# Patient Record
Sex: Female | Born: 1960 | Race: Black or African American | Hispanic: No | Marital: Married | Smoking: Never smoker
Health system: Southern US, Community
[De-identification: ages and names within clinical notes are randomized; demographics above are authoritative.]

## PROBLEM LIST (undated history)

## (undated) DIAGNOSIS — F32A Depression, unspecified: Secondary | ICD-10-CM

## (undated) DIAGNOSIS — G500 Trigeminal neuralgia: Secondary | ICD-10-CM

## (undated) DIAGNOSIS — I10 Essential (primary) hypertension: Secondary | ICD-10-CM

## (undated) DIAGNOSIS — R519 Headache, unspecified: Secondary | ICD-10-CM

## (undated) DIAGNOSIS — D649 Anemia, unspecified: Secondary | ICD-10-CM

## (undated) DIAGNOSIS — F329 Major depressive disorder, single episode, unspecified: Secondary | ICD-10-CM

## (undated) DIAGNOSIS — K219 Gastro-esophageal reflux disease without esophagitis: Secondary | ICD-10-CM

## (undated) DIAGNOSIS — E079 Disorder of thyroid, unspecified: Secondary | ICD-10-CM

## (undated) HISTORY — PX: INDUCED ABORTION: SHX677

## (undated) HISTORY — DX: Major depressive disorder, single episode, unspecified: F32.9

## (undated) HISTORY — DX: Headache, unspecified: R51.9

## (undated) HISTORY — DX: Essential (primary) hypertension: I10

## (undated) HISTORY — PX: TONSILLECTOMY: SHX5217

## (undated) HISTORY — DX: Disorder of thyroid, unspecified: E07.9

## (undated) HISTORY — DX: Depression, unspecified: F32.A

## (undated) HISTORY — DX: Gastro-esophageal reflux disease without esophagitis: K21.9

## (undated) HISTORY — DX: Anemia, unspecified: D64.9

## (undated) HISTORY — DX: Trigeminal neuralgia: G50.0

---

## 1998-01-19 ENCOUNTER — Ambulatory Visit (HOSPITAL_COMMUNITY): Admission: RE | Admit: 1998-01-19 | Discharge: 1998-01-19 | Payer: Self-pay | Admitting: Family Medicine

## 1998-02-21 ENCOUNTER — Other Ambulatory Visit: Admission: RE | Admit: 1998-02-21 | Discharge: 1998-02-21 | Payer: Self-pay | Admitting: Family Medicine

## 1998-09-18 ENCOUNTER — Other Ambulatory Visit: Admission: RE | Admit: 1998-09-18 | Discharge: 1998-09-18 | Payer: Self-pay | Admitting: Family Medicine

## 2000-05-21 ENCOUNTER — Other Ambulatory Visit: Admission: RE | Admit: 2000-05-21 | Discharge: 2000-05-21 | Payer: Self-pay | Admitting: Family Medicine

## 2001-10-20 ENCOUNTER — Other Ambulatory Visit: Admission: RE | Admit: 2001-10-20 | Discharge: 2001-10-20 | Payer: Self-pay | Admitting: Obstetrics and Gynecology

## 2001-11-09 ENCOUNTER — Encounter: Admission: RE | Admit: 2001-11-09 | Discharge: 2001-12-16 | Payer: Self-pay | Admitting: Internal Medicine

## 2003-02-27 ENCOUNTER — Other Ambulatory Visit: Admission: RE | Admit: 2003-02-27 | Discharge: 2003-02-27 | Payer: Self-pay | Admitting: Family Medicine

## 2003-06-10 ENCOUNTER — Ambulatory Visit (HOSPITAL_COMMUNITY): Admission: RE | Admit: 2003-06-10 | Discharge: 2003-06-10 | Payer: Self-pay | Admitting: Emergency Medicine

## 2003-06-10 ENCOUNTER — Emergency Department (HOSPITAL_COMMUNITY): Admission: EM | Admit: 2003-06-10 | Discharge: 2003-06-10 | Payer: Self-pay | Admitting: Emergency Medicine

## 2003-10-02 ENCOUNTER — Emergency Department (HOSPITAL_COMMUNITY): Admission: EM | Admit: 2003-10-02 | Discharge: 2003-10-03 | Payer: Self-pay | Admitting: Emergency Medicine

## 2004-04-23 ENCOUNTER — Other Ambulatory Visit: Admission: RE | Admit: 2004-04-23 | Discharge: 2004-04-23 | Payer: Self-pay | Admitting: Family Medicine

## 2004-12-11 ENCOUNTER — Ambulatory Visit: Payer: Self-pay | Admitting: Family Medicine

## 2004-12-11 ENCOUNTER — Other Ambulatory Visit: Admission: RE | Admit: 2004-12-11 | Discharge: 2004-12-11 | Payer: Self-pay | Admitting: Family Medicine

## 2006-01-15 ENCOUNTER — Other Ambulatory Visit: Admission: RE | Admit: 2006-01-15 | Discharge: 2006-01-15 | Payer: Self-pay | Admitting: Family Medicine

## 2006-01-15 ENCOUNTER — Ambulatory Visit: Payer: Self-pay | Admitting: Family Medicine

## 2006-01-15 ENCOUNTER — Encounter: Payer: Self-pay | Admitting: Family Medicine

## 2006-01-27 ENCOUNTER — Ambulatory Visit: Payer: Self-pay | Admitting: Family Medicine

## 2006-02-05 ENCOUNTER — Ambulatory Visit: Payer: Self-pay | Admitting: Family Medicine

## 2006-08-05 ENCOUNTER — Ambulatory Visit: Payer: Self-pay | Admitting: Internal Medicine

## 2006-08-10 ENCOUNTER — Ambulatory Visit: Payer: Self-pay | Admitting: Internal Medicine

## 2007-04-14 ENCOUNTER — Emergency Department (HOSPITAL_COMMUNITY): Admission: EM | Admit: 2007-04-14 | Discharge: 2007-04-14 | Payer: Self-pay | Admitting: Emergency Medicine

## 2007-11-25 ENCOUNTER — Ambulatory Visit: Payer: Self-pay | Admitting: Family Medicine

## 2007-11-25 ENCOUNTER — Encounter: Payer: Self-pay | Admitting: Family Medicine

## 2007-11-25 ENCOUNTER — Other Ambulatory Visit: Admission: RE | Admit: 2007-11-25 | Discharge: 2007-11-25 | Payer: Self-pay | Admitting: Family Medicine

## 2007-11-25 DIAGNOSIS — G500 Trigeminal neuralgia: Secondary | ICD-10-CM | POA: Insufficient documentation

## 2007-11-25 DIAGNOSIS — R002 Palpitations: Secondary | ICD-10-CM | POA: Insufficient documentation

## 2007-11-26 ENCOUNTER — Encounter (INDEPENDENT_AMBULATORY_CARE_PROVIDER_SITE_OTHER): Payer: Self-pay | Admitting: *Deleted

## 2007-11-30 ENCOUNTER — Encounter (INDEPENDENT_AMBULATORY_CARE_PROVIDER_SITE_OTHER): Payer: Self-pay | Admitting: *Deleted

## 2007-12-01 ENCOUNTER — Ambulatory Visit: Payer: Self-pay

## 2007-12-09 ENCOUNTER — Encounter: Payer: Self-pay | Admitting: Family Medicine

## 2007-12-16 ENCOUNTER — Ambulatory Visit: Payer: Self-pay | Admitting: Family Medicine

## 2007-12-16 LAB — CONVERTED CEMR LAB
Basophils Relative: 0.5 % (ref 0.0–1.0)
Eosinophils Relative: 5.2 % — ABNORMAL HIGH (ref 0.0–5.0)
HCT: 32.9 % — ABNORMAL LOW (ref 36.0–46.0)
Hemoglobin: 11.2 g/dL — ABNORMAL LOW (ref 12.0–15.0)
Lymphocytes Relative: 31.1 % (ref 12.0–46.0)
MCHC: 34 g/dL (ref 30.0–36.0)
MCV: 87.5 fL (ref 78.0–100.0)
Monocytes Relative: 9.8 % (ref 3.0–12.0)
Neutrophils Relative %: 53.4 % (ref 43.0–77.0)
Platelets: 103 10*3/uL — ABNORMAL LOW (ref 150–400)
RBC: 3.76 M/uL — ABNORMAL LOW (ref 3.87–5.11)
RDW: 13.1 % (ref 11.5–14.6)
WBC: 3.3 10*3/uL — ABNORMAL LOW (ref 4.5–10.5)

## 2007-12-22 ENCOUNTER — Encounter: Payer: Self-pay | Admitting: Internal Medicine

## 2007-12-22 ENCOUNTER — Telehealth (INDEPENDENT_AMBULATORY_CARE_PROVIDER_SITE_OTHER): Payer: Self-pay | Admitting: *Deleted

## 2007-12-28 ENCOUNTER — Encounter (INDEPENDENT_AMBULATORY_CARE_PROVIDER_SITE_OTHER): Payer: Self-pay | Admitting: *Deleted

## 2007-12-30 ENCOUNTER — Telehealth (INDEPENDENT_AMBULATORY_CARE_PROVIDER_SITE_OTHER): Payer: Self-pay | Admitting: *Deleted

## 2008-01-04 ENCOUNTER — Ambulatory Visit: Payer: Self-pay | Admitting: Internal Medicine

## 2008-01-07 ENCOUNTER — Encounter: Payer: Self-pay | Admitting: Internal Medicine

## 2008-01-08 LAB — CONVERTED CEMR LAB
Basophils Relative: 1.4 % — ABNORMAL HIGH (ref 0.0–1.0)
Eosinophils Relative: 5.4 % — ABNORMAL HIGH (ref 0.0–5.0)
Folate: 12 ng/mL
HCT: 33.8 % — ABNORMAL LOW (ref 36.0–46.0)
Hemoglobin: 11 g/dL — ABNORMAL LOW (ref 12.0–15.0)
Iron: 105 ug/dL (ref 42–145)
Lymphocytes Relative: 30.9 % (ref 12.0–46.0)
MCHC: 32.5 g/dL (ref 30.0–36.0)
MCV: 88 fL (ref 78.0–100.0)
Monocytes Relative: 12.2 % — ABNORMAL HIGH (ref 3.0–12.0)
Neutrophils Relative %: 50.1 % (ref 43.0–77.0)
Platelets: 132 10*3/uL — ABNORMAL LOW (ref 150–400)
RBC: 3.84 M/uL — ABNORMAL LOW (ref 3.87–5.11)
RDW: 12.8 % (ref 11.5–14.6)
Saturation Ratios: 35.2 % (ref 20.0–50.0)
Transferrin: 213.3 mg/dL (ref >=212.0)
Vitamin B-12: 320 pg/mL (ref 211–911)
WBC: 3.3 10*3/uL — ABNORMAL LOW (ref 4.5–10.5)

## 2008-01-10 ENCOUNTER — Encounter (INDEPENDENT_AMBULATORY_CARE_PROVIDER_SITE_OTHER): Payer: Self-pay | Admitting: *Deleted

## 2008-01-11 ENCOUNTER — Encounter (INDEPENDENT_AMBULATORY_CARE_PROVIDER_SITE_OTHER): Payer: Self-pay | Admitting: *Deleted

## 2008-01-11 ENCOUNTER — Ambulatory Visit: Payer: Self-pay | Admitting: Internal Medicine

## 2008-01-11 LAB — CONVERTED CEMR LAB
OCCULT 1: NEGATIVE
OCCULT 2: NEGATIVE
OCCULT 3: NEGATIVE

## 2008-01-17 ENCOUNTER — Encounter (INDEPENDENT_AMBULATORY_CARE_PROVIDER_SITE_OTHER): Payer: Self-pay | Admitting: *Deleted

## 2008-01-21 ENCOUNTER — Ambulatory Visit: Payer: Self-pay | Admitting: Family Medicine

## 2008-01-21 ENCOUNTER — Telehealth (INDEPENDENT_AMBULATORY_CARE_PROVIDER_SITE_OTHER): Payer: Self-pay | Admitting: *Deleted

## 2008-01-21 DIAGNOSIS — D7289 Other specified disorders of white blood cells: Secondary | ICD-10-CM

## 2008-01-21 LAB — CONVERTED CEMR LAB
Basophils Absolute: 0 10*3/uL (ref 0.0–0.1)
Basophils Relative: 0.3 % (ref 0.0–1.0)
Eosinophils Absolute: 0.1 10*3/uL (ref 0.0–0.7)
Eosinophils Relative: 3.7 % (ref 0.0–5.0)
HCT: 34 % — ABNORMAL LOW (ref 36.0–46.0)
Hemoglobin: 11.2 g/dL — ABNORMAL LOW (ref 12.0–15.0)
Lymphocytes Relative: 31 % (ref 12.0–46.0)
MCHC: 33 g/dL (ref 30.0–36.0)
MCV: 87.4 fL (ref 78.0–100.0)
Monocytes Absolute: 0.3 10*3/uL (ref 0.1–1.0)
Monocytes Relative: 11.1 % (ref 3.0–12.0)
Neutro Abs: 1.5 10*3/uL (ref 1.4–7.7)
Neutrophils Relative %: 53.9 % (ref 43.0–77.0)
Platelets: 121 10*3/uL — ABNORMAL LOW (ref 150–400)
RBC: 3.89 M/uL (ref 3.87–5.11)
RDW: 12.4 % (ref 11.5–14.6)
WBC: 2.7 10*3/uL — ABNORMAL LOW (ref 4.5–10.5)

## 2008-01-27 ENCOUNTER — Telehealth (INDEPENDENT_AMBULATORY_CARE_PROVIDER_SITE_OTHER): Payer: Self-pay | Admitting: *Deleted

## 2008-02-21 ENCOUNTER — Ambulatory Visit: Payer: Self-pay | Admitting: Hematology

## 2008-02-21 ENCOUNTER — Telehealth (INDEPENDENT_AMBULATORY_CARE_PROVIDER_SITE_OTHER): Payer: Self-pay | Admitting: *Deleted

## 2008-03-15 ENCOUNTER — Encounter: Payer: Self-pay | Admitting: Family Medicine

## 2008-03-15 LAB — CBC & DIFF AND RETIC
Basophils Absolute: 0 10*3/uL (ref 0.0–0.1)
Eosinophils Absolute: 0.1 10*3/uL (ref 0.0–0.5)
HCT: 34.2 % — ABNORMAL LOW (ref 34.8–46.6)
HGB: 11.7 g/dL (ref 11.6–15.9)
LYMPH%: 26 % (ref 14.0–48.0)
MCV: 86 fL (ref 81.0–101.0)
MONO%: 8.9 % (ref 0.0–13.0)
NEUT#: 2 10*3/uL (ref 1.5–6.5)
NEUT%: 62.3 % (ref 39.6–76.8)
Platelets: 158 10*3/uL (ref 145–400)
RBC: 3.98 10*6/uL (ref 3.70–5.32)

## 2008-03-15 LAB — MORPHOLOGY: PLT EST: ADEQUATE

## 2008-03-17 LAB — IRON AND TIBC
%SAT: 22 % (ref 20–55)
TIBC: 290 ug/dL (ref 250–470)
UIBC: 227 ug/dL

## 2008-03-17 LAB — COMPREHENSIVE METABOLIC PANEL
Albumin: 4.3 g/dL (ref 3.5–5.2)
BUN: 14 mg/dL (ref 6–23)
CO2: 24 mEq/L (ref 19–32)
Calcium: 8.7 mg/dL (ref 8.4–10.5)
Chloride: 106 mEq/L (ref 96–112)
Glucose, Bld: 85 mg/dL (ref 70–99)
Potassium: 3.8 mEq/L (ref 3.5–5.3)
Total Protein: 7.3 g/dL (ref 6.0–8.3)

## 2008-03-17 LAB — APTT: aPTT: 32 seconds (ref 24–37)

## 2008-03-17 LAB — SPEP & IFE WITH QIG
Alpha-1-Globulin: 2.9 % (ref 2.9–4.9)
Alpha-2-Globulin: 8.4 % (ref 7.1–11.8)
Beta Globulin: 5.1 % (ref 4.7–7.2)
Gamma Globulin: 22.3 % — ABNORMAL HIGH (ref 11.1–18.8)
IgG (Immunoglobin G), Serum: 1610 mg/dL (ref 694–1618)

## 2008-03-17 LAB — ANA: Anti Nuclear Antibody(ANA): NEGATIVE

## 2008-03-17 LAB — VITAMIN B12: Vitamin B-12: 346 pg/mL (ref 211–911)

## 2008-03-17 LAB — PROTHROMBIN TIME
INR: 1.1 (ref 0.0–1.5)
Prothrombin Time: 14.5 seconds (ref 11.6–15.2)

## 2008-03-17 LAB — FOLATE: Folate: >20 ng/mL

## 2008-05-11 ENCOUNTER — Ambulatory Visit: Payer: Self-pay | Admitting: Hematology

## 2008-05-15 LAB — CBC WITH DIFFERENTIAL/PLATELET
Basophils Absolute: 0 10*3/uL (ref 0.0–0.1)
Eosinophils Absolute: 0.1 10*3/uL (ref 0.0–0.5)
HCT: 35.4 % (ref 34.8–46.6)
HGB: 12 g/dL (ref 11.6–15.9)
MCV: 86.5 fL (ref 81.0–101.0)
NEUT#: 1.5 10*3/uL (ref 1.5–6.5)
NEUT%: 51.8 % (ref 39.6–76.8)
RDW: 13.9 % (ref 11.3–14.5)
lymph#: 0.9 10*3/uL (ref 0.9–3.3)

## 2008-05-15 LAB — MORPHOLOGY: PLT EST: DECREASED

## 2008-07-13 ENCOUNTER — Ambulatory Visit: Payer: Self-pay | Admitting: Hematology

## 2008-07-20 ENCOUNTER — Encounter: Payer: Self-pay | Admitting: Family Medicine

## 2008-09-12 ENCOUNTER — Ambulatory Visit: Payer: Self-pay | Admitting: Hematology

## 2008-11-27 ENCOUNTER — Ambulatory Visit: Payer: Self-pay | Admitting: Family Medicine

## 2008-11-28 ENCOUNTER — Encounter: Payer: Self-pay | Admitting: Family Medicine

## 2008-12-05 ENCOUNTER — Encounter: Payer: Self-pay | Admitting: Family Medicine

## 2008-12-05 ENCOUNTER — Other Ambulatory Visit: Admission: RE | Admit: 2008-12-05 | Discharge: 2008-12-05 | Payer: Self-pay | Admitting: Family Medicine

## 2008-12-05 ENCOUNTER — Ambulatory Visit: Payer: Self-pay | Admitting: Family Medicine

## 2008-12-05 LAB — CONVERTED CEMR LAB
Bilirubin Urine: NEGATIVE
Blood in Urine, dipstick: NEGATIVE
Glucose, Urine, Semiquant: NEGATIVE
Ketones, urine, test strip: NEGATIVE
Nitrite: NEGATIVE
Protein, U semiquant: NEGATIVE
Specific Gravity, Urine: <1.005
Urobilinogen, UA: NEGATIVE
WBC Urine, dipstick: NEGATIVE
pH: 7.5

## 2008-12-06 ENCOUNTER — Telehealth (INDEPENDENT_AMBULATORY_CARE_PROVIDER_SITE_OTHER): Payer: Self-pay | Admitting: *Deleted

## 2008-12-06 ENCOUNTER — Encounter (INDEPENDENT_AMBULATORY_CARE_PROVIDER_SITE_OTHER): Payer: Self-pay | Admitting: *Deleted

## 2008-12-06 LAB — CONVERTED CEMR LAB
ALT: 20 units/L (ref 0–35)
AST: 19 units/L (ref 0–37)
Albumin: 3.9 g/dL (ref 3.5–5.2)
Alkaline Phosphatase: 31 units/L — ABNORMAL LOW (ref 39–117)
BUN: 11 mg/dL (ref 6–23)
Basophils Absolute: 0 10*3/uL (ref 0.0–0.1)
Basophils Relative: 0.1 % (ref 0.0–3.0)
Bilirubin, Direct: 0.1 mg/dL (ref 0.0–0.3)
CO2: 32 meq/L (ref 19–32)
Calcium: 8.8 mg/dL (ref 8.4–10.5)
Chloride: 105 meq/L (ref 96–112)
Cholesterol: 160 mg/dL (ref 0–200)
Creatinine, Ser: 0.9 mg/dL (ref 0.4–1.2)
Eosinophils Absolute: 0.2 10*3/uL (ref 0.0–0.7)
Eosinophils Relative: 6.1 % — ABNORMAL HIGH (ref 0.0–5.0)
GFR calc non Af Amer: 85.49 mL/min (ref >60)
Glucose, Bld: 77 mg/dL (ref 70–99)
HCT: 34.6 % — ABNORMAL LOW (ref 36.0–46.0)
HDL: 56.7 mg/dL (ref >39.00)
Hemoglobin: 11.9 g/dL — ABNORMAL LOW (ref 12.0–15.0)
LDL Cholesterol: 94 mg/dL (ref 0–99)
Lymphocytes Relative: 27.1 % (ref 12.0–46.0)
Lymphs Abs: 0.9 10*3/uL (ref 0.7–4.0)
MCHC: 34.4 g/dL (ref 30.0–36.0)
MCV: 86.5 fL (ref 78.0–100.0)
Monocytes Absolute: 0.3 10*3/uL (ref 0.1–1.0)
Monocytes Relative: 10.4 % (ref 3.0–12.0)
Neutro Abs: 1.8 10*3/uL (ref 1.4–7.7)
Neutrophils Relative %: 56.3 % (ref 43.0–77.0)
Platelets: 117 10*3/uL — ABNORMAL LOW (ref 150.0–400.0)
Potassium: 3.7 meq/L (ref 3.5–5.1)
RBC: 4 M/uL (ref 3.87–5.11)
RDW: 12.9 % (ref 11.5–14.6)
Sodium: 141 meq/L (ref 135–145)
TSH: 0.4 microintl units/mL (ref 0.35–5.50)
Total Bilirubin: 0.9 mg/dL (ref 0.3–1.2)
Total CHOL/HDL Ratio: 3
Total Protein: 7.3 g/dL (ref 6.0–8.3)
Triglycerides: 48 mg/dL (ref 0.0–149.0)
VLDL: 9.6 mg/dL (ref 0.0–40.0)
WBC: 3.2 10*3/uL — ABNORMAL LOW (ref 4.5–10.5)

## 2008-12-07 ENCOUNTER — Encounter (INDEPENDENT_AMBULATORY_CARE_PROVIDER_SITE_OTHER): Payer: Self-pay | Admitting: *Deleted

## 2008-12-12 ENCOUNTER — Encounter: Payer: Self-pay | Admitting: Family Medicine

## 2008-12-12 LAB — HM MAMMOGRAPHY: HM Mammogram: NORMAL

## 2009-06-19 ENCOUNTER — Telehealth: Payer: Self-pay | Admitting: Family Medicine

## 2009-06-20 ENCOUNTER — Ambulatory Visit: Payer: Self-pay | Admitting: Family Medicine

## 2009-06-20 ENCOUNTER — Telehealth: Payer: Self-pay | Admitting: Family Medicine

## 2009-06-20 ENCOUNTER — Telehealth (INDEPENDENT_AMBULATORY_CARE_PROVIDER_SITE_OTHER): Payer: Self-pay | Admitting: *Deleted

## 2009-08-11 HISTORY — PX: COLONOSCOPY: SHX174

## 2009-11-29 ENCOUNTER — Encounter (INDEPENDENT_AMBULATORY_CARE_PROVIDER_SITE_OTHER): Payer: Self-pay | Admitting: *Deleted

## 2009-11-29 ENCOUNTER — Other Ambulatory Visit: Admission: RE | Admit: 2009-11-29 | Discharge: 2009-11-29 | Payer: Self-pay | Admitting: Family Medicine

## 2009-11-29 ENCOUNTER — Ambulatory Visit: Payer: Self-pay | Admitting: Family Medicine

## 2009-11-29 DIAGNOSIS — K921 Melena: Secondary | ICD-10-CM | POA: Insufficient documentation

## 2009-12-04 ENCOUNTER — Ambulatory Visit: Payer: Self-pay | Admitting: Family Medicine

## 2009-12-05 ENCOUNTER — Telehealth (INDEPENDENT_AMBULATORY_CARE_PROVIDER_SITE_OTHER): Payer: Self-pay | Admitting: *Deleted

## 2009-12-05 ENCOUNTER — Encounter (INDEPENDENT_AMBULATORY_CARE_PROVIDER_SITE_OTHER): Payer: Self-pay | Admitting: *Deleted

## 2009-12-10 ENCOUNTER — Ambulatory Visit: Payer: Self-pay | Admitting: Gastroenterology

## 2009-12-17 ENCOUNTER — Telehealth (INDEPENDENT_AMBULATORY_CARE_PROVIDER_SITE_OTHER): Payer: Self-pay | Admitting: *Deleted

## 2009-12-24 ENCOUNTER — Ambulatory Visit: Payer: Self-pay | Admitting: Gastroenterology

## 2009-12-24 LAB — HM COLONOSCOPY

## 2010-01-02 ENCOUNTER — Encounter: Payer: Self-pay | Admitting: Family Medicine

## 2010-01-09 ENCOUNTER — Ambulatory Visit: Payer: Self-pay | Admitting: Family Medicine

## 2010-01-10 ENCOUNTER — Ambulatory Visit: Payer: Self-pay | Admitting: Hematology & Oncology

## 2010-01-10 LAB — CONVERTED CEMR LAB
ALT: 22 units/L (ref 0–35)
AST: 18 units/L (ref 0–37)
Albumin: 3.8 g/dL (ref 3.5–5.2)
Alkaline Phosphatase: 24 units/L — ABNORMAL LOW (ref 39–117)
BUN: 9 mg/dL (ref 6–23)
Basophils Absolute: 0 10*3/uL (ref 0.0–0.1)
Basophils Absolute: 0 10*3/uL (ref 0.0–0.1)
Basophils Relative: 0 % (ref 0–1)
Basophils Relative: 0.7 % (ref 0.0–3.0)
Bilirubin, Direct: 0 mg/dL (ref 0.0–0.3)
CO2: 31 meq/L (ref 19–32)
Calcium: 9.1 mg/dL (ref 8.4–10.5)
Chloride: 106 meq/L (ref 96–112)
Cholesterol: 144 mg/dL (ref 0–200)
Creatinine, Ser: 1 mg/dL (ref 0.4–1.2)
Eosinophils Absolute: 0.1 10*3/uL (ref 0.0–0.7)
Eosinophils Absolute: 0.1 10*3/uL (ref 0.0–0.7)
Eosinophils Relative: 3 % (ref 0–5)
Eosinophils Relative: 4.2 % (ref 0.0–5.0)
Ferritin: 22.3 ng/mL (ref 10.0–291.0)
GFR calc non Af Amer: 75.39 mL/min (ref >60)
Glucose, Bld: 86 mg/dL (ref 70–99)
HCT: 34 % — ABNORMAL LOW (ref 36.0–46.0)
HCT: 35.3 % — ABNORMAL LOW (ref 36.0–46.0)
HDL: 57.2 mg/dL (ref >39.00)
Hemoglobin: 11.1 g/dL — ABNORMAL LOW (ref 12.0–15.0)
Hemoglobin: 11.7 g/dL — ABNORMAL LOW (ref 12.0–15.0)
Iron: 80 ug/dL (ref 42–145)
LDL Cholesterol: 74 mg/dL (ref 0–99)
Lymphocytes Relative: 28 % (ref 12–46)
Lymphocytes Relative: 31.3 % (ref 12.0–46.0)
Lymphs Abs: 0.7 10*3/uL (ref 0.7–4.0)
Lymphs Abs: 0.7 10*3/uL (ref 0.7–4.0)
MCHC: 32.7 g/dL (ref 30.0–36.0)
MCHC: 33.1 g/dL (ref 30.0–36.0)
MCV: 85.1 fL (ref 78.0–100.0)
MCV: 87.4 fL (ref 78.0–100.0)
Monocytes Absolute: 0.2 10*3/uL (ref 0.1–1.0)
Monocytes Absolute: 0.3 10*3/uL (ref 0.1–1.0)
Monocytes Relative: 11 % (ref 3.0–12.0)
Monocytes Relative: 13 % — ABNORMAL HIGH (ref 3–12)
Neutro Abs: 1.1 10*3/uL — ABNORMAL LOW (ref 1.4–7.7)
Neutro Abs: 1.4 10*3/uL — ABNORMAL LOW (ref 1.7–7.7)
Neutrophils Relative %: 52.8 % (ref 43.0–77.0)
Neutrophils Relative %: 56 % (ref 43–77)
Platelets: 121 10*3/uL — ABNORMAL LOW (ref 150–400)
Platelets: 79 10*3/uL — ABNORMAL LOW (ref 150.0–400.0)
Potassium: 4.9 meq/L (ref 3.5–5.1)
RBC: 3.89 M/uL (ref 3.87–5.11)
RBC: 4.15 M/uL (ref 3.87–5.11)
RDW: 13.9 % (ref 11.5–14.6)
RDW: 13.9 % (ref 11.5–15.5)
Saturation Ratios: 26.9 % (ref 20.0–50.0)
Sodium: 141 meq/L (ref 135–145)
TSH: 0.43 microintl units/mL (ref 0.35–5.50)
Total Bilirubin: 0.6 mg/dL (ref 0.3–1.2)
Total CHOL/HDL Ratio: 3
Total Protein: 7.8 g/dL (ref 6.0–8.3)
Transferrin: 212.3 mg/dL (ref 212.0–360.0)
Triglycerides: 63 mg/dL (ref 0.0–149.0)
VLDL: 12.6 mg/dL (ref 0.0–40.0)
Vitamin B-12: 383 pg/mL (ref 211–911)
WBC: 2.1 10*3/uL — ABNORMAL LOW (ref 4.5–10.5)
WBC: 2.4 10*3/uL — ABNORMAL LOW (ref 4.0–10.5)

## 2010-01-16 ENCOUNTER — Encounter (INDEPENDENT_AMBULATORY_CARE_PROVIDER_SITE_OTHER): Payer: Self-pay | Admitting: *Deleted

## 2010-02-12 ENCOUNTER — Ambulatory Visit: Payer: Self-pay | Admitting: Family Medicine

## 2010-03-12 ENCOUNTER — Ambulatory Visit: Payer: Self-pay | Admitting: Hematology & Oncology

## 2010-03-14 ENCOUNTER — Encounter: Payer: Self-pay | Admitting: Family Medicine

## 2010-03-14 LAB — CBC WITH DIFFERENTIAL (CANCER CENTER ONLY)
HCT: 35.3 % (ref 34.8–46.6)
HGB: 11.6 g/dL (ref 11.6–15.9)
MCH: 28.1 pg (ref 26.0–34.0)
MCHC: 32.9 g/dL (ref 32.0–36.0)
MCV: 85 fL (ref 81–101)
Platelets: 128 10*3/uL — ABNORMAL LOW (ref 145–400)
RBC: 4.15 10*6/uL (ref 3.70–5.32)
RDW: 12.5 % (ref 10.5–14.6)
WBC: 2.2 10*3/uL — ABNORMAL LOW (ref 3.9–10.0)

## 2010-03-14 LAB — MANUAL DIFFERENTIAL (CHCC SATELLITE)
ALC: 0.6 10*3/uL (ref 0.6–2.2)
ANC (CHCC HP manual diff): 1.1 10*3/uL — ABNORMAL LOW (ref 1.5–6.7)
Eos: 2 % (ref 0–7)
LYMPH: 26 % (ref 14–48)
MONO: 20 % — ABNORMAL HIGH (ref 0–13)
PLT EST ~~LOC~~: DECREASED
SEG: 52 % (ref 40–75)

## 2010-03-14 LAB — CHCC SATELLITE - SMEAR

## 2010-03-15 LAB — ANTI-NUCLEAR AB-TITER (ANA TITER): ANA Titer 1: 1:40 {titer} — ABNORMAL HIGH

## 2010-03-15 LAB — RHEUMATOID FACTOR: Rheumatoid fact SerPl-aCnc: <20 IU/mL (ref 0–20)

## 2010-03-15 LAB — LACTATE DEHYDROGENASE: LDH: 127 U/L (ref 94–250)

## 2010-03-15 LAB — VITAMIN B12: Vitamin B-12: 745 pg/mL (ref 211–911)

## 2010-03-15 LAB — ANA: Anti Nuclear Antibody(ANA): POSITIVE — AB

## 2010-05-23 ENCOUNTER — Ambulatory Visit: Payer: Self-pay | Admitting: Hematology & Oncology

## 2010-05-24 ENCOUNTER — Encounter: Payer: Self-pay | Admitting: Family Medicine

## 2010-05-24 LAB — CHCC SATELLITE - SMEAR

## 2010-05-24 LAB — CBC WITH DIFFERENTIAL (CANCER CENTER ONLY)
BASO#: 0 10*3/uL (ref 0.0–0.2)
BASO%: 0.5 % (ref 0.0–2.0)
EOS%: 7.6 % — ABNORMAL HIGH (ref 0.0–7.0)
Eosinophils Absolute: 0.2 10*3/uL (ref 0.0–0.5)
HCT: 35.3 % (ref 34.8–46.6)
HGB: 11.8 g/dL (ref 11.6–15.9)
LYMPH#: 0.9 10*3/uL (ref 0.9–3.3)
LYMPH%: 36.5 % (ref 14.0–48.0)
MCH: 28.4 pg (ref 26.0–34.0)
MCHC: 33.2 g/dL (ref 32.0–36.0)
MCV: 85 fL (ref 81–101)
MONO#: 0.2 10*3/uL (ref 0.1–0.9)
MONO%: 8.2 % (ref 0.0–13.0)
NEUT#: 1.2 10*3/uL — ABNORMAL LOW (ref 1.5–6.5)
NEUT%: 47.2 % (ref 39.6–80.0)
Platelets: 126 10*3/uL — ABNORMAL LOW (ref 145–400)
RBC: 4.14 10*6/uL (ref 3.70–5.32)
RDW: 11.9 % (ref 10.5–14.6)
WBC: 2.4 10*3/uL — ABNORMAL LOW (ref 3.9–10.0)

## 2010-05-24 LAB — TECHNOLOGIST REVIEW CHCC SATELLITE

## 2010-05-31 ENCOUNTER — Ambulatory Visit: Payer: Self-pay | Admitting: Family Medicine

## 2010-08-21 ENCOUNTER — Ambulatory Visit: Payer: Self-pay | Admitting: Hematology & Oncology

## 2010-09-05 ENCOUNTER — Encounter: Payer: Self-pay | Admitting: Family Medicine

## 2010-09-05 LAB — CHCC SATELLITE - SMEAR

## 2010-09-05 LAB — CBC WITH DIFFERENTIAL (CANCER CENTER ONLY)
BASO#: 0 10*3/uL (ref 0.0–0.2)
BASO%: 0.3 % (ref 0.0–2.0)
EOS%: 2.7 % (ref 0.0–7.0)
Eosinophils Absolute: 0.1 10*3/uL (ref 0.0–0.5)
HCT: 35.2 % (ref 34.8–46.6)
HGB: 11.8 g/dL (ref 11.6–15.9)
LYMPH#: 0.9 10*3/uL (ref 0.9–3.3)
LYMPH%: 31.4 % (ref 14.0–48.0)
MCH: 28.6 pg (ref 26.0–34.0)
MCHC: 33.6 g/dL (ref 32.0–36.0)
MCV: 85 fL (ref 81–101)
MONO#: 0.2 10*3/uL (ref 0.1–0.9)
MONO%: 7 % (ref 0.0–13.0)
NEUT#: 1.6 10*3/uL (ref 1.5–6.5)
NEUT%: 58.6 % (ref 39.6–80.0)
Platelets: 157 10*3/uL (ref 145–400)
RBC: 4.13 10*6/uL (ref 3.70–5.32)
RDW: 12.2 % (ref 10.5–14.6)
WBC: 2.8 10*3/uL — ABNORMAL LOW (ref 3.9–10.0)

## 2010-09-05 LAB — TECHNOLOGIST REVIEW CHCC SATELLITE

## 2010-09-08 LAB — CONVERTED CEMR LAB
ALT: 17 units/L (ref 0–35)
AST: 18 units/L (ref 0–37)
Albumin: 3.9 g/dL (ref 3.5–5.2)
Alkaline Phosphatase: 24 units/L — ABNORMAL LOW (ref 39–117)
BUN: 12 mg/dL (ref 6–23)
Basophils Absolute: 0 10*3/uL (ref 0.0–0.1)
Basophils Relative: 0.1 % (ref 0.0–1.0)
Bilirubin, Direct: 0.1 mg/dL (ref 0.0–0.3)
CO2: 31 meq/L (ref 19–32)
Calcium: 9.1 mg/dL (ref 8.4–10.5)
Chloride: 106 meq/L (ref 96–112)
Cholesterol: 148 mg/dL (ref 0–200)
Creatinine, Ser: 0.9 mg/dL (ref 0.4–1.2)
Eosinophils Absolute: 0.1 10*3/uL (ref 0.0–0.7)
Eosinophils Relative: 4.4 % (ref 0.0–5.0)
GFR calc Af Amer: 86 mL/min
GFR calc non Af Amer: 71 mL/min
Glucose, Bld: 81 mg/dL (ref 70–99)
HCT: 37.5 % (ref 36.0–46.0)
HDL: 50.2 mg/dL (ref >39.0)
Hemoglobin: 12 g/dL (ref 12.0–15.0)
LDL Cholesterol: 86 mg/dL (ref 0–99)
Lymphocytes Relative: 29.4 % (ref 12.0–46.0)
MCHC: 32.8 g/dL (ref 30.0–36.0)
MCV: 90.8 fL (ref 78.0–100.0)
Monocytes Absolute: 0.3 10*3/uL (ref 0.1–1.0)
Monocytes Relative: 9.6 % (ref 3.0–12.0)
Neutro Abs: 1.6 10*3/uL (ref 1.4–7.7)
Neutrophils Relative %: 56.5 % (ref 43.0–77.0)
Pap Smear: NEGATIVE
Platelets: 126 10*3/uL — ABNORMAL LOW (ref 150–400)
Potassium: 3.5 meq/L (ref 3.5–5.1)
RBC: 4 M/uL (ref 3.87–5.11)
RDW: 13 % (ref 11.5–14.6)
Sodium: 139 meq/L (ref 135–145)
TSH: 0.6 microintl units/mL (ref 0.35–5.50)
Total Bilirubin: 0.7 mg/dL (ref 0.3–1.2)
Total CHOL/HDL Ratio: 2.9
Total Protein: 7.7 g/dL (ref 6.0–8.3)
Triglycerides: 59 mg/dL (ref 0–149)
VLDL: 12 mg/dL (ref 0–40)
WBC: 2.9 10*3/uL — ABNORMAL LOW (ref 4.5–10.5)

## 2010-09-12 NOTE — Letter (Signed)
Summary: [employer] Form/[employer]  [employer] Form/[employer]   Imported By: Lanelle Bal 02/25/2010 13:09:33  _____________________________________________________________________  External Attachment:    Type:   Image     Comment:   External Document

## 2010-09-12 NOTE — Letter (Signed)
Summary: Primary Care Consult Scheduled Letter  [facility] [place]  [address] [place], [place] [address]   Phone: [phone]  Fax: [phone]      11/29/2009 MRN: [id number]  [facility] [address] [place], [place]  [address]    Dear Ms. Bun,    We have scheduled an appointment for you.  At the recommendation of Dr. Loreen Freud, we have scheduled you for a Screening Colonoscopy with Dr. Jarold Motto of [facility].  Your initial Pre-Visit with a Nurse is on 12-10-2009 at 2:30pm.  Your Colonoscopy is on 12-24-2009 arrive by 1:00pm.  Their address is [address], [place] [address]. The office phone number is [phone].  If this appointment day and time is not convenient for you, please feel free to call the office of the doctor you are being referred to at the number listed above and reschedule the appointment.    It is important for you to keep your scheduled appointments. We are here to make sure you are given good patient care.   Thank you,    Renee, Patient Care Coordinator [facility]

## 2010-09-12 NOTE — Progress Notes (Signed)
Summary: Lab Results   Phone Note Outgoing Call   Call placed by: Army Fossa CMA,  December 05, 2009 12:01 PM Reason for Call: Discuss lab or test results Summary of Call: Regarding lab results, LMTCB:  CBC abnormal---  + anemia---take slow fe 1 daily recheck labs 1 month---cbcd, ibc, ferritin, b12  285.9  288.8 Signed by Loreen Freud DO on 12/04/2009 at 8:50 PM  Follow-up for Phone Call        Pt is aware, will schedule labs in 1 month. Army Fossa CMA  December 06, 2009 11:23 AM     New/Updated Medications: SLOW FE 160 (50 FE) MG CR-TABS (FERROUS SULFATE DRIED) take 1 by mouth daily.

## 2010-09-12 NOTE — Letter (Signed)
Summary: [facility]  [facility]   Imported By: Lanelle Bal 06/27/2010 12:37:17  _____________________________________________________________________  External Attachment:    Type:   Image     Comment:   External Document

## 2010-09-12 NOTE — Consult Note (Signed)
[facility]   Imported By: Lanelle Bal 03/29/2010 11:35:01  _____________________________________________________________________  External Attachment:    Type:   Image     Comment:   External Document

## 2010-09-12 NOTE — Letter (Signed)
Summary: Results Follow up Letter  [place] [facility]  [address] [place], [place] [address]   Phone: [phone]  Fax: [phone]    12/05/2009 MRN: [id number]  [facility] [address] [place], [place]  [address]  Dear [name],  The following are the results of your recent test(s):  Test         Result    Pap Smear:        Normal __X___  Not Normal _____ Comments: ______________________________________________________ Cholesterol: LDL(Bad cholesterol):         Your goal is less than:         HDL (Good cholesterol):       Your goal is more than: Comments:  ______________________________________________________ Mammogram:        Normal _____  Not Normal _____ Comments:  ___________________________________________________________________ Hemoccult:        Normal _____  Not normal _______ Comments:    _____________________________________________________________________ Other Tests:    We routinely do not discuss normal results over the telephone.  If you desire a copy of the results, or you have any questions about this information we can discuss them at your next office visit.   Sincerely,    Army Fossa CMA  December 05, 2009 7:53 AM

## 2010-09-12 NOTE — Letter (Signed)
Summary: [facility] Instructions  [facility]  [address] [place], [place] [address]   Phone: [phone]  Fax: [phone]       Jackie Casey    1961/03/03    MRN: [id number]        Procedure Day Dorna Bloom:  Duanne Limerick  12/24/09     Arrival Time:  1:00PM     Procedure Time:  2:00PM     Location of Procedure:                    _X _  [facility] (4th Floor)                        PREPARATION FOR COLONOSCOPY WITH MOVIPREP   Starting 5 days prior to your procedure 12/19/09 do not eat nuts, seeds, popcorn, corn, beans, peas,  salads, or any raw vegetables.  Do not take any fiber supplements (e.g. Metamucil, Citrucel, and Benefiber).  THE DAY BEFORE YOUR PROCEDURE         DATE: 12/23/09  DAY: SUNDAY  1.  Drink clear liquids the entire day-NO SOLID FOOD  2.  Do not drink anything colored red or purple.  Avoid juices with pulp.  No orange juice.  3.  Drink at least 64 oz. (8 glasses) of fluid/clear liquids during the day to prevent dehydration and help the prep work efficiently.  CLEAR LIQUIDS INCLUDE: Water Jello Ice Popsicles Tea (sugar ok, no milk/cream) Powdered fruit flavored drinks Coffee (sugar ok, no milk/cream) Gatorade Juice: apple, white grape, white cranberry  Lemonade Clear bullion, consomm, broth Carbonated beverages (any kind) Strained chicken noodle soup Hard Candy                             4.  In the morning, mix first dose of MoviPrep solution:    Empty 1 Pouch A and 1 Pouch B into the disposable container    Add lukewarm drinking water to the top line of the container. Mix to dissolve    Refrigerate (mixed solution should be used within 24 hrs)  5.  Begin drinking the prep at 5:00 p.m. The MoviPrep container is divided by 4 marks.   Every 15 minutes drink the solution down to the next mark (approximately 8 oz) until the full liter is complete.   6.  Follow completed prep with 16 oz of clear liquid of your choice (Nothing  red or purple).  Continue to drink clear liquids until bedtime.  7.  Before going to bed, mix second dose of MoviPrep solution:    Empty 1 Pouch A and 1 Pouch B into the disposable container    Add lukewarm drinking water to the top line of the container. Mix to dissolve    Refrigerate  THE DAY OF YOUR PROCEDURE      DATE: 12/24/09  DAY: MONDAY  Beginning at 9:00AM (5 hours before procedure):         1. Every 15 minutes, drink the solution down to the next mark (approx 8 oz) until the full liter is complete.  2. Follow completed prep with 16 oz. of clear liquid of your choice.    3. You may drink clear liquids until 12:00PM (2 HOURS BEFORE PROCEDURE).   MEDICATION INSTRUCTIONS  Unless otherwise instructed, you should take regular prescription medications with a small sip of water   as early as possible the morning  of your procedure.          OTHER INSTRUCTIONS  You will need a responsible adult at least 51 years of age to accompany you and drive you home.   This person must remain in the waiting room during your procedure.  Wear loose fitting clothing that is easily removed.  Leave jewelry and other valuables at home.  However, you may wish to bring a book to read or  an iPod/MP3 player to listen to music as you wait for your procedure to start.  Remove all body piercing jewelry and leave at home.  Total time from sign-in until discharge is approximately 2-3 hours.  You should go home directly after your procedure and rest.  You can resume normal activities the  day after your procedure.  The day of your procedure you should not:   Drive   Make legal decisions   Operate machinery   Drink alcohol   Return to work  You will receive specific instructions about eating, activities and medications before you leave.    The above instructions have been reviewed and explained to me by   Wyona Almas RN  Dec 10, 2009 3:02 PM     I fully understand and can  verbalize these instructions _____________________________ Date _________

## 2010-09-12 NOTE — Procedures (Signed)
Summary: Colonoscopy  Patient: Jackie Casey Note: All result statuses are Final unless otherwise noted.  Tests: (1) Colonoscopy (COL)   COL Colonoscopy           [facility]ONE      [facility]     [address]     [place], [place]  [address]           COLONOSCOPY PROCEDURE REPORT           PATIENT:  Jackie Casey, Jackie Casey  MR#:  [id number]     BIRTHDATE:  07-20-1961, 49 yrs. old  GENDER:  female     ENDOSCOPIST:  Vania Rea. Jarold Motto, MD, [facility]     REF. BY:  Loreen Freud, DO     PROCEDURE DATE:  12/24/2009     PROCEDURE:  Average-risk screening colonoscopy     G0121     ASA CLASS:  Class I     INDICATIONS:  FOBT positive stool     MEDICATIONS:   Fentanyl 75 mcg IV, Versed 7 mg IV           DESCRIPTION OF PROCEDURE:   After the risks benefits and     alternatives of the procedure were thoroughly explained, informed     consent was obtained.  Digital rectal exam was performed and     revealed no abnormalities.   The LB CF-[id number] endoscope     was introduced through the anus and advanced to the cecum, which     was identified by both the appendix and ileocecal valve, without     limitations.  The quality of the prep was good, using MiraLax.     The instrument was then slowly withdrawn as the colon was fully     examined.     <<PROCEDUREIMAGES>>           FINDINGS:  No polyps or cancers were seen.  This was otherwise a     normal examination of the colon.   Retroflexed views in the rectum     revealed no abnormalities.    The scope was then withdrawn from     the patient and the procedure completed.           COMPLICATIONS:  None     ENDOSCOPIC IMPRESSION:     1) No polyps or cancers     2) Otherwise normal examination     PROBABLE + STOOL FROM ASA/NSAID USE.     RECOMMENDATIONS:     1) Continue current colorectal screening recommendations for     "routine risk" patients with a repeat colonoscopy in 10 years.     REPEAT EXAM:  No            ______________________________     Vania Rea. Jarold Motto, MD, [facility]           CC:           n.     eSIGNED:   Vania Rea. Patterson at 12/24/2009 02:37 PM           Reika, [place]anan, [id number]  Note: An exclamation mark (!) indicates a result that was not dispersed into the flowsheet. Document Creation Date: 12/24/2009 2:37 PM _______________________________________________________________________  (1) Order result status: Final Collection or observation date-time: 12/24/2009 14:33 Requested date-time:  Receipt date-time:  Reported date-time:  Referring Physician:   Ordering Physician: Sheryn Bison [phone]) Specimen Source:  Source: Launa Grill Order Number: [id number] Lab site:   Appended Document: Colonoscopy    Clinical  Lists Changes  Observations: Added new observation of COLONNXTDUE: 12/2019 (12/24/2009 16:00)

## 2010-09-12 NOTE — Miscellaneous (Signed)
Summary: [facility] Previsit/prep  Clinical Lists Changes  Medications: Added new medication of MOVIPREP 100 GM  SOLR (PEG-KCL-NACL-NASULF-NA ASC-C) As per prep instructions. - Signed Rx of MOVIPREP 100 GM  SOLR (PEG-KCL-NACL-NASULF-NA ASC-C) As per prep instructions.;  #1 x 0;  Signed;  Entered by: Wyona Almas RN;  Authorized by: Mardella Layman MD [facility];  Method used: Electronically [facility]  [address] [id number] [address] [place], [place]  [address], Ph: [phone] or [phone], Fax: [phone] Observations: Added new observation of ALLERGY REV: Done (12/10/2009 14:36) Added new observation of NKA: T (12/10/2009 14:36)    Prescriptions: MOVIPREP 100 GM  SOLR (PEG-KCL-NACL-NASULF-NA ASC-C) As per prep instructions.  #1 x 0   Entered by:   Wyona Almas RN   Authorized by:   Mardella Layman MD [facility]   Signed by:   Wyona Almas RN on 12/10/2009   Method used:   [facility] to        [facility]  [address] [id number]       [address]       [place], [place]  [address]       Ph: [phone] or [phone]       Fax: [phone]   RxID:   [id number]

## 2010-09-12 NOTE — Progress Notes (Signed)
Summary: prep ?'s  Phone Note Call from Patient Call back at Work Phone [phone]   Caller: Patient Call For: Dr. Jarold Motto Reason for Call: Talk to Nurse Summary of Call: prep ?'s Initial call taken by: Vallarie Mare,  Dec 17, 2009 11:08 AM  Follow-up for Phone Call        pt's questions were answered Follow-up by: Ezra Sites RN,  Dec 17, 2009 11:48 AM

## 2010-09-12 NOTE — Assessment & Plan Note (Signed)
Summary: FLU SHOT///SPH  Nurse Visit   Allergies: No Known Drug Allergies  Orders Added: 1)  Admin 1st Vaccine [[address]] 2)  Flu Vaccine 59yrs + [[address]] Flu Vaccine Consent Questions     Do you have a history of severe allergic reactions to this vaccine? no    Any prior history of allergic reactions to egg and/or gelatin? no    Do you have a sensitivity to the preservative Thimersol? no    Do you have a past history of Guillan-Barre Syndrome? no    Do you currently have an acute febrile illness? no    Have you ever had a severe reaction to latex? no    Vaccine information given and explained to patient? yes    Are you currently pregnant? no    Lot Number:[id number]   Exp Date:02/08/2011   Site Given  Left Deltoid IM .lbflu1

## 2010-09-12 NOTE — Assessment & Plan Note (Signed)
Summary: CPX/KDC   Vital Signs:  Patient profile:   50 year old female Height:      65.25 inches Weight:      141 pounds BMI:     23.37 Pulse rate:   76 / minute Pulse rhythm:   regular BP sitting:   100 / 64  (left arm) Cuff size:   regular  Vitals Entered By: Army Fossa CMA (November 29, 2009 1:04 PM) CC: Pt here for CPX, and pap   History of Present Illness: Pt here for cpe and pap.  No complaints.    Preventive Screening-Counseling & Management  Alcohol-Tobacco     Alcohol drinks/day: <1     Alcohol type: very rare     Smoking Status: never  Caffeine-Diet-Exercise     Caffeine use/day: <1     Does Patient Exercise: yes     Type of exercise: push ups,  walking, weights     Times/week: 2  Hep-HIV-STD-Contraception     HIV Risk: no     Dental Visit-last 6 months yes     Dental Care Counseling: not indicated; dental care within six months     SBE monthly: yes     SBE Education/Counseling: not indicated; SBE done regularly  Safety-Violence-Falls     Seat Belt Use: 100      Drug Use:  never.    Current Medications (verified): 1)  Lyrica 100 Mg  Caps (Pregabalin) .Marland Kitchen.. 1 By Mouth Two Times A Day 2)  Multivitamin/iron   Tabs (Multiple Vitamins-Iron) .... Qd 3)  Cvs Ibuprofen 200 Mg  Tabs (Ibuprofen) .... As Needed 4)  Baby Aspirin 81 Mg Chew (Aspirin) .... One Daily.  Allergies (verified): No Known Drug Allergies  Past History:  Past Medical History: Last updated: 11/25/2007 Trigeminal Neuralgia  Past Surgical History: Last updated: 11/25/2007 Tonsillectomy  Family History: Last updated: 11/25/2007 Family History of Asthma Family History Hypertension Family History of Stroke F 1st degree relative <60  Social History: Last updated: 11/25/2007 Occupation: Engineering geologist for [employer] Divorced Never Smoked Alcohol use-yes Drug use-no Regular exercise-yes  Risk Factors: Alcohol Use: <1 (11/29/2009) Caffeine Use: <1 (11/29/2009) Exercise: yes  (11/29/2009)  Risk Factors: Smoking Status: never (11/29/2009)  Family History: Reviewed history from 11/25/2007 and no changes required. Family History of Asthma Family History Hypertension Family History of Stroke F 1st degree relative <60  Social History: Reviewed history from 11/25/2007 and no changes required. 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 Eyes:  optho last year. ENT:  Denies decreased hearing, difficulty swallowing, ear discharge, earache, hoarseness, nasal congestion, nosebleeds, postnasal drainage, ringing in ears, sinus pressure, and sore throat. CV:  Denies bluish discoloration of lips or nails, chest pain or discomfort, difficulty breathing at night, difficulty breathing while lying down, fainting, fatigue, leg cramps with exertion, lightheadness, near fainting, palpitations, shortness of breath with exertion, swelling of feet, swelling of hands, and weight gain. Resp:  Denies chest discomfort, chest pain with inspiration, cough, coughing up blood, excessive snoring, hypersomnolence, morning headaches, pleuritic, shortness of breath, sputum productive, and wheezing. GI:  Denies abdominal pain, bloody stools, change in bowel habits, constipation, dark tarry stools, diarrhea, excessive appetite, gas, hemorrhoids, indigestion, loss of appetite, nausea,  vomiting, vomiting blood, and yellowish skin color. GU:  Denies abnormal vaginal bleeding, decreased libido, discharge, dysuria, genital sores, hematuria, incontinence, nocturia, urinary frequency, and urinary hesitancy. MS:  Denies joint pain, joint redness, joint swelling, loss of strength, low back pain, mid back pain, muscle aches,  muscle , cramps, muscle weakness, stiffness, and thoracic pain. Derm:  Denies changes in color of skin, changes in nail beds, dryness, excessive perspiration, flushing, hair loss, insect bite(s), itching, lesion(s), poor wound healing, and rash. Neuro:  Denies brief paralysis, difficulty with concentration, disturbances in coordination, falling down, headaches, inability to speak, memory loss, numbness, poor balance, seizures, sensation of room spinning, tingling, tremors, visual disturbances, and weakness. Psych:  Denies alternate hallucination ( auditory/visual), anxiety, depression, easily angered, easily tearful, irritability, mental problems, panic attacks, sense of great danger, suicidal thoughts/plans, thoughts of violence, unusual visions or sounds, and thoughts /plans of harming others. Endo:  Denies cold intolerance, excessive hunger, excessive thirst, excessive urination, heat intolerance, polyuria, and weight change. Heme:  Denies abnormal bruising, bleeding, enlarge lymph nodes, fevers, pallor, and skin discoloration. Allergy:  Denies hives or rash, itching eyes, persistent infections, seasonal allergies, and sneezing.  Physical Exam  General:  Well-developed,well-nourished,in no acute distress; alert,appropriate and cooperative throughout examination Head:  Normocephalic and atraumatic without obvious abnormalities. No apparent alopecia or balding. Eyes:  pupils equal, pupils round, pupils reactive to light, and no injection.   Ears:  External ear exam shows no significant lesions or deformities.  Otoscopic examination reveals clear canals, tympanic membranes are intact bilaterally without bulging, retraction, inflammation or discharge. Hearing is grossly normal bilaterally. Nose:  External nasal examination shows no deformity or inflammation. Nasal mucosa are pink and moist without lesions or exudates. Mouth:  Oral mucosa and oropharynx without lesions or exudates.  Teeth in good  repair. Neck:  No deformities, masses, or tenderness noted. Chest Wall:  No deformities, masses, or tenderness noted. Breasts:  No mass, nodules, thickening, tenderness, bulging, retraction, inflamation, nipple discharge or skin changes noted.   Lungs:  Normal respiratory effort, chest expands symmetrically. Lungs are clear to auscultation, no crackles or wheezes. Heart:  normal rate and no murmur.   Abdomen:  Bowel sounds positive,abdomen soft and non-tender without masses, organomegaly or hernias noted. Rectal:  HEME + BROWN STOOL  no external abnormalities.   Genitalia:  Pelvic Exam:        External: normal female genitalia without lesions or masses        Vagina: normal without lesions or masses        Cervix: normal without lesions or masses        Adnexa: normal bimanual exam without masses or fullness        Uterus: normal by palpation        Pap smear: performed Msk:  normal ROM, no joint tenderness, no joint swelling, no joint warmth, no redness over joints, no joint deformities, no joint instability, and no crepitation.   Pulses:  R posterior tibial normal, R dorsalis pedis normal, R carotid normal, L posterior tibial normal, L dorsalis pedis normal, and L carotid normal.   Extremities:  No clubbing, cyanosis, edema, or deformity noted with normal full range of motion of all joints.   Neurologic:  No cranial nerve deficits noted. Station and gait are normal. Plantar reflexes are down-going bilaterally. DTRs are symmetrical throughout. Sensory, motor and coordinative functions appear intact. Skin:  Intact without suspicious lesions or rashes Cervical Nodes:  No lymphadenopathy noted Axillary Nodes:  No  palpable lymphadenopathy Psych:  Cognition and judgment appear intact. Alert and cooperative with normal attention span and concentration. No apparent delusions, illusions, hallucinations   Impression & Recommendations:  Problem # 1:  PREVENTIVE HEALTH CARE (ICD-V70.0) check  fasting labs check mammogram ghm utd   Orders: Gastroenterology Referral (GI) EKG w/ Interpretation ([address])  Problem # 2:  GUAIAC POSITIVE STOOL (ICD-578.1)  Orders: Gastroenterology Referral (GI) EKG w/ Interpretation ([address])  Complete Medication List: 1)  Lyrica 100 Mg Caps (Pregabalin) .Marland Kitchen.. 1 by mouth two times a day 2)  Multivitamin/iron Tabs (Multiple vitamins-iron) .... Qd 3)  Cvs Ibuprofen 200 Mg Tabs (Ibuprofen) .... As needed 4)  Baby Aspirin 81 Mg Chew (Aspirin) .... One daily.  Patient Instructions: 1)  V70.0  cbcd, bmp, hep,lipid, tsh , ua   EKG  Procedure date:  11/29/2009  Findings:      Sinus bradycardia with rate of:  56 bpm    Last Mammogram:  Normal Bilateral (09/02/2006 9:16:44 AM) Mammogram Result Date:  12/12/2008 Mammogram Result:  normal Mammogram Next Due:  1 yr

## 2010-09-12 NOTE — Letter (Signed)
Summary: Unable To Reach-Consult Scheduled  [facility] [place]  [address] [place], [place] [address]   Phone: [phone]  Fax: [phone]    01/16/2010 MRN: [id number]    Dear Jackie Casey,   We have been unable to reach you by phone.  Please contact our office with an updated phone number.     Thank you,  Army Fossa CMA  January 16, 2010 10:13 AM

## 2010-09-26 NOTE — Letter (Signed)
Summary: [facility]  [facility]   Imported By: Maryln Gottron 09/20/2010 09:18:30  _____________________________________________________________________  External Attachment:    Type:   Image     Comment:   External Document

## 2010-10-26 ENCOUNTER — Encounter: Payer: Self-pay | Admitting: Family Medicine

## 2010-12-02 ENCOUNTER — Ambulatory Visit (INDEPENDENT_AMBULATORY_CARE_PROVIDER_SITE_OTHER): Payer: 59 | Admitting: Family Medicine

## 2010-12-02 ENCOUNTER — Encounter: Payer: Self-pay | Admitting: Family Medicine

## 2010-12-02 ENCOUNTER — Other Ambulatory Visit (HOSPITAL_COMMUNITY)
Admission: RE | Admit: 2010-12-02 | Discharge: 2010-12-02 | Disposition: A | Discharge status: Home / Self Care | Payer: 59 | Source: Ambulatory Visit | Attending: Family Medicine | Admitting: Family Medicine

## 2010-12-02 VITALS — BP 124/72 | HR 56 | Ht 65.0 in | Wt 141.8 lb

## 2010-12-02 DIAGNOSIS — Z0000 Encounter for general adult medical examination without abnormal findings: Secondary | ICD-10-CM

## 2010-12-02 DIAGNOSIS — Z113 Encounter for screening for infections with a predominantly sexual mode of transmission: Secondary | ICD-10-CM | POA: Insufficient documentation

## 2010-12-02 DIAGNOSIS — Z01419 Encounter for gynecological examination (general) (routine) without abnormal findings: Secondary | ICD-10-CM | POA: Insufficient documentation

## 2010-12-02 MED ORDER — TETANUS-DIPHTH-ACELL PERTUSSIS 5-2.5-18.5 LF-MCG/0.5 IM SUSP
0.5000 mL | Freq: Once | INTRAMUSCULAR | Status: AC
  Administered 2010-12-02: 0.5 mL via INTRAMUSCULAR

## 2010-12-02 NOTE — Patient Instructions (Signed)
Anemia & Vitamin Deficiency Anemia is a level of hemoglobin or hematocrit which is lower than normal. Hemoglobin is the substance in red blood cells that carries oxygen to all parts of your body. Hematocrit is a reading which is the percentage of your blood which is made up of red blood cells. Anemia can be temporary or it can be a caused by long term disease or illness. People with mild anemia may not have symptoms or may have only mild symptoms. People with severe anemia can:  Feel tired.   Get short of breath with activity.   Have problems with daily activities.  Vitamin deficiencies are most common in older adults. Vitamin deficiencies are particularly common in those who have lost interest in eating and live on "tea and toast" or other restricted diets. One study showed that 13% of adults ages 66 to 100 have vitamin B12 deficiency. Another study found that 5% of healthy older adults have low folate. Vegetarians may also be prone to vitamin B12 deficiency due to the lack of meat in their diet.  Folate, vitamin B6, vitamin B12, and vitamin C are the vitamins needed for a body to produce healthy red blood cells. Not having enough of one or more of these vitamins may cause vitamin deficiency anemia.  CAUSES Low vitamin B6 is usually due to not eating enough foods that contain B6. Good sources of vitamin B6 are:  Meat.  Liver.   Cereal grains.   Bananas.  Nuts.   Certain medications can also cause vitamin B6 deficiency. Folate deficiency is often caused by an unbalanced diet that does not include enough:  Fresh fruits.   Green, leafy vegetables.  Other common causes of vitamin deficiency anemia are pregnancy, breastfeeding, alcohol abuse, and growth spurts.  Vitamin B12 deficiency develops when your body is not able to absorb this vitamin. This can be caused by medication, stomach or bowel surgery, and certain diseases. Sometimes vitamin B12 deficiency occurs in strict vegetarians and  people who do not eat much meat, milk, or eggs. In older people, the most common cause of vitamin B12 deficiency is not having enough gastric juice to get the B12 out of the food you eat. This is known as achlorhydria. Vitamin deficiency also develops when a person's diet does not include enough citrus fruits and other sources of essential vitamins.  SYMPTOMS Most people do not realize they are anemic until a blood test shows a low hemoglobin or hematocrit. Symptoms and signs usually develop when anemia is moderate to severe, and can include:  Fatigue.  Chest pain.   Trouble breathing.   Numbness or coldness in your hands and feet.   Weakness.  Dizziness.   A fast heartbeat.   Pale skin.  Irritability.   Headaches.   Long-lasting deficiency of vitamin B6, folate, or vitamin B12 can result in anemia. With folate and vitamin B12 deficiency, anemia often causes symptoms such as:  Fatigue.   Poor appetite.   Weight loss.   Diarrhea.  The earliest symptoms of vitamin B12 deficiency may be:  Weakness.   Poor coordination.   Numbness.   A "pins and needles" feeling in the arms and legs.   Mild irritability and forgetfulness are other early signs.  A severe untreated deficiency can result in serious damage to the nerves, spinal cord, and brain. Vitamin C helps your body absorb iron, and low levels can result in anemia.  DIAGNOSIS Your doctor will provide the treatment that is best for you  based on what is causing the anemia.  TREATMENT Several medications are approved to help correct anemia. In certain cases mild anemia due to vitamin deficiencies may be corrected with a change in diet. Folate deficiency in pregnant women can cause serious birth defects. Many processed foods are fortified with folic acid, the manufactured form of naturally occurring folate. Close communication with your doctor will ensure you will receive the best anemia treatment available.  Normal Lab Values:    Normal hemoglobin is greater than 12 g/dL for women, with a hematocrit which is greater than 36%. Normal hemoglobin is greater than 14 g/dL for men, with a normal hematocrit which is greater than 42%.  GLOSSARY (DEFINITION OF TERMS)  Hemoglobin: Substance carried by red blood cells; delivers oxygen throughout your body   Hematocrit: Percentage of red blood cells in a blood sample   Folate: Type of vitamin B.   Achlorhydria: Low level of gastric juices needed to extract vitamin B from food.  Document Released: 04/08/2004 Document Re-Released: 01/15/2010 [facility] Patient Information 2011 [place], [place].

## 2010-12-02 NOTE — Progress Notes (Signed)
Subjective:     Jackie Casey is a 50 y.o. female and is here for a comprehensive physical exam. The patient reports no problems.  History   Social History  . Marital Status: Single    Spouse Name: N/A    Number of Children: N/A  . Years of Education: N/A   Occupational History  . ADMINISTRATIVE Soil scientist   Social History Main Topics  . Smoking status: Never Smoker   . Smokeless tobacco: Never Used  . Alcohol Use: Yes  . Drug Use: No  . Sexually Active: Yes -- Female partner(s)    Birth Control/ Protection: None   Other Topics Concern  . Not on file   Social History Narrative  . No narrative on file   Health Maintenance  Topic Date Due  . Mammogram  08/24/2009  . Tetanus/tdap  09/08/2009  . Influenza Vaccine  05/12/2011  . Pap Smear  11/29/2012  . Colonoscopy  12/25/2019    The following portions of the patient's history were reviewed and updated as appropriate: allergies, current medications, past family history, past medical history, past social history, past surgical history and problem list.  Review of Systems  Review of Systems  Constitutional: Negative for activity change, appetite change and fatigue.  HENT: Negative for hearing loss, congestion, tinnitus and ear discharge.  dentist--within last 6 months Eyes: Negative for visual disturbance (see optho q1y -- vision corrected to 20/20 with glasses).  Respiratory: Negative for cough, chest tightness and shortness of breath.   Cardiovascular: Negative for chest pain, palpitations and leg swelling.  Gastrointestinal: Negative for abdominal pain, diarrhea, constipation and abdominal distention.  Genitourinary: Negative for urgency, frequency, decreased urine volume and difficulty urinating.  Musculoskeletal: Negative for back pain, arthralgias and gait problem.  Skin: Negative for color change, pallor and rash.  Neurological: Negative for dizziness, light-headedness, numbness and  headaches.  Hematological: Negative for adenopathy. Does not bruise/bleed easily.  Psychiatric/Behavioral: Negative for suicidal ideas, confusion, sleep disturbance, self-injury, dysphoric mood, decreased concentration and agitation.  Pt is able to read and write and can do all ADLs No risk for falling No abuse/ violence in home     Objective:    BP 124/72  Pulse 56  Ht 5\' 5"  (1.651 m)  Wt 141 lb 12.8 oz (64.32 kg)  BMI 23.60 kg/m2  LMP 11/20/2010 General appearance: alert, cooperative, appears stated age and no distress Head: Normocephalic, without obvious abnormality, atraumatic Eyes: conjunctivae/corneas clear. PERRL, EOM's intact. Fundi benign. Ears: normal TM's and external ear canals both ears Nose: Nares normal. Septum midline. Mucosa normal. No drainage or sinus tenderness. Throat: lips, mucosa, and tongue normal; teeth and gums normal Neck: no adenopathy, no carotid bruit, no JVD, supple, symmetrical, trachea midline and thyroid not enlarged, symmetric, no tenderness/mass/nodules Lungs: clear to auscultation bilaterally Breasts: normal appearance, no masses or tenderness Heart: regular rate and rhythm, S1, S2 normal, no murmur, click, rub or gallop Abdomen: soft, non-tender; bowel sounds normal; no masses,  no organomegaly Pelvic: cervix normal in appearance, external genitalia normal, no adnexal masses or tenderness, no cervical motion tenderness, rectovaginal septum normal, uterus normal size, shape, and consistency and vagina normal without discharge Rectal--heme neg brown stool Extremities: extremities normal, atraumatic, no cyanosis or edema Pulses: 2+ and symmetric Skin: Skin color, texture, turgor normal. No rashes or lesions Lymph nodes: Cervical, supraclavicular, and axillary nodes normal. Neurologic: Grossly normal    Assessment:    Healthy female exam.  Plan:    check fasting labs ghm utd  See After Visit Summary for Counseling Recommendations

## 2010-12-04 ENCOUNTER — Other Ambulatory Visit (INDEPENDENT_AMBULATORY_CARE_PROVIDER_SITE_OTHER): Payer: 59

## 2010-12-04 DIAGNOSIS — Z0000 Encounter for general adult medical examination without abnormal findings: Secondary | ICD-10-CM

## 2010-12-04 LAB — CBC WITH DIFFERENTIAL/PLATELET
Basophils Relative: 1.3 % (ref 0.0–3.0)
Eosinophils Absolute: 0.1 10*3/uL (ref 0.0–0.7)
Eosinophils Relative: 3 % (ref 0.0–5.0)
HCT: 34.4 % — ABNORMAL LOW (ref 36.0–46.0)
Lymphs Abs: 0.8 10*3/uL (ref 0.7–4.0)
MCHC: 33.8 g/dL (ref 30.0–36.0)
MCV: 87.9 fl (ref 78.0–100.0)
Monocytes Absolute: 0.3 10*3/uL (ref 0.1–1.0)
Neutrophils Relative %: 60.9 % (ref 43.0–77.0)
Platelets: 85 10*3/uL — ABNORMAL LOW (ref 150.0–400.0)
RBC: 3.91 Mil/uL (ref 3.87–5.11)

## 2010-12-04 LAB — POCT URINALYSIS DIPSTICK
Ketones, UA: NEGATIVE
Protein, UA: NEGATIVE
Spec Grav, UA: 1.01
Urobilinogen, UA: NEGATIVE
pH, UA: 7

## 2010-12-04 LAB — BASIC METABOLIC PANEL
BUN: 17 mg/dL (ref 6–23)
Calcium: 9 mg/dL (ref 8.4–10.5)
Creatinine, Ser: 0.9 mg/dL (ref 0.4–1.2)
GFR: 85.9 mL/min (ref >60.00)
Glucose, Bld: 71 mg/dL (ref 70–99)

## 2010-12-04 LAB — TSH: TSH: 0.63 u[IU]/mL (ref 0.35–5.50)

## 2010-12-04 LAB — LIPID PANEL
Cholesterol: 148 mg/dL (ref 0–200)
HDL: 57.7 mg/dL (ref >39.00)
VLDL: 9.6 mg/dL (ref 0.0–40.0)

## 2010-12-04 LAB — HEPATIC FUNCTION PANEL
Albumin: 3.7 g/dL (ref 3.5–5.2)
Total Protein: 6.8 g/dL (ref 6.0–8.3)

## 2010-12-06 ENCOUNTER — Telehealth: Payer: Self-pay

## 2010-12-06 NOTE — Telephone Encounter (Signed)
Left message to call back     KP 

## 2010-12-06 NOTE — Telephone Encounter (Signed)
Message copied by Almeta Monas on Fri Dec 06, 2010  2:06 PM ------      Message from: Loreen Freud      Created: Thu Dec 05, 2010  4:43 PM       Neg GC and chlamydia

## 2010-12-06 NOTE — Telephone Encounter (Signed)
Spoke with and made her aware of results     KP

## 2010-12-09 ENCOUNTER — Encounter: Payer: Self-pay | Admitting: *Deleted

## 2011-01-02 ENCOUNTER — Encounter: Payer: Self-pay | Admitting: Family Medicine

## 2011-01-02 ENCOUNTER — Ambulatory Visit (INDEPENDENT_AMBULATORY_CARE_PROVIDER_SITE_OTHER): Payer: 59 | Admitting: Family Medicine

## 2011-01-02 VITALS — BP 120/80 | Temp 99.1°F | Wt 141.0 lb

## 2011-01-02 DIAGNOSIS — L0291 Cutaneous abscess, unspecified: Secondary | ICD-10-CM

## 2011-01-02 DIAGNOSIS — L0390 Cellulitis, unspecified: Secondary | ICD-10-CM

## 2011-01-02 MED ORDER — CEPHALEXIN 500 MG PO CAPS: 500.0000 mg | ORAL_CAPSULE | Freq: Four times a day (QID) | ORAL | Status: AC

## 2011-01-02 MED ORDER — HYDROCODONE-ACETAMINOPHEN 7.5-750 MG PO TABS: 1.0000 | ORAL_TABLET | Freq: Four times a day (QID) | ORAL | Status: DC | PRN

## 2011-01-02 NOTE — Patient Instructions (Signed)
Cellulitis Cellulitis is an infection of the skin and the tissue beneath it. The area is typically red and tender. It is caused by germs (bacteria) (usually staph or strep) that enter the body through cuts or sores. Cellulitis most commonly occurs in the arms or lower legs.  HOME CARE INSTRUCTIONS  If you are given a prescription for medications which kill germs (antibiotics), take as directed until finished.   If the infection is on the arm or leg, keep the limb elevated as able.   Use a warm cloth several times per day to relieve pain and encourage healing.   See your caregiver for recheck of the infected site in 1 week or sooner if problems arise.   Only take over-the-counter or prescription medicines for pain, discomfort, or fever as directed by your caregiver.  SEEK MEDICAL CARE IF:  An oral temperature above 100.4 develops, not controlled by medication.   The area of redness (inflammation) is spreading, there are red streaks coming from the infected site, or if a part of the infection begins to turn dark in color.   The joint or bone underneath the infected skin becomes painful after the skin has healed.   The infection returns in the same or another area after it seems to have gone away.   A boil or bump swells up. This may be an abscess.   New, unexplained problems such as pain or fever develop.  SEEK IMMEDIATE MEDICAL CARE IF:  You or your child feels drowsy or lethargic.   There is vomiting, diarrhea, or lasting discomfort or feeling ill (malaise) with muscle aches and pains.  MAKE SURE YOU:   Understand these instructions.   Will watch your condition.   Will get help right away if you are not doing well or get worse.  Document Released: 05/07/2005 Document Re-Released: 05/25/2009 [facility] Patient Information 2011 [place], [place].

## 2011-01-02 NOTE — Progress Notes (Signed)
  Subjective:     Jackie Casey is a 50 y.o. female who presents for evaluation of a rash involving the thigh. Rash started several days ago. Lesions are red, and blistering in texture. Rash has changed over time. Rash is painful and is pruritic. Associated symptoms: none. Patient denies: abdominal pain, arthralgia, decrease in appetite and fever. Patient has not had contacts with similar rash. Patient has not had new exposures (soaps, lotions, laundry detergents, foods, medications, plants, insects or animals).  The following portions of the patient's history were reviewed and updated as appropriate: allergies, current medications, past family history, past medical history, past social history, past surgical history and problem list.  Review of Systems Pertinent items are noted in HPI.    Objective:    BP 120/80  Temp(Src) 99.1 F (37.3 C) (Oral)  Wt 141 lb (63.957 kg) General:  alert, cooperative, appears stated age and no distress  Skin:  erythema noted on R thigh--medial and vesicles noted on in center of errythema     Assessment:    cellulitis and ? insect bite    Plan:    Medications: antibiotics: keflex. Follow up in 1 week. --- or sooner prn--if errythema spreads go to ER--pt understands

## 2011-01-09 ENCOUNTER — Other Ambulatory Visit: Payer: Self-pay | Admitting: Hematology & Oncology

## 2011-01-09 ENCOUNTER — Encounter: Payer: 59 | Admitting: Hematology & Oncology

## 2011-01-09 DIAGNOSIS — D72819 Decreased white blood cell count, unspecified: Secondary | ICD-10-CM

## 2011-01-09 DIAGNOSIS — D696 Thrombocytopenia, unspecified: Secondary | ICD-10-CM

## 2011-01-09 LAB — CHCC SATELLITE - SMEAR

## 2011-01-09 LAB — CBC WITH DIFFERENTIAL (CANCER CENTER ONLY)
BASO%: 0.6 % (ref 0.0–2.0)
EOS%: 5 % (ref 0.0–7.0)
LYMPH#: 0.7 10*3/uL — ABNORMAL LOW (ref 0.9–3.3)
LYMPH%: 21.4 % (ref 14.0–48.0)
MCV: 84 fL (ref 81–101)
MONO#: 0.4 10*3/uL (ref 0.1–0.9)
Platelets: 151 10*3/uL (ref 145–400)
RDW: 12.6 % (ref 11.1–15.7)
WBC: 3.2 10*3/uL — ABNORMAL LOW (ref 3.9–10.0)

## 2011-01-09 LAB — TECHNOLOGIST REVIEW CHCC SATELLITE

## 2011-01-13 ENCOUNTER — Ambulatory Visit: Payer: 59 | Admitting: Family Medicine

## 2011-01-15 ENCOUNTER — Encounter: Payer: Self-pay | Admitting: Family Medicine

## 2011-01-24 ENCOUNTER — Encounter: Payer: Self-pay | Admitting: Family Medicine

## 2011-04-11 ENCOUNTER — Encounter: Payer: Self-pay | Admitting: Family

## 2011-04-11 ENCOUNTER — Ambulatory Visit (INDEPENDENT_AMBULATORY_CARE_PROVIDER_SITE_OTHER): Payer: 59 | Admitting: Family

## 2011-04-11 DIAGNOSIS — M25569 Pain in unspecified knee: Secondary | ICD-10-CM

## 2011-04-11 DIAGNOSIS — M25561 Pain in right knee: Secondary | ICD-10-CM

## 2011-04-11 MED ORDER — MELOXICAM 7.5 MG PO TABS: 7.5000 mg | ORAL_TABLET | Freq: Every day | ORAL | Status: DC

## 2011-04-11 NOTE — Patient Instructions (Signed)
Apply ice twice daily to your knee for 15 minutes.  Call if your pain/swelling worsens, or if it is not improved in 2 weeks.

## 2011-04-11 NOTE — Assessment & Plan Note (Signed)
Will plan to treat conservatively with mobic and ice.   If no improvement in 2-3 weeks, will plan referral to Dr. Pearletha [facility] sports medicine.

## 2011-04-11 NOTE — Progress Notes (Signed)
  Subjective:    Patient ID: Jackie Casey, female    DOB: 1961/01/21, 50 y.o.   MRN: [id number]  HPI  Jackie Casey is a 50 yr old female who presents with complaint of R knee pain.  Was kneeing 2 days ago and then upon standing she heard a pop.  Originally did not hurt, but notes that if she sits for a long time the knee becomes stiff. Reports that she applied ice yesterday which helped her symptoms.      Review of Systems See HPI  Past Medical History  Diagnosis Date  . Trigeminal neuralgia     History   Social History  . Marital Status: Single    Spouse Name: N/A    Number of Children: N/A  . Years of Education: N/A   Occupational History  . ADMINISTRATIVE Soil scientist   Social History Main Topics  . Smoking status: Never Smoker   . Smokeless tobacco: Never Used  . Alcohol Use: Yes  . Drug Use: No  . Sexually Active: Yes -- Female partner(s)    Birth Control/ Protection: None   Other Topics Concern  . Not on file   Social History Narrative  . No narrative on file    Past Surgical History  Procedure Date  . Tonsillectomy     Family History  Problem Relation Age of Onset  . Stroke Mother     stroke  . Asthma Mother   . Hypertension Mother     No Known Allergies  Current Outpatient Prescriptions on File Prior to Visit  Medication Sig Dispense Refill  . aspirin 81 MG EC tablet Take 81 mg by mouth. Two times weekly      . Calcium Carbonate-Vitamin D (CALCIUM + D PO) Take 1 tablet by mouth daily.        . fish oil-omega-3 fatty acids 1000 MG capsule Take 1 g by mouth daily.        . Multiple Vitamin (MULTIVITAMIN) tablet Take 1 tablet by mouth daily.        . pregabalin (LYRICA) 100 MG capsule Take 100 mg by mouth daily.         BP 100/70  Pulse 60  Temp(Src) 97.8 F (36.6 C) (Oral)  Resp 16  Ht 5\' 5"  (1.651 m)  Wt 145 lb (65.772 kg)  BMI 24.13 kg/m2       Objective:   Physical Exam  Constitutional: She appears  well-developed and well-nourished.  Cardiovascular: Normal rate and regular rhythm.   No murmur heard. Pulmonary/Chest: Effort normal and breath sounds normal. No respiratory distress. She has no wheezes. She has no rales.  Musculoskeletal:       Mild swelling of the right knee with tenderness to palpation medially. Full ROM, mild stiffness is noted, neg drawer test.   Psychiatric: She has a normal mood and affect. Her speech is normal and behavior is normal.          Assessment & Plan:

## 2011-05-08 ENCOUNTER — Ambulatory Visit (INDEPENDENT_AMBULATORY_CARE_PROVIDER_SITE_OTHER): Payer: 59 | Admitting: Family Medicine

## 2011-05-08 ENCOUNTER — Encounter: Payer: Self-pay | Admitting: Family Medicine

## 2011-05-08 VITALS — BP 124/70 | HR 54 | Temp 98.2°F | Wt 146.2 lb

## 2011-05-08 DIAGNOSIS — M25569 Pain in unspecified knee: Secondary | ICD-10-CM

## 2011-05-08 NOTE — Patient Instructions (Signed)
Knee Pain, General The knee is the complex joint between your thigh and your lower leg. It is made up of bones, tendons, ligaments, and cartilage. The bones that make up the knee are:  The femur in the thigh.   The tibia and fibula in the lower leg.   The patella or kneecap riding in the groove on the lower femur.  CAUSES Knee pain is a common complaint with many causes.  A few of these causes are:  Injury, such as:   A ruptured ligament or tendon injury.   Torn cartilage.    Medical conditions, such as:   Gout   Arthritis   Infections   Overuse, over training or overdoing a physical activity.  Knee pain can be minor or severe. Knee pain can accompany debilitating injury. Minor knee problems often respond well to self-care measures or get well on their own. More serious injuries may need medical intervention or even surgery. Symptoms The knee is complex. Symptoms of knee problems can vary widely. Some of the problems are:  Pain with movement and weight bearing.  Swelling and tenderness.   Buckling of the knee.   Inability to straighten or extend your knee.   Your knee locks and you cannot straighten it.  Warmth and redness with pain and fever.   Deformity or dislocation of the kneecap.   DIAGNOSIS Determining what is wrong may be very straight forward such as when there is an injury. It can also be challenging because of the complexity of the knee. Tests to make a diagnosis may include:  Your caregiver taking a history and doing a physical exam.   Routine X-rays can be used to rule out other problems. X-rays will not reveal a cartilage tear. Some injuries of the knee can be diagnosed by:   Arthroscopy a surgical technique by which a small video camera is inserted through tiny incisions on the sides of the knee. This procedure is used to examine and repair internal knee joint problems. Tiny instruments can be used during arthroscopy to repair the torn knee cartilage  (meniscus).   Arthrography is a radiology technique. A contrast liquid is directly injected into the knee joint. Internal structures of the knee joint then become visible on X-ray film.   An MRI scan is a non x-ray radiology procedure in which magnetic fields and a computer produce two- or three-dimensional images of the inside of the knee. Cartilage tears are often visible using an MRI scanner. MRI scans have largely replaced arthrography in diagnosing cartilage tears of the knee.   Blood work.   Examination of the fluid that helps to lubricate the knee joint (synovial fluid). This is done by taking a sample out using a needle and a syringe.  treatment The treatment of knee problems depends on the cause. Some of these treatments are:  Depending on the injury, proper casting, splinting, surgery or physical therapy care will be needed.   Give yourself adequate recovery time. Do not overuse your joints. If you begin to get sore during workout routines, back off. Slow down or do fewer repetitions.   For repetitive activities such as cycling or running, maintain your strength and nutrition.   Alternate muscle groups. For example if you are a weight lifter, work the upper body on one day and the lower body the next.   Either tight or weak muscles do not give the proper support for your knee. Tight or weak muscles do not absorb the stress placed   on the knee joint. Keep the muscles surrounding the knee strong.   Take care of mechanical problems.   If you have flat feet, orthotics or special shoes may help. See your caregiver if you need help.   Arch supports, sometimes with wedges on the inner or outer aspect of the heel, can help. These can shift pressure away from the side of the knee most bothered by osteoarthritis.   A brace called an "unloader" brace also may be used to help ease the pressure on the most arthritic side of the knee.   If your caregiver has prescribed crutches, braces,  wraps or ice, use as directed. The acronym for this is PRICE. This means protection, rest, ice, compression and elevation.   Nonsteroidal anti-inflammatory drugs (NSAID's), can help relieve pain. But if taken immediately after an injury, they may actually increase swelling. Take NSAID's with food in your stomach. Stop them if you develop stomach problems. Do not take these if you have a history of ulcers, stomach pain or bleeding from the bowel. Do not take without your caregiver's approval if you have problems with fluid retention, heart failure, or kidney problems.   For ongoing knee problems, physical therapy may be helpful.   Glucosamine and chondroitin are over-the-counter dietary supplements. Both may help relieve the pain of osteoarthritis in the knee. These medicines are different from the usual anti-inflammatory drugs. Glucosamine may decrease the rate of cartilage destruction.   Injections of a corticosteroid drug into your knee joint may help reduce the symptoms of an arthritis flare-up. They may provide pain relief that lasts a few months. You may have to wait a few months between injections. The injections do have a small increased risk of infection, water retention and elevated blood sugar levels.   Hyaluronic acid injected into damaged joints may ease pain and provide lubrication. These injections may work by reducing inflammation. A series of shots may give relief for as long as 6 months.   Topical painkillers. Applying certain ointments to your skin may help relieve the pain and stiffness of osteoarthritis. Ask your pharmacist for suggestions. Many over the-counter products are approved for temporary relief of arthritis pain.   In some countries, doctors often prescribe topical NSAID's for relief of chronic conditions such as arthritis and tendinitis. A review of treatment with NSAID creams found that they worked as well as oral medications but without the serious side effects.    Prevention  Maintain a healthy weight. Extra pounds put more strain on your joints.   Get strong, stay limber. Weak muscles are a common cause of knee injuries. Stretching is important. Include flexibility exercises in your workouts.   Be smart about exercise. If you have osteoarthritis, chronic knee pain or recurring injuries, you may need to change the way you exercise. This does not mean you have to stop being active. If your knees ache after jogging or playing basketball, consider switching to swimming, water aerobics or other low-impact activities, at least for a few days a week. Sometimes limiting high-impact activities will provide relief.   Make sure your shoes fit well. Choose footwear that is right for your sport.   Protect your knees. Use the proper gear for knee-sensitive activities. Use kneepads when playing volleyball or laying carpet. Buckle your seat belt every time you drive. Most shattered kneecaps occur in car accidents.   Rest when you are tired.  SEEK MEDICAL CARE IF: You have knee pain that is continual and does not seem   to be getting better.  Seek IMMEDIATE MEDICAL CARE IF:  Your knee joint feels hot to the touch and you have a high fever. MAKE SURE YOU:   Understand these instructions.   Will watch your condition.   Will get help right away if you are not doing well or get worse.  Document Released: 05/25/2007 Document Re-Released: 10/22/2009 [employer] Patient Information 2011 [employer]. 

## 2011-05-08 NOTE — Progress Notes (Signed)
  Subjective:    Jackie Casey is a 50 y.o. female who presents with a knee injury involving the right knee. Onset was gradual, starting about 4 weeks ago. Inciting event: twisting injury while working on bed. Current symptoms include: popping sensation. Pain is aggravated by any weight bearing. Patient has had no prior knee problems. Evaluation to date: none. Treatment to date: avoidance of offending activity, ice and prescription NSAIDS which are not very effective.  Pain has improved slightly but not completely.  See last visit  The following portions of the patient's history were reviewed and updated as appropriate: allergies, current medications, past medical history, past social history, past surgical history and problem list.   Review of Systems Pertinent items are noted in HPI.   Objective:    BP 124/70  Pulse 54  Temp(Src) 98.2 F (36.8 C) (Oral)  Wt 146 lb 3.2 oz (66.316 kg)  SpO2 99% Right knee: normal and no effusion, full active range of motion, no joint line tenderness, ligamentous structures intact.--- pain only with weight bearing  Left knee:  normal and no effusion, full active range of motion, no joint line tenderness, ligamentous structures intact.   X-ray right knee: not indicated    Assessment:    Right knee injury    Plan:    Natural history and expected course discussed. Questions answered. Transport planner distributed. Rest, ice, compression, and elevation (RICE) therapy. NSAIDs per medication orders. pt wants to try knee sleeve first and then go to sport med if no better

## 2011-05-23 LAB — BASIC METABOLIC PANEL
BUN: 8
Calcium: 8.9
GFR calc non Af Amer: >60
Glucose, Bld: 100 — ABNORMAL HIGH
Sodium: 136

## 2011-05-23 LAB — DIFFERENTIAL
Basophils Absolute: 0
Eosinophils Absolute: 0.2
Lymphocytes Relative: 30
Monocytes Absolute: 0.2
Neutro Abs: 1.5 — ABNORMAL LOW

## 2011-05-23 LAB — POCT CARDIAC MARKERS: Operator id: 4533

## 2011-05-23 LAB — CBC
Hemoglobin: 11.2 — ABNORMAL LOW
MCHC: 33.6
RDW: 13.6

## 2011-07-22 ENCOUNTER — Ambulatory Visit (INDEPENDENT_AMBULATORY_CARE_PROVIDER_SITE_OTHER): Payer: 59 | Admitting: Sports Medicine

## 2011-07-22 VITALS — BP 120/60 | Ht 64.0 in | Wt 142.0 lb

## 2011-07-22 DIAGNOSIS — M25561 Pain in right knee: Secondary | ICD-10-CM

## 2011-07-22 DIAGNOSIS — M25569 Pain in unspecified knee: Secondary | ICD-10-CM

## 2011-07-22 NOTE — Patient Instructions (Signed)
You have had a small split in the cartilage  We need to use compression sleeve when standing or walking a lot  Do not do jump rope or bend knee too much  Do straight leg raises and a few limited bent leg raises  Try to ride stationary bike 3 x per week  Let's recheck in 4 weeks

## 2011-07-22 NOTE — Progress Notes (Signed)
  Subjective:    Patient ID: Jackie Casey, female    DOB: Jun 25, 1961, 50 y.o.   MRN: [id number]  HPI  About 2 mos ago rolled over in bed and twisted RT knee She felt a pop Pain along outside and along medial joint Saw PCP and given some meds but not much better Went back 2 wks after first visit and and tried a knee sleeve but nothing has helped much  No giving out Now pain is along medical joint line only Hurts after sitting Hurts after jump rope and certain exercise   Review of Systems     Objective:   Physical Exam NAD  Negative patellar apprehension Sit home is + McMurray's causes mild med pain no click Ligaments stable Lat jt line is OK Lags full extension by 3 deg and has pain at 140 deg of flex  MSK [place] No effusion PT normal QT normal Lat meniscus is norm Medial meniscus shows some calcification but no clear tear No cysts         Assessment & Plan:

## 2011-07-22 NOTE — Assessment & Plan Note (Signed)
Compression sleeve  Exercises as described  Avoid too much knee bend  Suspect this is mensical contusion but not tear  Reck in 4 weeks

## 2011-08-20 ENCOUNTER — Ambulatory Visit: Payer: 59 | Admitting: Sports Medicine

## 2011-09-22 ENCOUNTER — Ambulatory Visit: Payer: 59 | Admitting: Sports Medicine

## 2011-09-24 ENCOUNTER — Ambulatory Visit (INDEPENDENT_AMBULATORY_CARE_PROVIDER_SITE_OTHER): Payer: 59 | Admitting: Sports Medicine

## 2011-09-24 VITALS — BP 131/75 | Ht 65.0 in | Wt 140.0 lb

## 2011-09-24 DIAGNOSIS — M25569 Pain in unspecified knee: Secondary | ICD-10-CM

## 2011-09-24 DIAGNOSIS — M25561 Pain in right knee: Secondary | ICD-10-CM

## 2011-09-24 MED ORDER — MELOXICAM 7.5 MG PO TABS: 7.5000 mg | ORAL_TABLET | Freq: Two times a day (BID) | ORAL | Status: DC

## 2011-09-24 NOTE — Assessment & Plan Note (Addendum)
Discussed with patient importance of wearing knee sleeve consistently. Especially with exercise. Knee strengthening exercise discussed with patient as well as with [place] since they attend the same gym and are colleagues. Will also start on NSAIDs to help with pain. Broached issue with patient that this may likely be a long-term issue to deal with in the future. Patient expressed understanding.  todays exam is more consistent with PFS sxs and signs of meniscus injury are not noted today  Reck 6 weeks

## 2011-09-24 NOTE — Progress Notes (Signed)
  Subjective:    Patient ID: Jackie Casey, female    DOB: Mar 13, 1961, 51 y.o.   MRN: [id number]  HPI Patient is here to followup on right knee pain. Patient was seen 07/21/12 and was noted to have some mild right medial meniscal calcifications at last clinical visit. Patient was placed in a knee sleeve as well as some general knee strengthening exercises. Initial onset of injury was approximately in September or October of last year.  Today, patient states that knee pain has improved since last clinical visit visit. However, patient is upset because pain isn't completely gone away. Per patient she has been intermittently wearing her knee sleeve. Patient has not been using any medication for pain. No significant effusions for patient.  She has not done the rehab exercises we advised Working more on hamstrings Has done some limited recumbent bike   Review of Systems See HPI, otherwise ROS negative.     Objective:   Physical Exam Gen: in , NAD Knee: Normal to inspection with no erythema or effusion or obvious bony abnormalities. Palpation normal with no warmth or joint line tenderness or patellar tenderness or condyle tenderness. ROM normal in flexion and extension and lower leg rotation. Noted mild medial and infrapatellar pain with full knee extension. Ligaments with solid consistent endpoints including ACL, PCL, LCL, MCL. Negative Mcmurray's and provocative meniscal tests. Non painful patellar compression. Patellar and quadriceps tendons unremarkable. Hamstring and quadriceps strength is normal.   Assessment & Plan:

## 2011-09-24 NOTE — Patient Instructions (Signed)
Be sure to wear your knee sleeve on a more regular basis.  Wear knee sleeve with exercising.  Keep doing the recumbent bicycle Continue to do knee extensions.  Take the mobic as needed for knee pain.

## 2011-10-20 ENCOUNTER — Ambulatory Visit (INDEPENDENT_AMBULATORY_CARE_PROVIDER_SITE_OTHER): Payer: 59 | Admitting: Family Medicine

## 2011-10-20 ENCOUNTER — Encounter: Payer: 59 | Admitting: Family Medicine

## 2011-10-20 ENCOUNTER — Other Ambulatory Visit (HOSPITAL_COMMUNITY)
Admission: RE | Admit: 2011-10-20 | Discharge: 2011-10-20 | Disposition: A | Discharge status: Home / Self Care | Payer: 59 | Source: Ambulatory Visit | Attending: Family Medicine | Admitting: Family Medicine

## 2011-10-20 ENCOUNTER — Encounter: Payer: Self-pay | Admitting: Family Medicine

## 2011-10-20 VITALS — BP 110/70 | HR 70 | Temp 98.3°F | Ht 65.5 in | Wt 144.2 lb

## 2011-10-20 DIAGNOSIS — Z0000 Encounter for general adult medical examination without abnormal findings: Secondary | ICD-10-CM

## 2011-10-20 DIAGNOSIS — Z124 Encounter for screening for malignant neoplasm of cervix: Secondary | ICD-10-CM

## 2011-10-20 DIAGNOSIS — Z01419 Encounter for gynecological examination (general) (routine) without abnormal findings: Secondary | ICD-10-CM | POA: Insufficient documentation

## 2011-10-20 LAB — HEPATIC FUNCTION PANEL
Albumin: 4.1 g/dL (ref 3.5–5.2)
Total Protein: 7.5 g/dL (ref 6.0–8.3)

## 2011-10-20 LAB — CBC WITH DIFFERENTIAL/PLATELET
Basophils Absolute: 0 10*3/uL (ref 0.0–0.1)
Basophils Relative: 0.8 % (ref 0.0–3.0)
Eosinophils Absolute: 0 10*3/uL (ref 0.0–0.7)
HCT: 34.5 % — ABNORMAL LOW (ref 36.0–46.0)
Hemoglobin: 11.3 g/dL — ABNORMAL LOW (ref 12.0–15.0)
Lymphs Abs: 0.5 10*3/uL — ABNORMAL LOW (ref 0.7–4.0)
MCHC: 32.7 g/dL (ref 30.0–36.0)
MCV: 87.9 fl (ref 78.0–100.0)
Monocytes Absolute: 0.2 10*3/uL (ref 0.1–1.0)
Neutro Abs: 1.1 10*3/uL — ABNORMAL LOW (ref 1.4–7.7)
RDW: 13.4 % (ref 11.5–14.6)

## 2011-10-20 LAB — BASIC METABOLIC PANEL
CO2: 28 mEq/L (ref 19–32)
Calcium: 8.9 mg/dL (ref 8.4–10.5)
Chloride: 104 mEq/L (ref 96–112)
Glucose, Bld: 78 mg/dL (ref 70–99)
Potassium: 3.2 mEq/L — ABNORMAL LOW (ref 3.5–5.1)
Sodium: 137 mEq/L (ref 135–145)

## 2011-10-20 LAB — LIPID PANEL
Cholesterol: 160 mg/dL (ref 0–200)
HDL: 61.5 mg/dL (ref >39.00)
VLDL: 9.8 mg/dL (ref 0.0–40.0)

## 2011-10-20 LAB — POCT URINALYSIS DIPSTICK
Bilirubin, UA: NEGATIVE
Blood, UA: NEGATIVE
Ketones, UA: NEGATIVE
Leukocytes, UA: NEGATIVE
Protein, UA: NEGATIVE
Spec Grav, UA: 1.02
pH, UA: 6.5

## 2011-10-20 LAB — TSH: TSH: 0.37 u[IU]/mL (ref 0.35–5.50)

## 2011-10-20 NOTE — Progress Notes (Signed)
Subjective:     Jackie Casey is a 51 y.o. female and is here for a comprehensive physical exam. The patient reports no problems.  History   Social History  . Marital Status: Single    Spouse Name: N/A    Number of Children: N/A  . Years of Education: N/A   Occupational History  . ADMINISTRATIVE Soil scientist   Social History Main Topics  . Smoking status: Never Smoker   . Smokeless tobacco: Never Used  . Alcohol Use: 0.0 oz/week     rare--  1-2 x a month  . Drug Use: No  . Sexually Active: Yes -- Female partner(s)    Birth Control/ Protection: None   Other Topics Concern  . Not on file   Social History Narrative  . No narrative on file   Health Maintenance  Topic Date Due  . Influenza Vaccine  05/21/2012  . Mammogram  01/13/2013  . Pap Smear  12/01/2013  . Colonoscopy  12/25/2019  . Tetanus/tdap  12/01/2020    The following portions of the patient's history were reviewed and updated as appropriate: allergies, current medications, past family history, past medical history, past social history, past surgical history and problem list.  Review of Systems Review of Systems  Constitutional: Negative for activity change, appetite change and fatigue.  HENT: Negative for hearing loss, congestion, tinnitus and ear discharge.  dentist q39m Eyes: Negative for visual disturbance (see optho q1y -- vision corrected to 20/20 with glasses).  Respiratory: Negative for cough, chest tightness and shortness of breath.   Cardiovascular: Negative for chest pain, palpitations and leg swelling.  Gastrointestinal: Negative for abdominal pain, diarrhea, constipation and abdominal distention.  Genitourinary: Negative for urgency, frequency, decreased urine volume and difficulty urinating.  Musculoskeletal: Negative for back pain, arthralgias and gait problem.  Skin: Negative for color change, pallor and rash.  Neurological: Negative for dizziness, light-headedness,  numbness and headaches.  Hematological: Negative for adenopathy. Does not bruise/bleed easily.  Psychiatric/Behavioral: Negative for suicidal ideas, confusion, sleep disturbance, self-injury, dysphoric mood, decreased concentration and agitation.       Objective:    BP 110/70  Pulse 70  Temp(Src) 98.3 F (36.8 C) (Oral)  Ht 5' 5.5" (1.664 m)  Wt 144 lb 3.2 oz (65.409 kg)  BMI 23.63 kg/m2  SpO2 99%  LMP 10/15/2011 General appearance: alert, cooperative, appears stated age and no distress Head: Normocephalic, without obvious abnormality, atraumatic Eyes: conjunctivae/corneas clear. PERRL, EOM's intact. Fundi benign. Ears: normal TM's and external ear canals both ears Nose: Nares normal. Septum midline. Mucosa normal. No drainage or sinus tenderness. Throat: lips, mucosa, and tongue normal; teeth and gums normal Neck: no adenopathy, no carotid bruit, no JVD, supple, symmetrical, trachea midline and thyroid not enlarged, symmetric, no tenderness/mass/nodules Back: symmetric, no curvature. ROM normal. No CVA tenderness. Lungs: clear to auscultation bilaterally Breasts: normal appearance, no masses or tenderness Heart: regular rate and rhythm, S1, S2 normal, no murmur, click, rub or gallop Abdomen: soft, non-tender; bowel sounds normal; no masses,  no organomegaly Pelvic: cervix normal in appearance, external genitalia normal, no adnexal masses or tenderness, no cervical motion tenderness, rectovaginal septum normal, uterus normal size, shape, and consistency and vagina normal without discharge Extremities: extremities normal, atraumatic, no cyanosis or edema Pulses: 2+ and symmetric Skin: Skin color, texture, turgor normal. No rashes or lesions Lymph nodes: Cervical, supraclavicular, and axillary nodes normal. Neurologic: Alert and oriented X 3, normal strength and tone. Normal symmetric reflexes. Normal  coordination and gait psych--- no depression,anxiety    Assessment:     Healthy female exam.    Plan:    ghm utd Check fasting labs See After Visit Summary for Counseling Recommendations

## 2011-10-20 NOTE — Patient Instructions (Signed)
Preventive Care for Adults, Female A healthy lifestyle and preventive care can promote health and wellness. Preventive health guidelines for women include the following key practices.  A routine yearly physical is a good way to check with your caregiver about your health and preventive screening. It is a chance to share any concerns and updates on your health, and to receive a thorough exam.   Visit your dentist for a routine exam and preventive care every 6 months. Brush your teeth twice a day and floss once a day. Good oral hygiene prevents tooth decay and gum disease.   The frequency of eye exams is based on your age, health, family medical history, use of contact lenses, and other factors. Follow your caregiver's recommendations for frequency of eye exams.   Eat a healthy diet. Foods like vegetables, fruits, whole grains, low-fat dairy products, and lean protein foods contain the nutrients you need without too many calories. Decrease your intake of foods high in solid fats, added sugars, and salt. Eat the right amount of calories for you.Get information about a proper diet from your caregiver, if necessary.   Regular physical exercise is one of the most important things you can do for your health. Most adults should get at least 150 minutes of moderate-intensity exercise (any activity that increases your heart rate and causes you to sweat) each week. In addition, most adults need muscle-strengthening exercises on 2 or more days a week.   Maintain a healthy weight. The body mass index (BMI) is a screening tool to identify possible weight problems. It provides an estimate of body fat based on height and weight. Your caregiver can help determine your BMI, and can help you achieve or maintain a healthy weight.For adults 20 years and older:   A BMI below 18.5 is considered underweight.   A BMI of 18.5 to 24.9 is normal.   A BMI of 25 to 29.9 is considered overweight.   A BMI of 30 and above is  considered obese.   Maintain normal blood lipids and cholesterol levels by exercising and minimizing your intake of saturated fat. Eat a balanced diet with plenty of fruit and vegetables. Blood tests for lipids and cholesterol should begin at age 20 and be repeated every 5 years. If your lipid or cholesterol levels are high, you are over 50, or you are at high risk for heart disease, you may need your cholesterol levels checked more frequently.Ongoing high lipid and cholesterol levels should be treated with medicines if diet and exercise are not effective.   If you smoke, find out from your caregiver how to quit. If you do not use tobacco, do not start.   If you are pregnant, do not drink alcohol. If you are breastfeeding, be very cautious about drinking alcohol. If you are not pregnant and choose to drink alcohol, do not exceed 1 drink per day. One drink is considered to be 12 ounces (355 mL) of beer, 5 ounces (148 mL) of wine, or 1.5 ounces (44 mL) of liquor.   Avoid use of street drugs. Do not share needles with anyone. Ask for help if you need support or instructions about stopping the use of drugs.   High blood pressure causes heart disease and increases the risk of stroke. Your blood pressure should be checked at least every 1 to 2 years. Ongoing high blood pressure should be treated with medicines if weight loss and exercise are not effective.   If you are 55 to 52   years old, ask your caregiver if you should take aspirin to prevent strokes.   Diabetes screening involves taking a blood sample to check your fasting blood sugar level. This should be done once every 3 years, after age 45, if you are within normal weight and without risk factors for diabetes. Testing should be considered at a younger age or be carried out more frequently if you are overweight and have at least 1 risk factor for diabetes.   Breast cancer screening is essential preventive care for women. You should practice "breast  self-awareness." This means understanding the normal appearance and feel of your breasts and may include breast self-examination. Any changes detected, no matter how small, should be reported to a caregiver. Women in their 20s and 30s should have a clinical breast exam (CBE) by a caregiver as part of a regular health exam every 1 to 3 years. After age 40, women should have a CBE every year. Starting at age 40, women should consider having a mammography (breast X-ray test) every year. Women who have a family history of breast cancer should talk to their caregiver about genetic screening. Women at a high risk of breast cancer should talk to their caregivers about having magnetic resonance imaging (MRI) and a mammography every year.   The Pap test is a screening test for cervical cancer. A Pap test can show cell changes on the cervix that might become cervical cancer if left untreated. A Pap test is a procedure in which cells are obtained and examined from the lower end of the uterus (cervix).   Women should have a Pap test starting at age 21.   Between ages 21 and 29, Pap tests should be repeated every 2 years.   Beginning at age 30, you should have a Pap test every 3 years as long as the past 3 Pap tests have been normal.   Some women have medical problems that increase the chance of getting cervical cancer. Talk to your caregiver about these problems. It is especially important to talk to your caregiver if a new problem develops soon after your last Pap test. In these cases, your caregiver may recommend more frequent screening and Pap tests.   The above recommendations are the same for women who have or have not gotten the vaccine for human papillomavirus (HPV).   If you had a hysterectomy for a problem that was not cancer or a condition that could lead to cancer, then you no longer need Pap tests. Even if you no longer need a Pap test, a regular exam is a good idea to make sure no other problems are  starting.   If you are between ages 65 and 70, and you have had normal Pap tests going back 10 years, you no longer need Pap tests. Even if you no longer need a Pap test, a regular exam is a good idea to make sure no other problems are starting.   If you have had past treatment for cervical cancer or a condition that could lead to cancer, you need Pap tests and screening for cancer for at least 20 years after your treatment.   If Pap tests have been discontinued, risk factors (such as a new sexual partner) need to be reassessed to determine if screening should be resumed.   The HPV test is an additional test that may be used for cervical cancer screening. The HPV test looks for the virus that can cause the cell changes on the cervix.   The cells collected during the Pap test can be tested for HPV. The HPV test could be used to screen women aged 30 years and older, and should be used in women of any age who have unclear Pap test results. After the age of 30, women should have HPV testing at the same frequency as a Pap test.   Colorectal cancer can be detected and often prevented. Most routine colorectal cancer screening begins at the age of 50 and continues through age 75. However, your caregiver may recommend screening at an earlier age if you have risk factors for colon cancer. On a yearly basis, your caregiver may provide home test kits to check for hidden blood in the stool. Use of a small camera at the end of a tube, to directly examine the colon (sigmoidoscopy or colonoscopy), can detect the earliest forms of colorectal cancer. Talk to your caregiver about this at age 50, when routine screening begins. Direct examination of the colon should be repeated every 5 to 10 years through age 75, unless early forms of pre-cancerous polyps or small growths are found.   Hepatitis C blood testing is recommended for all people born from 1945 through 1965 and any individual with known risks for hepatitis C.    Practice safe sex. Use condoms and avoid high-risk sexual practices to reduce the spread of sexually transmitted infections (STIs). STIs include gonorrhea, chlamydia, syphilis, trichomonas, herpes, HPV, and human immunodeficiency virus (HIV). Herpes, HIV, and HPV are viral illnesses that have no cure. They can result in disability, cancer, and death. Sexually active women aged 25 and younger should be checked for chlamydia. Older women with new or multiple partners should also be tested for chlamydia. Testing for other STIs is recommended if you are sexually active and at increased risk.   Osteoporosis is a disease in which the bones lose minerals and strength with aging. This can result in serious bone fractures. The risk of osteoporosis can be identified using a bone density scan. Women ages 65 and over and women at risk for fractures or osteoporosis should discuss screening with their caregivers. Ask your caregiver whether you should take a calcium supplement or vitamin D to reduce the rate of osteoporosis.   Menopause can be associated with physical symptoms and risks. Hormone replacement therapy is available to decrease symptoms and risks. You should talk to your caregiver about whether hormone replacement therapy is right for you.   Use sunscreen with sun protection factor (SPF) of 30 or more. Apply sunscreen liberally and repeatedly throughout the day. You should seek shade when your shadow is shorter than you. Protect yourself by wearing long sleeves, pants, a wide-brimmed hat, and sunglasses year round, whenever you are outdoors.   Once a month, do a whole body skin exam, using a mirror to look at the skin on your back. Notify your caregiver of new moles, moles that have irregular borders, moles that are larger than a pencil eraser, or moles that have changed in shape or color.   Stay current with required immunizations.   Influenza. You need a dose every fall (or winter). The composition of  the flu vaccine changes each year, so being vaccinated once is not enough.   Pneumococcal polysaccharide. You need 1 to 2 doses if you smoke cigarettes or if you have certain chronic medical conditions. You need 1 dose at age 65 (or older) if you have never been vaccinated.   Tetanus, diphtheria, pertussis (Tdap, Td). Get 1 dose of   Tdap vaccine if you are younger than age 65, are over 65 and have contact with an infant, are a healthcare worker, are pregnant, or simply want to be protected from whooping cough. After that, you need a Td booster dose every 10 years. Consult your caregiver if you have not had at least 3 tetanus and diphtheria-containing shots sometime in your life or have a deep or dirty wound.   HPV. You need this vaccine if you are a woman age 26 or younger. The vaccine is given in 3 doses over 6 months.   Measles, mumps, rubella (MMR). You need at least 1 dose of MMR if you were born in 1957 or later. You may also need a second dose.   Meningococcal. If you are age 19 to 21 and a first-year college student living in a residence hall, or have one of several medical conditions, you need to get vaccinated against meningococcal disease. You may also need additional booster doses.   Zoster (shingles). If you are age 60 or older, you should get this vaccine.   Varicella (chickenpox). If you have never had chickenpox or you were vaccinated but received only 1 dose, talk to your caregiver to find out if you need this vaccine.   Hepatitis A. You need this vaccine if you have a specific risk factor for hepatitis A virus infection or you simply wish to be protected from this disease. The vaccine is usually given as 2 doses, 6 to 18 months apart.   Hepatitis B. You need this vaccine if you have a specific risk factor for hepatitis B virus infection or you simply wish to be protected from this disease. The vaccine is given in 3 doses, usually over 6 months.  Preventive Services /  Frequency Ages 19 to 39  Blood pressure check.** / Every 1 to 2 years.   Lipid and cholesterol check.** / Every 5 years beginning at age 20.   Clinical breast exam.** / Every 3 years for women in their 20s and 30s.   Pap test.** / Every 2 years from ages 21 through 29. Every 3 years starting at age 30 through age 65 or 70 with a history of 3 consecutive normal Pap tests.   HPV screening.** / Every 3 years from ages 30 through ages 65 to 70 with a history of 3 consecutive normal Pap tests.   Hepatitis C blood test.** / For any individual with known risks for hepatitis C.   Skin self-exam. / Monthly.   Influenza immunization.** / Every year.   Pneumococcal polysaccharide immunization.** / 1 to 2 doses if you smoke cigarettes or if you have certain chronic medical conditions.   Tetanus, diphtheria, pertussis (Tdap, Td) immunization. / A one-time dose of Tdap vaccine. After that, you need a Td booster dose every 10 years.   HPV immunization. / 3 doses over 6 months, if you are 26 and younger.   Measles, mumps, rubella (MMR) immunization. / You need at least 1 dose of MMR if you were born in 1957 or later. You may also need a second dose.   Meningococcal immunization. / 1 dose if you are age 19 to 21 and a first-year college student living in a residence hall, or have one of several medical conditions, you need to get vaccinated against meningococcal disease. You may also need additional booster doses.   Varicella immunization.** / Consult your caregiver.   Hepatitis A immunization.** / Consult your caregiver. 2 doses, 6 to 18 months   apart.   Hepatitis B immunization.** / Consult your caregiver. 3 doses usually over 6 months.  Ages 40 to 64  Blood pressure check.** / Every 1 to 2 years.   Lipid and cholesterol check.** / Every 5 years beginning at age 20.   Clinical breast exam.** / Every year after age 40.   Mammogram.** / Every year beginning at age 40 and continuing for as  long as you are in good health. Consult with your caregiver.   Pap test.** / Every 3 years starting at age 30 through age 65 or 70 with a history of 3 consecutive normal Pap tests.   HPV screening.** / Every 3 years from ages 30 through ages 65 to 70 with a history of 3 consecutive normal Pap tests.   Fecal occult blood test (FOBT) of stool. / Every year beginning at age 50 and continuing until age 75. You may not need to do this test if you get a colonoscopy every 10 years.   Flexible sigmoidoscopy or colonoscopy.** / Every 5 years for a flexible sigmoidoscopy or every 10 years for a colonoscopy beginning at age 50 and continuing until age 75.   Hepatitis C blood test.** / For all people born from 1945 through 1965 and any individual with known risks for hepatitis C.   Skin self-exam. / Monthly.   Influenza immunization.** / Every year.   Pneumococcal polysaccharide immunization.** / 1 to 2 doses if you smoke cigarettes or if you have certain chronic medical conditions.   Tetanus, diphtheria, pertussis (Tdap, Td) immunization.** / A one-time dose of Tdap vaccine. After that, you need a Td booster dose every 10 years.   Measles, mumps, rubella (MMR) immunization. / You need at least 1 dose of MMR if you were born in 1957 or later. You may also need a second dose.   Varicella immunization.** / Consult your caregiver.   Meningococcal immunization.** / Consult your caregiver.   Hepatitis A immunization.** / Consult your caregiver. 2 doses, 6 to 18 months apart.   Hepatitis B immunization.** / Consult your caregiver. 3 doses, usually over 6 months.  Ages 65 and over  Blood pressure check.** / Every 1 to 2 years.   Lipid and cholesterol check.** / Every 5 years beginning at age 20.   Clinical breast exam.** / Every year after age 40.   Mammogram.** / Every year beginning at age 40 and continuing for as long as you are in good health. Consult with your caregiver.   Pap test.** /  Every 3 years starting at age 30 through age 65 or 70 with a 3 consecutive normal Pap tests. Testing can be stopped between 65 and 70 with 3 consecutive normal Pap tests and no abnormal Pap or HPV tests in the past 10 years.   HPV screening.** / Every 3 years from ages 30 through ages 65 or 70 with a history of 3 consecutive normal Pap tests. Testing can be stopped between 65 and 70 with 3 consecutive normal Pap tests and no abnormal Pap or HPV tests in the past 10 years.   Fecal occult blood test (FOBT) of stool. / Every year beginning at age 50 and continuing until age 75. You may not need to do this test if you get a colonoscopy every 10 years.   Flexible sigmoidoscopy or colonoscopy.** / Every 5 years for a flexible sigmoidoscopy or every 10 years for a colonoscopy beginning at age 50 and continuing until age 75.   Hepatitis   C blood test.** / For all people born from 1945 through 1965 and any individual with known risks for hepatitis C.   Osteoporosis screening.** / A one-time screening for women ages 65 and over and women at risk for fractures or osteoporosis.   Skin self-exam. / Monthly.   Influenza immunization.** / Every year.   Pneumococcal polysaccharide immunization.** / 1 dose at age 65 (or older) if you have never been vaccinated.   Tetanus, diphtheria, pertussis (Tdap, Td) immunization. / A one-time dose of Tdap vaccine if you are over 65 and have contact with an infant, are a healthcare worker, or simply want to be protected from whooping cough. After that, you need a Td booster dose every 10 years.   Varicella immunization.** / Consult your caregiver.   Meningococcal immunization.** / Consult your caregiver.   Hepatitis A immunization.** / Consult your caregiver. 2 doses, 6 to 18 months apart.   Hepatitis B immunization.** / Check with your caregiver. 3 doses, usually over 6 months.  ** Family history and personal history of risk and conditions may change your caregiver's  recommendations. Document Released: 09/23/2001 Document Revised: 07/17/2011 Document Reviewed: 12/23/2010 [employer] Patient Information 2012 [employer]. 

## 2012-02-05 ENCOUNTER — Encounter: Payer: Self-pay | Admitting: Family Medicine

## 2012-09-10 ENCOUNTER — Telehealth: Payer: Self-pay

## 2012-09-10 ENCOUNTER — Ambulatory Visit (INDEPENDENT_AMBULATORY_CARE_PROVIDER_SITE_OTHER): Payer: 59 | Admitting: Family Medicine

## 2012-09-10 ENCOUNTER — Encounter: Payer: Self-pay | Admitting: Family Medicine

## 2012-09-10 VITALS — BP 112/60 | HR 57 | Temp 98.2°F | Ht 65.0 in | Wt 142.0 lb

## 2012-09-10 DIAGNOSIS — R143 Flatulence: Secondary | ICD-10-CM

## 2012-09-10 DIAGNOSIS — N926 Irregular menstruation, unspecified: Secondary | ICD-10-CM

## 2012-09-10 DIAGNOSIS — R140 Abdominal distension (gaseous): Secondary | ICD-10-CM | POA: Insufficient documentation

## 2012-09-10 DIAGNOSIS — Z0000 Encounter for general adult medical examination without abnormal findings: Secondary | ICD-10-CM

## 2012-09-10 DIAGNOSIS — D72819 Decreased white blood cell count, unspecified: Secondary | ICD-10-CM

## 2012-09-10 LAB — HEPATIC FUNCTION PANEL
ALT: 17 U/L (ref 0–35)
Albumin: 3.9 g/dL (ref 3.5–5.2)
Total Protein: 7.3 g/dL (ref 6.0–8.3)

## 2012-09-10 LAB — CBC WITH DIFFERENTIAL/PLATELET
Basophils Relative: 1.3 % (ref 0.0–3.0)
Eosinophils Absolute: 0 10*3/uL (ref 0.0–0.7)
Eosinophils Relative: 3 % (ref 0.0–5.0)
HCT: 33.3 % — ABNORMAL LOW (ref 36.0–46.0)
Lymphs Abs: 0.5 10*3/uL — ABNORMAL LOW (ref 0.7–4.0)
MCHC: 32.9 g/dL (ref 30.0–36.0)
MCV: 85.3 fl (ref 78.0–100.0)
Monocytes Absolute: 0.2 10*3/uL (ref 0.1–1.0)
Neutro Abs: 0.8 10*3/uL — ABNORMAL LOW (ref 1.4–7.7)
Neutrophils Relative %: 51.6 % (ref 43.0–77.0)
RBC: 3.9 Mil/uL (ref 3.87–5.11)
WBC: 1.5 10*3/uL — CL (ref 4.5–10.5)

## 2012-09-10 LAB — POCT URINALYSIS DIPSTICK
Bilirubin, UA: NEGATIVE
Glucose, UA: NEGATIVE
Ketones, UA: NEGATIVE
Nitrite, UA: NEGATIVE
Spec Grav, UA: 1.015
pH, UA: 6.5

## 2012-09-10 LAB — LIPID PANEL
Cholesterol: 136 mg/dL (ref 0–200)
HDL: 54.1 mg/dL (ref >39.00)
Total CHOL/HDL Ratio: 3
Triglycerides: 50 mg/dL (ref 0.0–149.0)

## 2012-09-10 LAB — BASIC METABOLIC PANEL
CO2: 29 mEq/L (ref 19–32)
Chloride: 104 mEq/L (ref 96–112)
Creatinine, Ser: 0.9 mg/dL (ref 0.4–1.2)
Potassium: 3.4 mEq/L — ABNORMAL LOW (ref 3.5–5.1)

## 2012-09-10 NOTE — Patient Instructions (Addendum)
Preventive Care for Adults, Female A healthy lifestyle and preventive care can promote health and wellness. Preventive health guidelines for women include the following key practices.  A routine yearly physical is a good way to check with your caregiver about your health and preventive screening. It is a chance to share any concerns and updates on your health, and to receive a thorough exam.  Visit your dentist for a routine exam and preventive care every 6 months. Brush your teeth twice a day and floss once a day. Good oral hygiene prevents tooth decay and gum disease.  The frequency of eye exams is based on your age, health, family medical history, use of contact lenses, and other factors. Follow your caregiver's recommendations for frequency of eye exams.  Eat a healthy diet. Foods like vegetables, fruits, whole grains, low-fat dairy products, and lean protein foods contain the nutrients you need without too many calories. Decrease your intake of foods high in solid fats, added sugars, and salt. Eat the right amount of calories for you.Get information about a proper diet from your caregiver, if necessary.  Regular physical exercise is one of the most important things you can do for your health. Most adults should get at least 150 minutes of moderate-intensity exercise (any activity that increases your heart rate and causes you to sweat) each week. In addition, most adults need muscle-strengthening exercises on 2 or more days a week.  Maintain a healthy weight. The body mass index (BMI) is a screening tool to identify possible weight problems. It provides an estimate of body fat based on height and weight. Your caregiver can help determine your BMI, and can help you achieve or maintain a healthy weight.For adults 20 years and older:  A BMI below 18.5 is considered underweight.  A BMI of 18.5 to 24.9 is normal.  A BMI of 25 to 29.9 is considered overweight.  A BMI of 30 and above is  considered obese.  Maintain normal blood lipids and cholesterol levels by exercising and minimizing your intake of saturated fat. Eat a balanced diet with plenty of fruit and vegetables. Blood tests for lipids and cholesterol should begin at age 20 and be repeated every 5 years. If your lipid or cholesterol levels are high, you are over 50, or you are at high risk for heart disease, you may need your cholesterol levels checked more frequently.Ongoing high lipid and cholesterol levels should be treated with medicines if diet and exercise are not effective.  If you smoke, find out from your caregiver how to quit. If you do not use tobacco, do not start.  If you are pregnant, do not drink alcohol. If you are breastfeeding, be very cautious about drinking alcohol. If you are not pregnant and choose to drink alcohol, do not exceed 1 drink per day. One drink is considered to be 12 ounces (355 mL) of beer, 5 ounces (148 mL) of wine, or 1.5 ounces (44 mL) of liquor.  Avoid use of street drugs. Do not share needles with anyone. Ask for help if you need support or instructions about stopping the use of drugs.  High blood pressure causes heart disease and increases the risk of stroke. Your blood pressure should be checked at least every 1 to 2 years. Ongoing high blood pressure should be treated with medicines if weight loss and exercise are not effective.  If you are 55 to 53 years old, ask your caregiver if you should take aspirin to prevent strokes.  Diabetes   screening involves taking a blood sample to check your fasting blood sugar level. This should be done once every 3 years, after age 45, if you are within normal weight and without risk factors for diabetes. Testing should be considered at a younger age or be carried out more frequently if you are overweight and have at least 1 risk factor for diabetes.  Breast cancer screening is essential preventive care for women. You should practice "breast  self-awareness." This means understanding the normal appearance and feel of your breasts and may include breast self-examination. Any changes detected, no matter how small, should be reported to a caregiver. Women in their 20s and 30s should have a clinical breast exam (CBE) by a caregiver as part of a regular health exam every 1 to 3 years. After age 40, women should have a CBE every year. Starting at age 40, women should consider having a mammography (breast X-ray test) every year. Women who have a family history of breast cancer should talk to their caregiver about genetic screening. Women at a high risk of breast cancer should talk to their caregivers about having magnetic resonance imaging (MRI) and a mammography every year.  The Pap test is a screening test for cervical cancer. A Pap test can show cell changes on the cervix that might become cervical cancer if left untreated. A Pap test is a procedure in which cells are obtained and examined from the lower end of the uterus (cervix).  Women should have a Pap test starting at age 21.  Between ages 21 and 29, Pap tests should be repeated every 2 years.  Beginning at age 30, you should have a Pap test every 3 years as long as the past 3 Pap tests have been normal.  Some women have medical problems that increase the chance of getting cervical cancer. Talk to your caregiver about these problems. It is especially important to talk to your caregiver if a new problem develops soon after your last Pap test. In these cases, your caregiver may recommend more frequent screening and Pap tests.  The above recommendations are the same for women who have or have not gotten the vaccine for human papillomavirus (HPV).  If you had a hysterectomy for a problem that was not cancer or a condition that could lead to cancer, then you no longer need Pap tests. Even if you no longer need a Pap test, a regular exam is a good idea to make sure no other problems are  starting.  If you are between ages 65 and 70, and you have had normal Pap tests going back 10 years, you no longer need Pap tests. Even if you no longer need a Pap test, a regular exam is a good idea to make sure no other problems are starting.  If you have had past treatment for cervical cancer or a condition that could lead to cancer, you need Pap tests and screening for cancer for at least 20 years after your treatment.  If Pap tests have been discontinued, risk factors (such as a new sexual partner) need to be reassessed to determine if screening should be resumed.  The HPV test is an additional test that may be used for cervical cancer screening. The HPV test looks for the virus that can cause the cell changes on the cervix. The cells collected during the Pap test can be tested for HPV. The HPV test could be used to screen women aged 30 years and older, and should   be used in women of any age who have unclear Pap test results. After the age of 30, women should have HPV testing at the same frequency as a Pap test.  Colorectal cancer can be detected and often prevented. Most routine colorectal cancer screening begins at the age of 50 and continues through age 75. However, your caregiver may recommend screening at an earlier age if you have risk factors for colon cancer. On a yearly basis, your caregiver may provide home test kits to check for hidden blood in the stool. Use of a small camera at the end of a tube, to directly examine the colon (sigmoidoscopy or colonoscopy), can detect the earliest forms of colorectal cancer. Talk to your caregiver about this at age 50, when routine screening begins. Direct examination of the colon should be repeated every 5 to 10 years through age 75, unless early forms of pre-cancerous polyps or small growths are found.  Hepatitis C blood testing is recommended for all people born from 1945 through 1965 and any individual with known risks for hepatitis C.  Practice  safe sex. Use condoms and avoid high-risk sexual practices to reduce the spread of sexually transmitted infections (STIs). STIs include gonorrhea, chlamydia, syphilis, trichomonas, herpes, HPV, and human immunodeficiency virus (HIV). Herpes, HIV, and HPV are viral illnesses that have no cure. They can result in disability, cancer, and death. Sexually active women aged 25 and younger should be checked for chlamydia. Older women with new or multiple partners should also be tested for chlamydia. Testing for other STIs is recommended if you are sexually active and at increased risk.  Osteoporosis is a disease in which the bones lose minerals and strength with aging. This can result in serious bone fractures. The risk of osteoporosis can be identified using a bone density scan. Women ages 65 and over and women at risk for fractures or osteoporosis should discuss screening with their caregivers. Ask your caregiver whether you should take a calcium supplement or vitamin D to reduce the rate of osteoporosis.  Menopause can be associated with physical symptoms and risks. Hormone replacement therapy is available to decrease symptoms and risks. You should talk to your caregiver about whether hormone replacement therapy is right for you.  Use sunscreen with sun protection factor (SPF) of 30 or more. Apply sunscreen liberally and repeatedly throughout the day. You should seek shade when your shadow is shorter than you. Protect yourself by wearing long sleeves, pants, a wide-brimmed hat, and sunglasses year round, whenever you are outdoors.  Once a month, do a whole body skin exam, using a mirror to look at the skin on your back. Notify your caregiver of new moles, moles that have irregular borders, moles that are larger than a pencil eraser, or moles that have changed in shape or color.  Stay current with required immunizations.  Influenza. You need a dose every fall (or winter). The composition of the flu vaccine  changes each year, so being vaccinated once is not enough.  Pneumococcal polysaccharide. You need 1 to 2 doses if you smoke cigarettes or if you have certain chronic medical conditions. You need 1 dose at age 65 (or older) if you have never been vaccinated.  Tetanus, diphtheria, pertussis (Tdap, Td). Get 1 dose of Tdap vaccine if you are younger than age 65, are over 65 and have contact with an infant, are a healthcare worker, are pregnant, or simply want to be protected from whooping cough. After that, you need a Td   booster dose every 10 years. Consult your caregiver if you have not had at least 3 tetanus and diphtheria-containing shots sometime in your life or have a deep or dirty wound.  HPV. You need this vaccine if you are a woman age 26 or younger. The vaccine is given in 3 doses over 6 months.  Measles, mumps, rubella (MMR). You need at least 1 dose of MMR if you were born in 1957 or later. You may also need a second dose.  Meningococcal. If you are age 19 to 21 and a first-year college student living in a residence hall, or have one of several medical conditions, you need to get vaccinated against meningococcal disease. You may also need additional booster doses.  Zoster (shingles). If you are age 60 or older, you should get this vaccine.  Varicella (chickenpox). If you have never had chickenpox or you were vaccinated but received only 1 dose, talk to your caregiver to find out if you need this vaccine.  Hepatitis A. You need this vaccine if you have a specific risk factor for hepatitis A virus infection or you simply wish to be protected from this disease. The vaccine is usually given as 2 doses, 6 to 18 months apart.  Hepatitis B. You need this vaccine if you have a specific risk factor for hepatitis B virus infection or you simply wish to be protected from this disease. The vaccine is given in 3 doses, usually over 6 months. Preventive Services / Frequency Ages 19 to 39  Blood  pressure check.** / Every 1 to 2 years.  Lipid and cholesterol check.** / Every 5 years beginning at age 20.  Clinical breast exam.** / Every 3 years for women in their 20s and 30s.  Pap test.** / Every 2 years from ages 21 through 29. Every 3 years starting at age 30 through age 65 or 70 with a history of 3 consecutive normal Pap tests.  HPV screening.** / Every 3 years from ages 30 through ages 65 to 70 with a history of 3 consecutive normal Pap tests.  Hepatitis C blood test.** / For any individual with known risks for hepatitis C.  Skin self-exam. / Monthly.  Influenza immunization.** / Every year.  Pneumococcal polysaccharide immunization.** / 1 to 2 doses if you smoke cigarettes or if you have certain chronic medical conditions.  Tetanus, diphtheria, pertussis (Tdap, Td) immunization. / A one-time dose of Tdap vaccine. After that, you need a Td booster dose every 10 years.  HPV immunization. / 3 doses over 6 months, if you are 26 and younger.  Measles, mumps, rubella (MMR) immunization. / You need at least 1 dose of MMR if you were born in 1957 or later. You may also need a second dose.  Meningococcal immunization. / 1 dose if you are age 19 to 21 and a first-year college student living in a residence hall, or have one of several medical conditions, you need to get vaccinated against meningococcal disease. You may also need additional booster doses.  Varicella immunization.** / Consult your caregiver.  Hepatitis A immunization.** / Consult your caregiver. 2 doses, 6 to 18 months apart.  Hepatitis B immunization.** / Consult your caregiver. 3 doses usually over 6 months. Ages 40 to 64  Blood pressure check.** / Every 1 to 2 years.  Lipid and cholesterol check.** / Every 5 years beginning at age 20.  Clinical breast exam.** / Every year after age 40.  Mammogram.** / Every year beginning at age 40   and continuing for as long as you are in good health. Consult with your  caregiver.  Pap test.** / Every 3 years starting at age 30 through age 65 or 70 with a history of 3 consecutive normal Pap tests.  HPV screening.** / Every 3 years from ages 30 through ages 65 to 70 with a history of 3 consecutive normal Pap tests.  Fecal occult blood test (FOBT) of stool. / Every year beginning at age 50 and continuing until age 75. You may not need to do this test if you get a colonoscopy every 10 years.  Flexible sigmoidoscopy or colonoscopy.** / Every 5 years for a flexible sigmoidoscopy or every 10 years for a colonoscopy beginning at age 50 and continuing until age 75.  Hepatitis C blood test.** / For all people born from 1945 through 1965 and any individual with known risks for hepatitis C.  Skin self-exam. / Monthly.  Influenza immunization.** / Every year.  Pneumococcal polysaccharide immunization.** / 1 to 2 doses if you smoke cigarettes or if you have certain chronic medical conditions.  Tetanus, diphtheria, pertussis (Tdap, Td) immunization.** / A one-time dose of Tdap vaccine. After that, you need a Td booster dose every 10 years.  Measles, mumps, rubella (MMR) immunization. / You need at least 1 dose of MMR if you were born in 1957 or later. You may also need a second dose.  Varicella immunization.** / Consult your caregiver.  Meningococcal immunization.** / Consult your caregiver.  Hepatitis A immunization.** / Consult your caregiver. 2 doses, 6 to 18 months apart.  Hepatitis B immunization.** / Consult your caregiver. 3 doses, usually over 6 months. Ages 65 and over  Blood pressure check.** / Every 1 to 2 years.  Lipid and cholesterol check.** / Every 5 years beginning at age 20.  Clinical breast exam.** / Every year after age 40.  Mammogram.** / Every year beginning at age 40 and continuing for as long as you are in good health. Consult with your caregiver.  Pap test.** / Every 3 years starting at age 30 through age 65 or 70 with a 3  consecutive normal Pap tests. Testing can be stopped between 65 and 70 with 3 consecutive normal Pap tests and no abnormal Pap or HPV tests in the past 10 years.  HPV screening.** / Every 3 years from ages 30 through ages 65 or 70 with a history of 3 consecutive normal Pap tests. Testing can be stopped between 65 and 70 with 3 consecutive normal Pap tests and no abnormal Pap or HPV tests in the past 10 years.  Fecal occult blood test (FOBT) of stool. / Every year beginning at age 50 and continuing until age 75. You may not need to do this test if you get a colonoscopy every 10 years.  Flexible sigmoidoscopy or colonoscopy.** / Every 5 years for a flexible sigmoidoscopy or every 10 years for a colonoscopy beginning at age 50 and continuing until age 75.  Hepatitis C blood test.** / For all people born from 1945 through 1965 and any individual with known risks for hepatitis C.  Osteoporosis screening.** / A one-time screening for women ages 65 and over and women at risk for fractures or osteoporosis.  Skin self-exam. / Monthly.  Influenza immunization.** / Every year.  Pneumococcal polysaccharide immunization.** / 1 dose at age 65 (or older) if you have never been vaccinated.  Tetanus, diphtheria, pertussis (Tdap, Td) immunization. / A one-time dose of Tdap vaccine if you are over   65 and have contact with an infant, are a healthcare worker, or simply want to be protected from whooping cough. After that, you need a Td booster dose every 10 years.  Varicella immunization.** / Consult your caregiver.  Meningococcal immunization.** / Consult your caregiver.  Hepatitis A immunization.** / Consult your caregiver. 2 doses, 6 to 18 months apart.  Hepatitis B immunization.** / Check with your caregiver. 3 doses, usually over 6 months. ** Family history and personal history of risk and conditions may change your caregiver's recommendations. Document Released: 09/23/2001 Document Revised: 10/20/2011  Document Reviewed: 12/23/2010 [employer] Patient Information 2013 [employer].  

## 2012-09-10 NOTE — Telephone Encounter (Signed)
Call from the lab advising the patient WBC was 1.5 and platelet count was 102. Please advise     KP

## 2012-09-10 NOTE — Assessment & Plan Note (Signed)
Check [place] abd / pelvis

## 2012-09-10 NOTE — Telephone Encounter (Signed)
Results are not available at this time for review this was called in as a critical result.      KP

## 2012-09-10 NOTE — Telephone Encounter (Signed)
Pt needs f/u with hematology Also please forward to them.  If she has not seen them since 2012---- she may need new referral

## 2012-09-10 NOTE — Progress Notes (Signed)
Subjective:     Jackie Casey is a 52 y.o. female and is here for a comprehensive physical exam. The patient reports problems - irregular periods and bloating.  History   Social History  . Marital Status: Single    Spouse Name: N/A    Number of Children: N/A  . Years of Education: N/A   Occupational History  . ADMINISTRATIVE Soil scientist   Social History Main Topics  . Smoking status: Never Smoker   . Smokeless tobacco: Never Used  . Alcohol Use: 0.0 oz/week     Comment: rare--  1-2 x a month  . Drug Use: No  . Sexually Active: Yes -- Female partner(s)    Birth Control/ Protection: None   Other Topics Concern  . Not on file   Social History Narrative   Exercise-- crunches, jumping jacks etc   Health Maintenance  Topic Date Due  . Influenza Vaccine  04/11/2013  . Mammogram  01/27/2014  . Pap Smear  10/20/2014  . Colonoscopy  12/25/2019  . Tetanus/tdap  12/01/2020    The following portions of the patient's history were reviewed and updated as appropriate:  She  has a past medical history of Trigeminal neuralgia. She  does not have any pertinent problems on file. She  has past surgical history that includes Tonsillectomy. Her family history includes Asthma in her mother; Hypertension in her mother; and Stroke in her mother. She  reports that she has never smoked. She has never used smokeless tobacco. She reports that she drinks alcohol. She reports that she does not use illicit drugs. She has a current medication list which includes the following prescription(s): aspirin, fish oil-omega-3 fatty acids, multivitamin, and pregabalin. Current Outpatient Prescriptions on File Prior to Visit  Medication Sig Dispense Refill  . aspirin 81 MG EC tablet Take 81 mg by mouth. Two times weekly      . fish oil-omega-3 fatty acids 1000 MG capsule Take 1 g by mouth daily.        . Multiple Vitamin (MULTIVITAMIN) tablet Take 1 tablet by mouth daily.        .  pregabalin (LYRICA) 100 MG capsule Take 100 mg by mouth daily.        She  has no known allergies..  Review of Systems Review of Systems  Constitutional: Negative for activity change, appetite change and fatigue.  HENT: Negative for hearing loss, congestion, tinnitus and ear discharge.  dentist q65m Eyes: Negative for visual disturbance (see optho q1y -- vision corrected to 20/20 with glasses).  Respiratory: Negative for cough, chest tightness and shortness of breath.   Cardiovascular: Negative for chest pain, palpitations and leg swelling.  Gastrointestinal: Negative for abdominal pain, diarrhea, constipation and abdominal distention.  Genitourinary: Negative for urgency, frequency, decreased urine volume and difficulty urinating.  Musculoskeletal: Negative for back pain, arthralgias and gait problem.  Skin: Negative for color change, pallor and rash.  Neurological: Negative for dizziness, light-headedness, numbness and headaches.  Hematological: Negative for adenopathy. Does not bruise/bleed easily.  Psychiatric/Behavioral: Negative for suicidal ideas, confusion, sleep disturbance, self-injury, dysphoric mood, decreased concentration and agitation.       Objective:    BP 112/60  Pulse 57  Temp 98.2 F (36.8 C) (Oral)  Ht 5\' 5"  (1.651 m)  Wt 142 lb (64.411 kg)  BMI 23.63 kg/m2  SpO2 98%  LMP 09/07/2012 General appearance: alert, cooperative, appears stated age and no distress Head: Normocephalic, without obvious abnormality, atraumatic Eyes:  conjunctivae/corneas clear. PERRL, EOM's intact. Fundi benign. Ears: normal TM's and external ear canals both ears Nose: Nares normal. Septum midline. Mucosa normal. No drainage or sinus tenderness. Throat: lips, mucosa, and tongue normal; teeth and gums normal Neck: no adenopathy, no carotid bruit, no JVD, supple, symmetrical, trachea midline and thyroid not enlarged, symmetric, no tenderness/mass/nodules Back: symmetric, no curvature.  ROM normal. No CVA tenderness. Lungs: clear to auscultation bilaterally Breasts: normal appearance, no masses or tenderness Heart: regular rate and rhythm, S1, S2 normal, no murmur, click, rub or gallop Abdomen: soft, non-tender; bowel sounds normal; no masses,  no organomegaly Pelvic: deferred---on menses Extremities: extremities normal, atraumatic, no cyanosis or edema Pulses: 2+ and symmetric Skin: Skin color, texture, turgor normal. No rashes or lesions Lymph nodes: Cervical, supraclavicular, and axillary nodes normal. Neurologic: Alert and oriented X 3, normal strength and tone. Normal symmetric reflexes. Normal coordination and gait psych-- no depression, no anxiety    Assessment:    Healthy female exam.      Plan:    rto for pap Check labs ghm utd See After Visit Summary for Counseling Recommendations

## 2012-09-13 ENCOUNTER — Other Ambulatory Visit: Payer: 59

## 2012-09-13 ENCOUNTER — Ambulatory Visit (INDEPENDENT_AMBULATORY_CARE_PROVIDER_SITE_OTHER): Payer: 59 | Admitting: Family Medicine

## 2012-09-13 ENCOUNTER — Encounter: Payer: Self-pay | Admitting: Family Medicine

## 2012-09-13 ENCOUNTER — Other Ambulatory Visit (HOSPITAL_COMMUNITY)
Admission: RE | Admit: 2012-09-13 | Discharge: 2012-09-13 | Disposition: A | Discharge status: Home / Self Care | Payer: 59 | Source: Ambulatory Visit | Attending: Family Medicine | Admitting: Family Medicine

## 2012-09-13 VITALS — BP 110/70 | HR 78 | Temp 98.3°F | Wt 142.0 lb

## 2012-09-13 DIAGNOSIS — Z01419 Encounter for gynecological examination (general) (routine) without abnormal findings: Secondary | ICD-10-CM | POA: Insufficient documentation

## 2012-09-13 DIAGNOSIS — Z124 Encounter for screening for malignant neoplasm of cervix: Secondary | ICD-10-CM

## 2012-09-13 NOTE — Patient Instructions (Signed)

## 2012-09-13 NOTE — Progress Notes (Signed)
  Subjective:     Jackie Casey is a 52 y.o. woman who comes in today for a  pap smear only. Her most recent annual exam was on  09/10/2012 . Her most recent Pap smear was on 10/20/2011 and showed no abnormalities. Previous abnormal Pap smears: no. Contraception: none  The following portions of the patient's history were reviewed and updated as appropriate: allergies, current medications, past family history, past medical history, past social history, past surgical history and problem list.  Review of Systems Pertinent items are noted in HPI.   Objective:    BP 110/70  Pulse 78  Temp 98.3 F (36.8 C) (Oral)  Wt 142 lb (64.411 kg)  SpO2 97%  LMP 09/07/2012 Pelvic Exam: cervix normal in appearance, external genitalia normal, no adnexal masses or tenderness, no bladder tenderness, no cervical motion tenderness, rectovaginal septum normal, uterus normal size, shape, and consistency and vagina normal without discharge. Pap smear obtained.  Breast exam--- no nipple d/c , no axillary nodes , no masses palpated Assessment:    Screening pap smear.   Plan:    Follow up in 1 year, or as indicated by Pap results.

## 2012-09-16 ENCOUNTER — Ambulatory Visit
Admission: RE | Admit: 2012-09-16 | Discharge: 2012-09-16 | Disposition: A | Discharge status: Home / Self Care | Payer: 59 | Source: Ambulatory Visit | Attending: Family Medicine | Admitting: Family Medicine

## 2012-09-16 ENCOUNTER — Telehealth: Payer: Self-pay | Admitting: Hematology & Oncology

## 2012-09-16 DIAGNOSIS — N926 Irregular menstruation, unspecified: Secondary | ICD-10-CM

## 2012-09-16 DIAGNOSIS — R140 Abdominal distension (gaseous): Secondary | ICD-10-CM

## 2012-09-16 NOTE — Telephone Encounter (Signed)
Pt aware of 3-28 appointment 

## 2012-09-20 ENCOUNTER — Telehealth: Payer: Self-pay

## 2012-09-20 DIAGNOSIS — D219 Benign neoplasm of connective and other soft tissue, unspecified: Secondary | ICD-10-CM

## 2012-09-20 NOTE — Telephone Encounter (Addendum)
Message copied by Arnette Norris on Mon Sep 20, 2012 11:57 AM ------      Message from: Lelon Perla      Created: Thu Sep 16, 2012 11:52 AM       + fibroids-- refer to gyn      Discussed with patient and she voiced understanding. Ref put in.      KP

## 2012-10-08 ENCOUNTER — Other Ambulatory Visit (HOSPITAL_BASED_OUTPATIENT_CLINIC_OR_DEPARTMENT_OTHER): Payer: 59 | Admitting: Lab

## 2012-10-08 ENCOUNTER — Ambulatory Visit (HOSPITAL_BASED_OUTPATIENT_CLINIC_OR_DEPARTMENT_OTHER): Payer: 59 | Admitting: Hematology & Oncology

## 2012-10-08 VITALS — BP 174/86 | HR 97 | Temp 97.6°F | Ht 65.0 in | Wt 144.0 lb

## 2012-10-08 LAB — CBC WITH DIFFERENTIAL (CANCER CENTER ONLY)
BASO#: 0 10*3/uL (ref 0.0–0.2)
Eosinophils Absolute: 0 10*3/uL (ref 0.0–0.5)
HGB: 11.3 g/dL — ABNORMAL LOW (ref 11.6–15.9)
LYMPH%: 29.8 % (ref 14.0–48.0)
MCH: 28.5 pg (ref 26.0–34.0)
MCV: 86 fL (ref 81–101)
MONO#: 0.3 10*3/uL (ref 0.1–0.9)
Platelets: 102 10*3/uL — ABNORMAL LOW (ref 145–400)
RBC: 3.97 10*6/uL (ref 3.70–5.32)
WBC: 2.2 10*3/uL — ABNORMAL LOW (ref 3.9–10.0)

## 2012-10-08 LAB — CHCC SATELLITE - SMEAR

## 2012-10-08 NOTE — Progress Notes (Signed)
This office note has been dictated.

## 2012-10-08 NOTE — Addendum Note (Signed)
Addended by: Josph Macho on: 10/08/2012 06:56 PM   Modules accepted: Orders

## 2012-10-09 NOTE — Progress Notes (Signed)
CC:   Jackie Perla, DO  DIAGNOSES: 1. Chronic leukopenia and thrombocytopenia. 2. ANA positive.  CURRENT THERAPY:  Observation.  INTERIM HISTORY:  Jackie Casey comes in for followup.  It has been 2 years since we last saw her.  I initially saw her back in, I think, 2011.  At that point in time, we did some lab work on her.  Her CBC at that time showed a white cell count 2.2, hemoglobin 11.6 and platelet count was 128.  She was evaluated for ANA and other collagen vascular diseases.  Her ANA was mildly positive.  She had a negative rheumatoid factor.  She is not carrying any obvious risk factors for hepatitis or HIV.  She apparently had lab work done recently.  This showed that her white cell count was, I think, 1.5.  She has had no problem with infections.  There has been no bleeding or bruising.  There has been no cough or shortness of breath.  She is still working without any difficulties.  She has had no foreign travel.  PHYSICAL EXAMINATION:  General:  This is a well-developed, well- nourished African American female in no obvious distress.  Vital signs: Show temperature of 97.6, pulse 97, respiratory rate 20, blood pressure 174/86.  Weight is 144.  Head and neck:  Shows a normocephalic, atraumatic skull.  There are no ocular or oral lesions.  There are no palpable cervical or supraclavicular lymph nodes.  Lungs:  Clear bilaterally.  Cardiac:  Regular rate and rhythm with a normal S1, S2. There are no murmurs, rubs or bruits.  Abdomen:  Soft with good bowel sounds.  There is no fluid wave.  There is no palpable abdominal mass. There is no palpable hepatosplenomegaly.  Extremities:  Show no clubbing, cyanosis or edema.  Neurological:  Shows no focal neurological deficits.  Skin:  No rashes, ecchymoses or petechiae.  LABORATORY STUDIES:  White cell count is 2.3, hemoglobin 11.3, hematocrit 34.1, platelet count 102.  MCV is 86.  Peripheral smear shows a normochromic,  normocytic population of red blood cells.  There are no nucleated red blood cells.  I see no new rouleaux formation.  There are no target cells.  There are no schistocytes.  White cells are somewhat decreased in number.  She has no hypersegmented polys.  There are no atypical lymphocytes.  She has no blasts.  Platelets are mildly decreased in number.  She has numerous large platelets.  IMPRESSION:  Jackie Casey is a very nice 52 year old African American female with leukopenia and thrombocytopenia.  This definitely is a chronic issue.  She says that she had a bone marrow test a long time ago.  This was before I think she got in the [place] area.  Her blood smear certainly is reassuring to me.  I want to get her back in 2 months.  I do not see that she has been checked for hepatitis or HIV.  I probably do need to make sure that these are checked.  If all of our studies are normal, and she has no improvement in her blood counts, then we may end up having to do a bone marrow test on her. The patient does have a mildly positive ANA.  I suppose that she may have an autoimmune issue that could be causing the leukopenia and thrombocytopenia.  I think that the main point is that she is asymptomatic.  I could not find anything on her physical exam, nor was there anything on  her blood smear.  We will go ahead and plan to get her back in 2 more months.  At that point in time, we will then make a determination as to whether or not she needs a bone marrow test.    ______________________________ Jackie Casey, M.D. PRE/MEDQ  D:  10/08/2012  T:  10/09/2012  Job:  [id number]

## 2012-10-28 ENCOUNTER — Encounter: Payer: Self-pay | Admitting: Internal Medicine

## 2012-10-28 ENCOUNTER — Ambulatory Visit (INDEPENDENT_AMBULATORY_CARE_PROVIDER_SITE_OTHER): Payer: 59 | Admitting: Internal Medicine

## 2012-10-28 VITALS — BP 140/82 | HR 79 | Temp 99.2°F | Wt 142.0 lb

## 2012-10-28 DIAGNOSIS — J209 Acute bronchitis, unspecified: Secondary | ICD-10-CM

## 2012-10-28 DIAGNOSIS — J029 Acute pharyngitis, unspecified: Secondary | ICD-10-CM

## 2012-10-28 LAB — POCT RAPID STREP A (OFFICE): Rapid Strep A Screen: NEGATIVE

## 2012-10-28 MED ORDER — FLUTICASONE PROPIONATE 50 MCG/ACT NA SUSP: 1.0000 | Freq: Two times a day (BID) | NASAL | Status: DC | PRN

## 2012-10-28 MED ORDER — AZITHROMYCIN 250 MG PO TABS: ORAL_TABLET | ORAL | Status: DC

## 2012-10-28 NOTE — Patient Instructions (Addendum)

## 2012-10-28 NOTE — Progress Notes (Signed)
  Subjective:    Patient ID: Jackie Casey, female    DOB: 1961/01/25, 52 y.o.   MRN: [id number]  HPI The respiratory tract symptoms began  10/26/12 with sore throat.   Significant active associated symptoms include head congestion, watery eyes, sneezing, myalgias,  chest congestion, productive cough with yellow sputum.  No sick family, friends, exposures reported.  No allergens or environmental factors as triggers .       Flu shot not current  Treatment with NSAIDS, Airborne  was partially effective. The patient has never smoked   There is no history of asthma,  seasonal or perennial allergies.       Review of Systems Symptoms not present include frontal headache, facial pain, nasal purulence,dental pain,  earache, &  otic discharge. Fever, and sweats were not present . Itchy/ watery eyes &  sneezing were not noted.       Objective:   Physical Exam General appearance: fatigued; no acute distress or increased work of breathing is present.   Eyes: No conjunctival inflammation or lid edema is present.  Ears:  External ear exam shows no significant lesions or deformities.  Otoscopic examination reveals clear canals, tympanic membranes are intact bilaterally without bulging, retraction, inflammation or discharge. Nose:  External nasal examination shows no deformity or inflammation. Nasal mucosa are boggy & moist without lesions or exudates. No septal dislocation or deviation.No obstruction to airflow in right nare and moderate obstruction in left nare Oral exam: Dental hygiene is good; lips and gums are healthy appearing.There is no oropharyngeal erythema or exudate noted.  Neck:  No deformities, masses, or tenderness noted.   Supple with full range of motion without pain.  Heart:  Normal rate and regular rhythm. S1 and S2 normal without gallop, click, rub or murmur.  Lungs:Chest clear to auscultation; no wheezes, rhonchi,rales ,or rubs present.No increased work of breathing.    Extremities:  No cyanosis, edema, or clubbing  noted  No  lymphadenopathy about the head, neck, or axilla noted.  Skin: Warm & dry         Assessment & Plan:  #1 acute bronchitis w/o bronchospasm #2 extrinsic type nasal changes Plan: See orders and recommendations

## 2012-12-03 ENCOUNTER — Other Ambulatory Visit (HOSPITAL_BASED_OUTPATIENT_CLINIC_OR_DEPARTMENT_OTHER): Payer: 59 | Admitting: Lab

## 2012-12-03 ENCOUNTER — Ambulatory Visit (HOSPITAL_BASED_OUTPATIENT_CLINIC_OR_DEPARTMENT_OTHER): Payer: 59 | Admitting: Hematology & Oncology

## 2012-12-03 VITALS — BP 124/64 | HR 61 | Temp 98.4°F | Resp 16 | Ht 65.0 in | Wt 143.0 lb

## 2012-12-03 DIAGNOSIS — D7289 Other specified disorders of white blood cells: Secondary | ICD-10-CM

## 2012-12-03 DIAGNOSIS — D696 Thrombocytopenia, unspecified: Secondary | ICD-10-CM

## 2012-12-03 DIAGNOSIS — D72819 Decreased white blood cell count, unspecified: Secondary | ICD-10-CM

## 2012-12-03 LAB — CBC WITH DIFFERENTIAL (CANCER CENTER ONLY)
BASO#: 0 10*3/uL (ref 0.0–0.2)
Eosinophils Absolute: 0 10*3/uL (ref 0.0–0.5)
HCT: 34.1 % — ABNORMAL LOW (ref 34.8–46.6)
HGB: 11.2 g/dL — ABNORMAL LOW (ref 11.6–15.9)
LYMPH#: 0.7 10*3/uL — ABNORMAL LOW (ref 0.9–3.3)
MCH: 28.6 pg (ref 26.0–34.0)
NEUT#: 0.9 10*3/uL — ABNORMAL LOW (ref 1.5–6.5)
NEUT%: 46.8 % (ref 39.6–80.0)
RBC: 3.92 10*6/uL (ref 3.70–5.32)

## 2012-12-03 NOTE — Progress Notes (Signed)
This office note has been dictated.

## 2012-12-04 NOTE — Progress Notes (Signed)
CC:   Lelon Perla, DO  DIAGNOSES: 1. Leukopenia and thrombocytopenia. 2. Positive antinuclear antibodies.  CURRENT THERAPY:  Observation.  INTERIM HISTORY:  Mr. Jackie Casey comes in for followup.  She is doing okay. She is a little bit tired.  She has go to go back to work after she sees [place].  She says she is having the start of her monthly cycle.  She says that when this happens, her blood counts typically go down and she feels somewhat poorly.  We have checked her before.  We did check ANA on her.  This was mildly positive when we did this about 3 years ago.  She has not had any problems with bleeding or bruising.  There have been no infections.  She got through the wintertime without any issues.  She has had no rashes.  There has been no abdominal pain.  There has been no change in bowel or bladder habits.  PHYSICAL EXAMINATION:  General:  This is a well-developed, well- nourished African American female in no obvious distress.  Vital signs: Temperature 98.4, pulse 61, respiratory rate 16, blood pressure 124/64. Weight is 143.  Head and neck:  Normocephalic, atraumatic skull.  There are no ocular or oral lesions.  There are no palpable cervical or supraclavicular lymph nodes.  Lungs:  Clear bilaterally.  Cardiac: Regular rate and rhythm with a normal S1 and S2.  There are no murmurs, rubs or bruits.  Abdomen:  Soft with good bowel sounds.  There is no palpable abdominal mass.  There is no fluid wave.  There is no palpable hepatosplenomegaly.  Back:  No tenderness over the spine, ribs, or hips. Extremities:  Show no clubbing, cyanosis, or edema.  Neurological: Shows no focal neurological deficit.  LABORATORY STUDIES:  White cell count is 1.9, hemoglobin 11.2, hematocrit 34.1, platelet count 118.  White cell differential shows 47 segs, 38 lymphs, 13 monos, 2 eosinophils.  Peripheral smear shows a normochromic, normocytic population of red blood cells.  There are no nucleated  red blood cells.  There are no teardrop cells.  I see no rouleaux formation.  She has no target cells or inclusion bodies.  White cells appear moderately decreased in number. She has good maturation of her white blood cells.  I do not see any hypersegmented polys.  There are no atypical lymphocytes.  I see no immature myeloid cells.  There are no blasts.  Platelets are mildly decreased in number.  She has a few large platelets.  IMPRESSION:  Ms. Vanevery is a very nice 52 year old African American female with leukopenia and thrombocytopenia.  Thrombocytopenia is mild. It is slightly improved from when we saw her 2 months ago.  The white cell count is trending downward more.  She has a normal differential.  I do not see anything on her blood smear that looked unusual.  She says that when she has her monthly cycle, that her blood counts do tend to drop.  I am sending off a hepatitis panel, HIV, and another antinuclear antibody test.  I just want to see what these look like now.  I still do not want to get too aggressive here.  She is asymptomatic.  I want to get her back in a month or so.  I want to make sure that when we see her back, she is not going to have her monthly cycle.  If her platelets and white cell count have not improved, then I think we are going to have  to consider her for a bone marrow test.  Ms. Asencio certainly understands this.    ______________________________ Josph Macho, M.D. PRE/MEDQ  D:  12/03/2012  T:  12/04/2012  Job:  [id number]

## 2012-12-06 LAB — HIV ANTIBODY (ROUTINE TESTING W REFLEX): HIV: NONREACTIVE

## 2012-12-06 LAB — HEPATITIS PANEL, ACUTE
HCV Ab: NEGATIVE
Hep A IgM: NEGATIVE
Hep B C IgM: NEGATIVE
Hepatitis B Surface Ag: NEGATIVE

## 2013-01-19 ENCOUNTER — Other Ambulatory Visit: Payer: 59 | Admitting: Lab

## 2013-01-19 ENCOUNTER — Ambulatory Visit: Payer: 59 | Admitting: Hematology & Oncology

## 2013-01-19 ENCOUNTER — Ambulatory Visit: Payer: 59 | Admitting: Medical

## 2013-01-19 ENCOUNTER — Telehealth: Payer: Self-pay | Admitting: Hematology & Oncology

## 2013-01-19 NOTE — Telephone Encounter (Signed)
PT CALLED CX 6-11 SAID WOULD CALL BACK TO RESCHEDULE.

## 2013-02-16 ENCOUNTER — Telehealth: Payer: Self-pay | Admitting: *Deleted

## 2013-02-16 NOTE — Telephone Encounter (Signed)
Patient has not been seen since in May 2012.  She will need to schedule an appt.  As well, she has a CB.  Her PAP forms are pending.  They can take up to 6 weeks to process through the manufacturer. Lyrica is a narcotic, and at times takes longer to process than non narcotic medications.  I called the patient.  Explained she needs to schedule appt, and forms are pending.  She says she was told she never has to be seen again.  Advised her we do need her to come in for an OV at least once every 12 months.  She verbalized understanding.  Sent message to accounting dept to contact patient regarding balance owed.  Once this is taken care of, schedulers may make appt.

## 2013-02-18 ENCOUNTER — Ambulatory Visit (INDEPENDENT_AMBULATORY_CARE_PROVIDER_SITE_OTHER): Payer: 59 | Admitting: Family Medicine

## 2013-02-18 ENCOUNTER — Encounter: Payer: Self-pay | Admitting: Family Medicine

## 2013-02-18 VITALS — BP 118/74 | HR 59 | Temp 98.5°F | Wt 144.2 lb

## 2013-02-18 DIAGNOSIS — D259 Leiomyoma of uterus, unspecified: Secondary | ICD-10-CM

## 2013-02-18 DIAGNOSIS — G500 Trigeminal neuralgia: Secondary | ICD-10-CM

## 2013-02-18 DIAGNOSIS — D219 Benign neoplasm of connective and other soft tissue, unspecified: Secondary | ICD-10-CM

## 2013-02-18 NOTE — Progress Notes (Signed)
  Subjective:    Patient ID: Jackie Casey, female    DOB: February 17, 1961, 52 y.o.   MRN: [id number]  HPI Pt here to get lyrica refilled for her trigeminal neuralgia.  No new complaints.   Review of Systems As above    Objective:   Physical Exam BP 118/74  Pulse 59  Temp(Src) 98.5 F (36.9 C) (Oral)  Wt 144 lb 3.2 oz (65.409 kg)  BMI 24 kg/m2  SpO2 99% General appearance: alert, cooperative, appears stated age and no distress        Assessment & Plan:

## 2013-02-18 NOTE — Assessment & Plan Note (Signed)
Refill lyrica

## 2013-02-18 NOTE — Patient Instructions (Signed)
Trigeminal Neuralgia Trigeminal neuralgia is a nerve disorder that causes sudden attacks of severe facial pain. It is caused by damage to the trigeminal nerve, a major nerve in the face. It is more common in women and in the elderly, although it can also happen in younger patients. Attacks last from a few seconds to several minutes and can occur from a couple of times per year to several times per day. Trigeminal neuralgia can be a very distressing and disabling condition. Surgery may be needed in very severe cases if medical treatment does not give relief. HOME CARE INSTRUCTIONS   If your caregiver prescribed medication to help prevent attacks, take as directed.  To help prevent attacks:  Chew on the unaffected side of the mouth.  Avoid touching your face.  Avoid blasts of hot or cold air.  Men may wish to grow a beard to avoid having to shave. SEEK IMMEDIATE MEDICAL CARE IF:  Pain is unbearable and your medicine does not help.  You develop new, unexplained symptoms (problems).  You have problems that may be related to a medication you are taking. Document Released: 07/25/2000 Document Revised: 10/20/2011 Document Reviewed: 05/25/2009 Exit[employer] Patient Information 2014 [employer].  

## 2013-03-10 ENCOUNTER — Telehealth: Payer: Self-pay | Admitting: Family Medicine

## 2013-03-10 NOTE — Telephone Encounter (Signed)
Vicky from [employer] called stating they received the financial assistance paperwork, but did not receive the rx with it. She is requesting that we fax rx for Lyrica with original signature to [phone].

## 2013-03-14 MED ORDER — PREGABALIN 100 MG PO CAPS: 100.0000 mg | ORAL_CAPSULE | Freq: Two times a day (BID) | ORAL | Status: DC

## 2013-03-14 NOTE — Telephone Encounter (Signed)
Spoke with [employer] and I will need the original providers sig. I will faxed on Thursday. Rx printed     KP

## 2013-03-17 ENCOUNTER — Telehealth: Payer: Self-pay | Admitting: Family Medicine

## 2013-03-17 NOTE — Telephone Encounter (Signed)
Please disregard below. Just pulled up phone note from 03/10/13. Patient informed that this as already been addressed.

## 2013-03-17 NOTE — Telephone Encounter (Addendum)
Patient states that she received a letter from [employer] that says that her original Lyrica Rx needs to be sent with her application for them. SHe says that all documents must be re-submitted with it in order for them to process her paperwork.

## 2013-03-17 NOTE — Telephone Encounter (Signed)
Noted  KP 

## 2013-03-30 ENCOUNTER — Encounter: Payer: Self-pay | Admitting: Family Medicine

## 2013-03-31 ENCOUNTER — Telehealth: Payer: Self-pay | Admitting: Family Medicine

## 2013-03-31 MED ORDER — PREGABALIN 100 MG PO CAPS: 100.0000 mg | ORAL_CAPSULE | Freq: Two times a day (BID) | ORAL | Status: DC

## 2013-03-31 NOTE — Telephone Encounter (Signed)
Patient states [employer] still has not received original rx for Lyrica with her patient assistance paperwork. Fax # [phone]

## 2013-03-31 NOTE — Telephone Encounter (Signed)
This is the third time that I am sending this information to [employer].     Faxed to the number below.

## 2013-06-08 ENCOUNTER — Encounter: Payer: Self-pay | Admitting: Family Medicine

## 2013-06-16 ENCOUNTER — Other Ambulatory Visit: Payer: Self-pay

## 2013-09-23 ENCOUNTER — Telehealth: Payer: Self-pay

## 2013-09-23 NOTE — Telephone Encounter (Signed)
Medication and allergies:  Reviewed and updated  90 day supply/mail order: n/a [facility] [address]    Immunizations due:  UTD   A/P: No changes to personal, family history or past surgical hx PAP- 09/13/12- normal [facility]CS- 12/24/09- negative [facility]G- 02/01/13- negative- [facility] Flu- 07/2013 Tdap-12/02/10   To Discuss with Provider: Not at this time.

## 2013-09-26 ENCOUNTER — Other Ambulatory Visit (HOSPITAL_COMMUNITY)
Admission: RE | Admit: 2013-09-26 | Discharge: 2013-09-26 | Disposition: A | Discharge status: Home / Self Care | Payer: 59 | Source: Ambulatory Visit | Attending: Family Medicine | Admitting: Family Medicine

## 2013-09-26 ENCOUNTER — Ambulatory Visit (INDEPENDENT_AMBULATORY_CARE_PROVIDER_SITE_OTHER): Payer: 59 | Admitting: Family Medicine

## 2013-09-26 ENCOUNTER — Encounter: Payer: Self-pay | Admitting: Family Medicine

## 2013-09-26 VITALS — BP 118/76 | Temp 98.7°F | Resp 16 | Ht 65.0 in | Wt 142.4 lb

## 2013-09-26 DIAGNOSIS — G500 Trigeminal neuralgia: Secondary | ICD-10-CM

## 2013-09-26 DIAGNOSIS — N390 Urinary tract infection, site not specified: Secondary | ICD-10-CM

## 2013-09-26 DIAGNOSIS — Z1151 Encounter for screening for human papillomavirus (HPV): Secondary | ICD-10-CM | POA: Insufficient documentation

## 2013-09-26 DIAGNOSIS — Z01419 Encounter for gynecological examination (general) (routine) without abnormal findings: Secondary | ICD-10-CM | POA: Insufficient documentation

## 2013-09-26 DIAGNOSIS — Z124 Encounter for screening for malignant neoplasm of cervix: Secondary | ICD-10-CM

## 2013-09-26 DIAGNOSIS — Z0000 Encounter for general adult medical examination without abnormal findings: Secondary | ICD-10-CM

## 2013-09-26 LAB — CBC WITH DIFFERENTIAL/PLATELET
BASOS PCT: 0.4 % (ref 0.0–3.0)
Basophils Absolute: 0 10*3/uL (ref 0.0–0.1)
EOS PCT: 1.9 % (ref 0.0–5.0)
Eosinophils Absolute: 0 10*3/uL (ref 0.0–0.7)
HEMATOCRIT: 36.5 % (ref 36.0–46.0)
Hemoglobin: 11.5 g/dL — ABNORMAL LOW (ref 12.0–15.0)
Lymphocytes Relative: 27 % (ref 12.0–46.0)
Lymphs Abs: 0.5 10*3/uL — ABNORMAL LOW (ref 0.7–4.0)
MCHC: 31.6 g/dL (ref 30.0–36.0)
MCV: 88.9 fl (ref 78.0–100.0)
MONO ABS: 0.3 10*3/uL (ref 0.1–1.0)
Monocytes Relative: 13.9 % — ABNORMAL HIGH (ref 3.0–12.0)
NEUTROS PCT: 56.8 % (ref 43.0–77.0)
Neutro Abs: 1.1 10*3/uL — ABNORMAL LOW (ref 1.4–7.7)
Platelets: 72 10*3/uL — ABNORMAL LOW (ref 150.0–400.0)
RBC: 4.1 Mil/uL (ref 3.87–5.11)
RDW: 13 % (ref 11.5–14.6)

## 2013-09-26 LAB — HEPATIC FUNCTION PANEL
ALT: 15 U/L (ref 0–35)
AST: 16 U/L (ref 0–37)
Albumin: 4 g/dL (ref 3.5–5.2)
Alkaline Phosphatase: 26 U/L — ABNORMAL LOW (ref 39–117)
BILIRUBIN TOTAL: 0.7 mg/dL (ref 0.3–1.2)
Bilirubin, Direct: 0 mg/dL (ref 0.0–0.3)
Total Protein: 7.7 g/dL (ref 6.0–8.3)

## 2013-09-26 LAB — BASIC METABOLIC PANEL
BUN: 15 mg/dL (ref 6–23)
CHLORIDE: 104 meq/L (ref 96–112)
CO2: 26 mEq/L (ref 19–32)
CREATININE: 0.8 mg/dL (ref 0.4–1.2)
Calcium: 8.7 mg/dL (ref 8.4–10.5)
GFR: 96.1 mL/min (ref >60.00)
Glucose, Bld: 76 mg/dL (ref 70–99)
POTASSIUM: 3.3 meq/L — AB (ref 3.5–5.1)
Sodium: 136 mEq/L (ref 135–145)

## 2013-09-26 LAB — TSH: TSH: 0.38 u[IU]/mL (ref 0.35–5.50)

## 2013-09-26 LAB — LIPID PANEL
CHOL/HDL RATIO: 3
CHOLESTEROL: 149 mg/dL (ref 0–200)
HDL: 56.4 mg/dL (ref >39.00)
LDL CALC: 83 mg/dL (ref 0–99)
TRIGLYCERIDES: 50 mg/dL (ref 0.0–149.0)
VLDL: 10 mg/dL (ref 0.0–40.0)

## 2013-09-26 NOTE — Patient Instructions (Signed)

## 2013-09-26 NOTE — Progress Notes (Signed)
Pre visit review using our clinic review tool, if applicable. No additional management support is needed unless otherwise documented below in the visit note. 

## 2013-09-26 NOTE — Progress Notes (Signed)
Subjective:     Jackie Casey is a 53 y.o. female and is here for a comprehensive physical exam. The patient reports no problems.  History   Social History  . Marital Status: Single    Spouse Name: N/A    Number of Children: N/A  . Years of Education: N/A   Occupational History  . ADMINISTRATIVE Paramedic   Social History Main Topics  . Smoking status: Never Smoker   . Smokeless tobacco: Never Used  . Alcohol Use: 0.0 oz/week     Comment: rare--  1-2 x a month  . Drug Use: No  . Sexual Activity: Yes    Partners: Male    Birth Control/ Protection: None   Other Topics Concern  . Not on file   Social History Narrative   Exercise-- crunches, jumping jacks etc   Health Maintenance  Topic Date Due  . Influenza Vaccine  03/11/2014  . Mammogram  02/02/2015  . Pap Smear  09/14/2015  . Colonoscopy  12/25/2019  . Tetanus/tdap  12/01/2020    The following portions of the patient's history were reviewed and updated as appropriate:  She  has a past medical history of Trigeminal neuralgia. She  does not have any pertinent problems on file. She  has past surgical history that includes Tonsillectomy. Her family history includes Asthma in her mother; Hypertension in her mother; Stroke in her mother. She  reports that she has never smoked. She has never used smokeless tobacco. She reports that she drinks alcohol. She reports that she does not use illicit drugs. She has a current medication list which includes the following prescription(s): aspirin, multivitamin, and pregabalin. Current Outpatient Prescriptions on File Prior to Visit  Medication Sig Dispense Refill  . aspirin 81 MG EC tablet Take 81 mg by mouth. Two times weekly      . Multiple Vitamin (MULTIVITAMIN) tablet Take 1 tablet by mouth daily.        . pregabalin (LYRICA) 100 MG capsule Take 1 capsule (100 mg total) by mouth 2 (two) times daily.  180 capsule  3   No current  facility-administered medications on file prior to visit.   She has No Known Allergies..  Review of Systems Review of Systems  Constitutional: Negative for activity change, appetite change and fatigue.  HENT: Negative for hearing loss, congestion, tinnitus and ear discharge.  dentist q26m Eyes: Negative for visual disturbance (see optho q1y -- vision corrected to 20/20 with glasses).  Respiratory: Negative for cough, chest tightness and shortness of breath.   Cardiovascular: Negative for chest pain, palpitations and leg swelling.  Gastrointestinal: Negative for abdominal pain, diarrhea, constipation and abdominal distention.  Genitourinary: Negative for urgency, frequency, decreased urine volume and difficulty urinating.  Musculoskeletal: Negative for back pain, arthralgias and gait problem.  Skin: Negative for color change, pallor and rash.  Neurological: Negative for dizziness, light-headedness, numbness and headaches.  Hematological: Negative for adenopathy. Does not bruise/bleed easily.  Psychiatric/Behavioral: Negative for suicidal ideas, confusion, sleep disturbance, self-injury, dysphoric mood, decreased concentration and agitation.       Objective:    BP 118/76  Temp(Src) 98.7 F (37.1 C) (Oral)  Resp 16  Ht 5\' 5"  (1.651 m)  Wt 142 lb 6.4 oz (64.592 kg)  BMI 23.70 kg/m2  LMP 09/02/2013 General appearance: alert, cooperative, appears stated age and no distress Head: Normocephalic, without obvious abnormality Eyes: conjunctivae/corneas clear. PERRL, EOM's intact. Fundi benign. Ears: normal TM's and external ear canals  both ears Nose: Nares normal. Septum midline. Mucosa normal. No drainage or sinus tenderness. Throat: lips, mucosa, and tongue normal; teeth and gums normal Neck: no adenopathy, supple, symmetrical, trachea midline and thyroid not enlarged, symmetric, no tenderness/mass/nodules Back: symmetric, no curvature. ROM normal. No CVA tenderness. Lungs: clear to  auscultation bilaterally Breasts: normal appearance, no masses or tenderness Heart: regular rate and rhythm, S1, S2 normal, no murmur, click, rub or gallop Abdomen: soft, non-tender; bowel sounds normal; no masses,  no organomegaly Pelvic: cervix normal in appearance, external genitalia normal, no adnexal masses or tenderness, no cervical motion tenderness, rectovaginal septum normal, uterus normal size, shape, and consistency, vagina normal without discharge and pap done Extremities: extremities normal, atraumatic, no cyanosis or edema Pulses: 2+ and symmetric Skin: Skin color, texture, turgor normal. No rashes or lesions Lymph nodes: Cervical, supraclavicular, and axillary nodes normal. Neurologic: Alert and oriented X 3, normal strength and tone. Normal symmetric reflexes. Normal coordination and gait Psych-- no depression, no anxiety      Assessment:    Healthy female exam.      Plan:    ghm utd Check labs See After Visit Summary for Counseling Recommendations

## 2013-09-26 NOTE — Assessment & Plan Note (Signed)
con'tlyrica 

## 2013-09-26 NOTE — Addendum Note (Signed)
Addended by: Ewing Schlein on: 09/26/2013 11:49 AM   Modules accepted: Orders

## 2013-09-28 LAB — POCT URINALYSIS DIPSTICK
Bilirubin, UA: NEGATIVE
Glucose, UA: NEGATIVE
Ketones, UA: NEGATIVE
Leukocytes, UA: NEGATIVE
NITRITE UA: POSITIVE
PH UA: 7
RBC UA: NEGATIVE
SPEC GRAV UA: 1.02
UROBILINOGEN UA: 0.2

## 2013-09-28 NOTE — Addendum Note (Signed)
Addended by: Modena Morrow D on: 09/28/2013 04:52 PM   Modules accepted: Orders

## 2013-09-30 ENCOUNTER — Other Ambulatory Visit: Payer: Self-pay | Admitting: Family Medicine

## 2013-09-30 DIAGNOSIS — N390 Urinary tract infection, site not specified: Secondary | ICD-10-CM

## 2013-09-30 LAB — URINE CULTURE: Colony Count: >=100000

## 2013-09-30 MED ORDER — CIPROFLOXACIN HCL 500 MG PO TABS: 500.0000 mg | ORAL_TABLET | Freq: Two times a day (BID) | ORAL | Status: AC

## 2014-04-25 ENCOUNTER — Encounter: Payer: Self-pay | Admitting: Gastroenterology

## 2014-05-24 ENCOUNTER — Telehealth: Payer: Self-pay | Admitting: *Deleted

## 2014-05-24 NOTE — Telephone Encounter (Signed)
Received health provider screening form via fax from [place]. Form filled out and forwarded to Dr. Birdie Riddle (in Dr. Nonda Lou absence) for review/signature. JG//CMA

## 2014-06-16 ENCOUNTER — Telehealth: Payer: Self-pay | Admitting: Family Medicine

## 2014-06-16 NOTE — Telephone Encounter (Signed)
Caller name: [place], [place] Relation to pt: self  Call back number: [phone]   Reason for call:     [employer] advised pt she failed to sign paperwork and needs to start process all over pt refaxed forms a few days wanted to know if forms were received.

## 2014-06-16 NOTE — Telephone Encounter (Signed)
Forms received and forwarded to Dr. Etter Sjogren. JG//CMA

## 2014-06-20 NOTE — Telephone Encounter (Signed)
Jackie Casey [phone] (M)  [name] called to see what the status was on her paperwork, she is needing these to be faxed in for her insurance coverage for next year, and she was also asking if she had a colorectal cancer screening and she wants to make sure those results are on there as well. She also stated she had looked on Mychart and does not see any results for this.

## 2014-09-20 ENCOUNTER — Encounter: Payer: Self-pay | Admitting: Family Medicine

## 2014-09-21 ENCOUNTER — Encounter: Payer: Self-pay | Admitting: *Deleted

## 2014-09-21 ENCOUNTER — Telehealth: Payer: Self-pay | Admitting: *Deleted

## 2014-09-21 NOTE — Telephone Encounter (Signed)
Pre-Visit Call:   Reviewed allergies, medications, health history, and health maintenance with patient and made changes as appropriate.   Preferred pharmacy:  [id number] [place], [address] (patient to change pharmacy soon to [facility] but not sure which one, states she will call when she changes)  Pap-09/26/13 with Dr. Etter Sjogren- normal, would like pap tomorrow  [facility]- 12/24/09- with Verl Blalock, MD at [place], negative  [facility]- 08/29/14- with Christene Slates, MD at [facility], BI-RADS CATEGORY 2: Benign  Flu- has not had, would like at appointment  Td- 12/02/10 UTD  Concerns: no concerns, has biometrics form for work to be completed  Stated she will not be fasting since apt is at 3, but expects to make separate lab appt to check fasting labs

## 2014-09-22 ENCOUNTER — Encounter: Payer: Self-pay | Admitting: Family Medicine

## 2014-09-22 ENCOUNTER — Other Ambulatory Visit (HOSPITAL_COMMUNITY)
Admission: RE | Admit: 2014-09-22 | Discharge: 2014-09-22 | Disposition: A | Discharge status: Home / Self Care | Payer: 59 | Source: Ambulatory Visit | Attending: Family Medicine | Admitting: Family Medicine

## 2014-09-22 ENCOUNTER — Ambulatory Visit (INDEPENDENT_AMBULATORY_CARE_PROVIDER_SITE_OTHER): Payer: 59 | Admitting: Family Medicine

## 2014-09-22 VITALS — BP 112/74 | Temp 98.0°F | Ht 65.0 in | Wt 143.8 lb

## 2014-09-22 DIAGNOSIS — Z1151 Encounter for screening for human papillomavirus (HPV): Secondary | ICD-10-CM | POA: Diagnosis present

## 2014-09-22 DIAGNOSIS — Z124 Encounter for screening for malignant neoplasm of cervix: Secondary | ICD-10-CM

## 2014-09-22 DIAGNOSIS — Z01419 Encounter for gynecological examination (general) (routine) without abnormal findings: Secondary | ICD-10-CM | POA: Diagnosis present

## 2014-09-22 DIAGNOSIS — Z23 Encounter for immunization: Secondary | ICD-10-CM

## 2014-09-22 DIAGNOSIS — Z0000 Encounter for general adult medical examination without abnormal findings: Secondary | ICD-10-CM

## 2014-09-22 LAB — BASIC METABOLIC PANEL
BUN: 10 mg/dL (ref 6–23)
CO2: 28 meq/L (ref 19–32)
Calcium: 8.9 mg/dL (ref 8.4–10.5)
Chloride: 101 mEq/L (ref 96–112)
Creat: 0.78 mg/dL (ref 0.50–1.10)
Glucose, Bld: 81 mg/dL (ref 70–99)
Potassium: 3.5 mEq/L (ref 3.5–5.3)
SODIUM: 137 meq/L (ref 135–145)

## 2014-09-22 LAB — HEPATIC FUNCTION PANEL
ALBUMIN: 4.1 g/dL (ref 3.5–5.2)
ALK PHOS: 24 U/L — AB (ref 39–117)
ALT: 15 U/L (ref 0–35)
AST: 15 U/L (ref 0–37)
BILIRUBIN INDIRECT: 0.5 mg/dL (ref 0.2–1.2)
Bilirubin, Direct: 0.1 mg/dL (ref 0.0–0.3)
Total Bilirubin: 0.6 mg/dL (ref 0.2–1.2)
Total Protein: 7.5 g/dL (ref 6.0–8.3)

## 2014-09-22 LAB — LIPID PANEL
Cholesterol: 148 mg/dL (ref 0–200)
HDL: 56 mg/dL (ref >39)
LDL CALC: 80 mg/dL (ref 0–99)
Total CHOL/HDL Ratio: 2.6 Ratio
Triglycerides: 58 mg/dL (ref <150)
VLDL: 12 mg/dL (ref 0–40)

## 2014-09-22 LAB — TSH: TSH: 0.395 u[IU]/mL (ref 0.350–4.500)

## 2014-09-22 NOTE — Progress Notes (Signed)
Pre visit review using our clinic review tool, if applicable. No additional management support is needed unless otherwise documented below in the visit note. 

## 2014-09-22 NOTE — Progress Notes (Signed)
Subjective:     Jackie Casey is a 54 y.o. female and is here for a comprehensive physical exam. The patient reports no problems.  History   Social History  . Marital Status: Single    Spouse Name: N/A  . Number of Children: N/A  . Years of Education: N/A   Occupational History  . ADMINISTRATIVE Paramedic   Social History Main Topics  . Smoking status: Never Smoker   . Smokeless tobacco: Never Used  . Alcohol Use: 0.0 oz/week     Comment: rare--  1-2 x a month  . Drug Use: No  . Sexual Activity:    Partners: Male    Birth Control/ Protection: None   Other Topics Concern  . Not on file   Social History Narrative   Exercise-- crunches, jumping jacks etc   Health Maintenance  Topic Date Due  . INFLUENZA VACCINE  03/11/2014  . MAMMOGRAM  08/29/2016  . PAP SMEAR  09/26/2016  . COLONOSCOPY  12/25/2019  . TETANUS/TDAP  12/01/2020    The following portions of the patient's history were reviewed and updated as appropriate:  She  has a past medical history of Trigeminal neuralgia. She  does not have any pertinent problems on file. She  has past surgical history that includes Tonsillectomy. Her family history includes Asthma in her mother; Hypertension in her mother; Stroke in her mother. She  reports that she has never smoked. She has never used smokeless tobacco. She reports that she drinks alcohol. She reports that she does not use illicit drugs. She has a current medication list which includes the following prescription(s): aspirin, multivitamin, and pregabalin. Current Outpatient Prescriptions on File Prior to Visit  Medication Sig Dispense Refill  . aspirin 81 MG EC tablet Take 81 mg by mouth. Two times weekly    . Multiple Vitamin (MULTIVITAMIN) tablet Take 1 tablet by mouth daily.      . pregabalin (LYRICA) 100 MG capsule Take 1 capsule (100 mg total) by mouth 2 (two) times daily. 180 capsule 3   No current facility-administered  medications on file prior to visit.   She has No Known Allergies..  Review of Systems Review of Systems  Constitutional: Negative for activity change, appetite change and fatigue.  HENT: Negative for hearing loss, congestion, tinnitus and ear discharge.  dentist q51m Eyes: Negative for visual disturbance (see optho q1y -- vision corrected to 20/20 with glasses).  Respiratory: Negative for cough, chest tightness and shortness of breath.   Cardiovascular: Negative for chest pain, palpitations and leg swelling.  Gastrointestinal: Negative for abdominal pain, diarrhea, constipation and abdominal distention.  Genitourinary: Negative for urgency, frequency, decreased urine volume and difficulty urinating.  Musculoskeletal: Negative for back pain, arthralgias and gait problem.  Skin: Negative for color change, pallor and rash.  Neurological: Negative for dizziness, light-headedness, numbness and headaches.  Hematological: Negative for adenopathy. Does not bruise/bleed easily.  Psychiatric/Behavioral: Negative for suicidal ideas, confusion, sleep disturbance, self-injury, dysphoric mood, decreased concentration and agitation.       Objective:    BP 112/74 mmHg  Temp(Src) 98 F (36.7 C) (Oral)  Ht 5\' 5"  (1.651 m)  Wt 143 lb 12.8 oz (65.227 kg)  BMI 23.93 kg/m2  LMP 09/07/2014 General appearance: alert, cooperative, appears stated age and no distress Head: Normocephalic, without obvious abnormality, atraumatic Eyes: conjunctivae/corneas clear. PERRL, EOM's intact. Fundi benign. Ears: normal TM's and external ear canals both ears Nose: Nares normal. Septum midline. Mucosa normal.  No drainage or sinus tenderness. Throat: lips, mucosa, and tongue normal; teeth and gums normal Neck: no adenopathy, no carotid bruit, no JVD, supple, symmetrical, trachea midline and thyroid not enlarged, symmetric, no tenderness/mass/nodules Back: symmetric, no curvature. ROM normal. No CVA tenderness. Lungs:  clear to auscultation bilaterally Breasts: normal appearance, no masses or tenderness Heart: regular rate and rhythm, S1, S2 normal, no murmur, click, rub or gallop Abdomen: soft, non-tender; bowel sounds normal; no masses,  no organomegaly Pelvic: cervix normal in appearance, external genitalia normal, no adnexal masses or tenderness, no cervical motion tenderness, rectovaginal septum normal, uterus normal size, shape, and consistency, vagina normal without discharge and pap done, rectal heme neg brown stool Extremities: extremities normal, atraumatic, no cyanosis or edema Pulses: 2+ and symmetric Skin: Skin color, texture, turgor normal. No rashes or lesions Lymph nodes: Cervical, supraclavicular, and axillary nodes normal. Neurologic: Alert and oriented X 3, normal strength and tone. Normal symmetric reflexes. Normal coordination and gait Psych-- no depression, no anxiety      Assessment:    Healthy female exam.      Plan:    ghm utd Check labs See After Visit Summary for Counseling Recommendations    1. Need for prophylactic vaccination and inoculation against influenza   - Flu Vaccine QUAD 36+ mos PF IM (Fluarix Quad PF)  2. Preventative health care   - Basic metabolic panel - CBC with Differential/Platelet - Hepatic function panel - Lipid panel - Microalbumin / creatinine urine ratio - POCT urinalysis dipstick - TSH - Cytology - PAP  3. Cervical cancer screening   - Cytology - PAP

## 2014-09-22 NOTE — Patient Instructions (Signed)
Preventive Care for Adults A healthy lifestyle and preventive care can promote health and wellness. Preventive health guidelines for women include the following key practices.  A routine yearly physical is a good way to check with your health care provider about your health and preventive screening. It is a chance to share any concerns and updates on your health and to receive a thorough exam.  Visit your dentist for a routine exam and preventive care every 6 months. Brush your teeth twice a day and floss once a day. Good oral hygiene prevents tooth decay and gum disease.  The frequency of eye exams is based on your age, health, family medical history, use of contact lenses, and other factors. Follow your health care provider's recommendations for frequency of eye exams.  Eat a healthy diet. Foods like vegetables, fruits, whole grains, low-fat dairy products, and lean protein foods contain the nutrients you need without too many calories. Decrease your intake of foods high in solid fats, added sugars, and salt. Eat the right amount of calories for you.Get information about a proper diet from your health care provider, if necessary.  Regular physical exercise is one of the most important things you can do for your health. Most adults should get at least 150 minutes of moderate-intensity exercise (any activity that increases your heart rate and causes you to sweat) each week. In addition, most adults need muscle-strengthening exercises on 2 or more days a week.  Maintain a healthy weight. The body mass index (BMI) is a screening tool to identify possible weight problems. It provides an estimate of body fat based on height and weight. Your health care provider can find your BMI and can help you achieve or maintain a healthy weight.For adults 20 years and older:  A BMI below 18.5 is considered underweight.  A BMI of 18.5 to 24.9 is normal.  A BMI of 25 to 29.9 is considered overweight.  A BMI of  30 and above is considered obese.  Maintain normal blood lipids and cholesterol levels by exercising and minimizing your intake of saturated fat. Eat a balanced diet with plenty of fruit and vegetables. Blood tests for lipids and cholesterol should begin at age 76 and be repeated every 5 years. If your lipid or cholesterol levels are high, you are over 50, or you are at high risk for heart disease, you may need your cholesterol levels checked more frequently.Ongoing high lipid and cholesterol levels should be treated with medicines if diet and exercise are not working.  If you smoke, find out from your health care provider how to quit. If you do not use tobacco, do not start.  Lung cancer screening is recommended for adults aged 22-80 years who are at high risk for developing lung cancer because of a history of smoking. A yearly low-dose CT scan of the lungs is recommended for people who have at least a 30-pack-year history of smoking and are a current smoker or have quit within the past 15 years. A pack year of smoking is smoking an average of 1 pack of cigarettes a day for 1 year (for example: 1 pack a day for 30 years or 2 packs a day for 15 years). Yearly screening should continue until the smoker has stopped smoking for at least 15 years. Yearly screening should be stopped for people who develop a health problem that would prevent them from having lung cancer treatment.  If you are pregnant, do not drink alcohol. If you are breastfeeding,  be very cautious about drinking alcohol. If you are not pregnant and choose to drink alcohol, do not have more than 1 drink per day. One drink is considered to be 12 ounces (355 mL) of beer, 5 ounces (148 mL) of wine, or 1.5 ounces (44 mL) of liquor.  Avoid use of street drugs. Do not share needles with anyone. Ask for help if you need support or instructions about stopping the use of drugs.  High blood pressure causes heart disease and increases the risk of  stroke. Your blood pressure should be checked at least every 1 to 2 years. Ongoing high blood pressure should be treated with medicines if weight loss and exercise do not work.  If you are 75-52 years old, ask your health care provider if you should take aspirin to prevent strokes.  Diabetes screening involves taking a blood sample to check your fasting blood sugar level. This should be done once every 3 years, after age 15, if you are within normal weight and without risk factors for diabetes. Testing should be considered at a younger age or be carried out more frequently if you are overweight and have at least 1 risk factor for diabetes.  Breast cancer screening is essential preventive care for women. You should practice "breast self-awareness." This means understanding the normal appearance and feel of your breasts and may include breast self-examination. Any changes detected, no matter how small, should be reported to a health care provider. Women in their 58s and 30s should have a clinical breast exam (CBE) by a health care provider as part of a regular health exam every 1 to 3 years. After age 16, women should have a CBE every year. Starting at age 53, women should consider having a mammogram (breast X-ray test) every year. Women who have a family history of breast cancer should talk to their health care provider about genetic screening. Women at a high risk of breast cancer should talk to their health care providers about having an MRI and a mammogram every year.  Breast cancer gene (BRCA)-related cancer risk assessment is recommended for women who have family members with BRCA-related cancers. BRCA-related cancers include breast, ovarian, tubal, and peritoneal cancers. Having family members with these cancers may be associated with an increased risk for harmful changes (mutations) in the breast cancer genes BRCA1 and BRCA2. Results of the assessment will determine the need for genetic counseling and  BRCA1 and BRCA2 testing.  Routine pelvic exams to screen for cancer are no longer recommended for nonpregnant women who are considered low risk for cancer of the pelvic organs (ovaries, uterus, and vagina) and who do not have symptoms. Ask your health care provider if a screening pelvic exam is right for you.  If you have had past treatment for cervical cancer or a condition that could lead to cancer, you need Pap tests and screening for cancer for at least 20 years after your treatment. If Pap tests have been discontinued, your risk factors (such as having a new sexual partner) need to be reassessed to determine if screening should be resumed. Some women have medical problems that increase the chance of getting cervical cancer. In these cases, your health care provider may recommend more frequent screening and Pap tests.  The HPV test is an additional test that may be used for cervical cancer screening. The HPV test looks for the virus that can cause the cell changes on the cervix. The cells collected during the Pap test can be  tested for HPV. The HPV test could be used to screen women aged 30 years and older, and should be used in women of any age who have unclear Pap test results. After the age of 30, women should have HPV testing at the same frequency as a Pap test.  Colorectal cancer can be detected and often prevented. Most routine colorectal cancer screening begins at the age of 50 years and continues through age 75 years. However, your health care provider may recommend screening at an earlier age if you have risk factors for colon cancer. On a yearly basis, your health care provider may provide home test kits to check for hidden blood in the stool. Use of a small camera at the end of a tube, to directly examine the colon (sigmoidoscopy or colonoscopy), can detect the earliest forms of colorectal cancer. Talk to your health care provider about this at age 50, when routine screening begins. Direct  exam of the colon should be repeated every 5-10 years through age 75 years, unless early forms of pre-cancerous polyps or small growths are found.  People who are at an increased risk for hepatitis B should be screened for this virus. You are considered at high risk for hepatitis B if:  You were born in a country where hepatitis B occurs often. Talk with your health care provider about which countries are considered high risk.  Your parents were born in a high-risk country and you have not received a shot to protect against hepatitis B (hepatitis B vaccine).  You have HIV or AIDS.  You use needles to inject street drugs.  You live with, or have sex with, someone who has hepatitis B.  You get hemodialysis treatment.  You take certain medicines for conditions like cancer, organ transplantation, and autoimmune conditions.  Hepatitis C blood testing is recommended for all people born from 1945 through 1965 and any individual with known risks for hepatitis C.  Practice safe sex. Use condoms and avoid high-risk sexual practices to reduce the spread of sexually transmitted infections (STIs). STIs include gonorrhea, chlamydia, syphilis, trichomonas, herpes, HPV, and human immunodeficiency virus (HIV). Herpes, HIV, and HPV are viral illnesses that have no cure. They can result in disability, cancer, and death.  You should be screened for sexually transmitted illnesses (STIs) including gonorrhea and chlamydia if:  You are sexually active and are younger than 24 years.  You are older than 24 years and your health care provider tells you that you are at risk for this type of infection.  Your sexual activity has changed since you were last screened and you are at an increased risk for chlamydia or gonorrhea. Ask your health care provider if you are at risk.  If you are at risk of being infected with HIV, it is recommended that you take a prescription medicine daily to prevent HIV infection. This is  called preexposure prophylaxis (PrEP). You are considered at risk if:  You are a heterosexual woman, are sexually active, and are at increased risk for HIV infection.  You take drugs by injection.  You are sexually active with a partner who has HIV.  Talk with your health care provider about whether you are at high risk of being infected with HIV. If you choose to begin PrEP, you should first be tested for HIV. You should then be tested every 3 months for as long as you are taking PrEP.  Osteoporosis is a disease in which the bones lose minerals and strength   with aging. This can result in serious bone fractures or breaks. The risk of osteoporosis can be identified using a bone density scan. Women ages 65 years and over and women at risk for fractures or osteoporosis should discuss screening with their health care providers. Ask your health care provider whether you should take a calcium supplement or vitamin D to reduce the rate of osteoporosis.  Menopause can be associated with physical symptoms and risks. Hormone replacement therapy is available to decrease symptoms and risks. You should talk to your health care provider about whether hormone replacement therapy is right for you.  Use sunscreen. Apply sunscreen liberally and repeatedly throughout the day. You should seek shade when your shadow is shorter than you. Protect yourself by wearing long sleeves, pants, a wide-brimmed hat, and sunglasses year round, whenever you are outdoors.  Once a month, do a whole body skin exam, using a mirror to look at the skin on your back. Tell your health care provider of new moles, moles that have irregular borders, moles that are larger than a pencil eraser, or moles that have changed in shape or color.  Stay current with required vaccines (immunizations).  Influenza vaccine. All adults should be immunized every year.  Tetanus, diphtheria, and acellular pertussis (Td, Tdap) vaccine. Pregnant women should  receive 1 dose of Tdap vaccine during each pregnancy. The dose should be obtained regardless of the length of time since the last dose. Immunization is preferred during the 27th-36th week of gestation. An adult who has not previously received Tdap or who does not know her vaccine status should receive 1 dose of Tdap. This initial dose should be followed by tetanus and diphtheria toxoids (Td) booster doses every 10 years. Adults with an unknown or incomplete history of completing a 3-dose immunization series with Td-containing vaccines should begin or complete a primary immunization series including a Tdap dose. Adults should receive a Td booster every 10 years.  Varicella vaccine. An adult without evidence of immunity to varicella should receive 2 doses or a second dose if she has previously received 1 dose. Pregnant females who do not have evidence of immunity should receive the first dose after pregnancy. This first dose should be obtained before leaving the health care facility. The second dose should be obtained 4-8 weeks after the first dose.  Human papillomavirus (HPV) vaccine. Females aged 13-26 years who have not received the vaccine previously should obtain the 3-dose series. The vaccine is not recommended for use in pregnant females. However, pregnancy testing is not needed before receiving a dose. If a female is found to be pregnant after receiving a dose, no treatment is needed. In that case, the remaining doses should be delayed until after the pregnancy. Immunization is recommended for any person with an immunocompromised condition through the age of 26 years if she did not get any or all doses earlier. During the 3-dose series, the second dose should be obtained 4-8 weeks after the first dose. The third dose should be obtained 24 weeks after the first dose and 16 weeks after the second dose.  Zoster vaccine. One dose is recommended for adults aged 60 years or older unless certain conditions are  present.  Measles, mumps, and rubella (MMR) vaccine. Adults born before 1957 generally are considered immune to measles and mumps. Adults born in 1957 or later should have 1 or more doses of MMR vaccine unless there is a contraindication to the vaccine or there is laboratory evidence of immunity to   each of the three diseases. A routine second dose of MMR vaccine should be obtained at least 28 days after the first dose for students attending postsecondary schools, health care workers, or international travelers. People who received inactivated measles vaccine or an unknown type of measles vaccine during 1963-1967 should receive 2 doses of MMR vaccine. People who received inactivated mumps vaccine or an unknown type of mumps vaccine before 1979 and are at high risk for mumps infection should consider immunization with 2 doses of MMR vaccine. For females of childbearing age, rubella immunity should be determined. If there is no evidence of immunity, females who are not pregnant should be vaccinated. If there is no evidence of immunity, females who are pregnant should delay immunization until after pregnancy. Unvaccinated health care workers born before 1957 who lack laboratory evidence of measles, mumps, or rubella immunity or laboratory confirmation of disease should consider measles and mumps immunization with 2 doses of MMR vaccine or rubella immunization with 1 dose of MMR vaccine.  Pneumococcal 13-valent conjugate (PCV13) vaccine. When indicated, a person who is uncertain of her immunization history and has no record of immunization should receive the PCV13 vaccine. An adult aged 19 years or older who has certain medical conditions and has not been previously immunized should receive 1 dose of PCV13 vaccine. This PCV13 should be followed with a dose of pneumococcal polysaccharide (PPSV23) vaccine. The PPSV23 vaccine dose should be obtained at least 8 weeks after the dose of PCV13 vaccine. An adult aged 19  years or older who has certain medical conditions and previously received 1 or more doses of PPSV23 vaccine should receive 1 dose of PCV13. The PCV13 vaccine dose should be obtained 1 or more years after the last PPSV23 vaccine dose.  Pneumococcal polysaccharide (PPSV23) vaccine. When PCV13 is also indicated, PCV13 should be obtained first. All adults aged 65 years and older should be immunized. An adult younger than age 65 years who has certain medical conditions should be immunized. Any person who resides in a nursing home or long-term care facility should be immunized. An adult smoker should be immunized. People with an immunocompromised condition and certain other conditions should receive both PCV13 and PPSV23 vaccines. People with human immunodeficiency virus (HIV) infection should be immunized as soon as possible after diagnosis. Immunization during chemotherapy or radiation therapy should be avoided. Routine use of PPSV23 vaccine is not recommended for American Indians, [place] Natives, or people younger than 65 years unless there are medical conditions that require PPSV23 vaccine. When indicated, people who have unknown immunization and have no record of immunization should receive PPSV23 vaccine. One-time revaccination 5 years after the first dose of PPSV23 is recommended for people aged 19-64 years who have chronic kidney failure, nephrotic syndrome, asplenia, or immunocompromised conditions. People who received 1-2 doses of PPSV23 before age 65 years should receive another dose of PPSV23 vaccine at age 65 years or later if at least 5 years have passed since the previous dose. Doses of PPSV23 are not needed for people immunized with PPSV23 at or after age 65 years.  Meningococcal vaccine. Adults with asplenia or persistent complement component deficiencies should receive 2 doses of quadrivalent meningococcal conjugate (MenACWY-D) vaccine. The doses should be obtained at least 2 months apart.  Microbiologists working with certain meningococcal bacteria, military recruits, people at risk during an outbreak, and people who travel to or live in countries with a high rate of meningitis should be immunized. A first-year college student up through age   21 years who is living in a residence hall should receive a dose if she did not receive a dose on or after her 16th birthday. Adults who have certain high-risk conditions should receive one or more doses of vaccine.  Hepatitis A vaccine. Adults who wish to be protected from this disease, have certain high-risk conditions, work with hepatitis A-infected animals, work in hepatitis A research labs, or travel to or work in countries with a high rate of hepatitis A should be immunized. Adults who were previously unvaccinated and who anticipate close contact with an international adoptee during the first 60 days after arrival in the [place] from a country with a high rate of hepatitis A should be immunized.  Hepatitis B vaccine. Adults who wish to be protected from this disease, have certain high-risk conditions, may be exposed to blood or other infectious body fluids, are household contacts or sex partners of hepatitis B positive people, are clients or workers in certain care facilities, or travel to or work in countries with a high rate of hepatitis B should be immunized.  Haemophilus influenzae type b (Hib) vaccine. A previously unvaccinated person with asplenia or sickle cell disease or having a scheduled splenectomy should receive 1 dose of Hib vaccine. Regardless of previous immunization, a recipient of a hematopoietic stem cell transplant should receive a 3-dose series 6-12 months after her successful transplant. Hib vaccine is not recommended for adults with HIV infection. Preventive Services / Frequency Ages 64 to 68 years  Blood pressure check.** / Every 1 to 2 years.  Lipid and cholesterol check.** / Every 5 years beginning at age  22.  Clinical breast exam.** / Every 3 years for women in their 88s and 53s.  BRCA-related cancer risk assessment.** / For women who have family members with a BRCA-related cancer (breast, ovarian, tubal, or peritoneal cancers).  Pap test.** / Every 2 years from ages 90 through 51. Every 3 years starting at age 21 through age 56 or 3 with a history of 3 consecutive normal Pap tests.  HPV screening.** / Every 3 years from ages 24 through ages 1 to 46 with a history of 3 consecutive normal Pap tests.  Hepatitis C blood test.** / For any individual with known risks for hepatitis C.  Skin self-exam. / Monthly.  Influenza vaccine. / Every year.  Tetanus, diphtheria, and acellular pertussis (Tdap, Td) vaccine.** / Consult your health care provider. Pregnant women should receive 1 dose of Tdap vaccine during each pregnancy. 1 dose of Td every 10 years.  Varicella vaccine.** / Consult your health care provider. Pregnant females who do not have evidence of immunity should receive the first dose after pregnancy.  HPV vaccine. / 3 doses over 6 months, if 72 and younger. The vaccine is not recommended for use in pregnant females. However, pregnancy testing is not needed before receiving a dose.  Measles, mumps, rubella (MMR) vaccine.** / You need at least 1 dose of MMR if you were born in 1957 or later. You may also need a 2nd dose. For females of childbearing age, rubella immunity should be determined. If there is no evidence of immunity, females who are not pregnant should be vaccinated. If there is no evidence of immunity, females who are pregnant should delay immunization until after pregnancy.  Pneumococcal 13-valent conjugate (PCV13) vaccine.** / Consult your health care provider.  Pneumococcal polysaccharide (PPSV23) vaccine.** / 1 to 2 doses if you smoke cigarettes or if you have certain conditions.  Meningococcal vaccine.** /  1 dose if you are age 19 to 21 years and a first-year college  student living in a residence hall, or have one of several medical conditions, you need to get vaccinated against meningococcal disease. You may also need additional booster doses.  Hepatitis A vaccine.** / Consult your health care provider.  Hepatitis B vaccine.** / Consult your health care provider.  Haemophilus influenzae type b (Hib) vaccine.** / Consult your health care provider. Ages 40 to 64 years  Blood pressure check.** / Every 1 to 2 years.  Lipid and cholesterol check.** / Every 5 years beginning at age 20 years.  Lung cancer screening. / Every year if you are aged 55-80 years and have a 30-pack-year history of smoking and currently smoke or have quit within the past 15 years. Yearly screening is stopped once you have quit smoking for at least 15 years or develop a health problem that would prevent you from having lung cancer treatment.  Clinical breast exam.** / Every year after age 40 years.  BRCA-related cancer risk assessment.** / For women who have family members with a BRCA-related cancer (breast, ovarian, tubal, or peritoneal cancers).  Mammogram.** / Every year beginning at age 40 years and continuing for as long as you are in good health. Consult with your health care provider.  Pap test.** / Every 3 years starting at age 30 years through age 65 or 70 years with a history of 3 consecutive normal Pap tests.  HPV screening.** / Every 3 years from ages 30 years through ages 65 to 70 years with a history of 3 consecutive normal Pap tests.  Fecal occult blood test (FOBT) of stool. / Every year beginning at age 50 years and continuing until age 75 years. You may not need to do this test if you get a colonoscopy every 10 years.  Flexible sigmoidoscopy or colonoscopy.** / Every 5 years for a flexible sigmoidoscopy or every 10 years for a colonoscopy beginning at age 50 years and continuing until age 75 years.  Hepatitis C blood test.** / For all people born from 1945 through  1965 and any individual with known risks for hepatitis C.  Skin self-exam. / Monthly.  Influenza vaccine. / Every year.  Tetanus, diphtheria, and acellular pertussis (Tdap/Td) vaccine.** / Consult your health care provider. Pregnant women should receive 1 dose of Tdap vaccine during each pregnancy. 1 dose of Td every 10 years.  Varicella vaccine.** / Consult your health care provider. Pregnant females who do not have evidence of immunity should receive the first dose after pregnancy.  Zoster vaccine.** / 1 dose for adults aged 60 years or older.  Measles, mumps, rubella (MMR) vaccine.** / You need at least 1 dose of MMR if you were born in 1957 or later. You may also need a 2nd dose. For females of childbearing age, rubella immunity should be determined. If there is no evidence of immunity, females who are not pregnant should be vaccinated. If there is no evidence of immunity, females who are pregnant should delay immunization until after pregnancy.  Pneumococcal 13-valent conjugate (PCV13) vaccine.** / Consult your health care provider.  Pneumococcal polysaccharide (PPSV23) vaccine.** / 1 to 2 doses if you smoke cigarettes or if you have certain conditions.  Meningococcal vaccine.** / Consult your health care provider.  Hepatitis A vaccine.** / Consult your health care provider.  Hepatitis B vaccine.** / Consult your health care provider.  Haemophilus influenzae type b (Hib) vaccine.** / Consult your health care provider. Ages 65   years and over  Blood pressure check.** / Every 1 to 2 years.  Lipid and cholesterol check.** / Every 5 years beginning at age 22 years.  Lung cancer screening. / Every year if you are aged 73-80 years and have a 30-pack-year history of smoking and currently smoke or have quit within the past 15 years. Yearly screening is stopped once you have quit smoking for at least 15 years or develop a health problem that would prevent you from having lung cancer  treatment.  Clinical breast exam.** / Every year after age 4 years.  BRCA-related cancer risk assessment.** / For women who have family members with a BRCA-related cancer (breast, ovarian, tubal, or peritoneal cancers).  Mammogram.** / Every year beginning at age 40 years and continuing for as long as you are in good health. Consult with your health care provider.  Pap test.** / Every 3 years starting at age 9 years through age 34 or 91 years with 3 consecutive normal Pap tests. Testing can be stopped between 65 and 70 years with 3 consecutive normal Pap tests and no abnormal Pap or HPV tests in the past 10 years.  HPV screening.** / Every 3 years from ages 57 years through ages 64 or 45 years with a history of 3 consecutive normal Pap tests. Testing can be stopped between 65 and 70 years with 3 consecutive normal Pap tests and no abnormal Pap or HPV tests in the past 10 years.  Fecal occult blood test (FOBT) of stool. / Every year beginning at age 15 years and continuing until age 17 years. You may not need to do this test if you get a colonoscopy every 10 years.  Flexible sigmoidoscopy or colonoscopy.** / Every 5 years for a flexible sigmoidoscopy or every 10 years for a colonoscopy beginning at age 86 years and continuing until age 71 years.  Hepatitis C blood test.** / For all people born from [birth year] through 1966 and any individual with known risks for hepatitis C.  Osteoporosis screening.** / A one-time screening for women ages 83 years and over and women at risk for fractures or osteoporosis.  Skin self-exam. / Monthly.  Influenza vaccine. / Every year.  Tetanus, diphtheria, and acellular pertussis (Tdap/Td) vaccine.** / 1 dose of Td every 10 years.  Varicella vaccine.** / Consult your health care provider.  Zoster vaccine.** / 1 dose for adults aged 61 years or older.  Pneumococcal 13-valent conjugate (PCV13) vaccine.** / Consult your health care provider.  Pneumococcal  polysaccharide (PPSV23) vaccine.** / 1 dose for all adults aged 28 years and older.  Meningococcal vaccine.** / Consult your health care provider.  Hepatitis A vaccine.** / Consult your health care provider.  Hepatitis B vaccine.** / Consult your health care provider.  Haemophilus influenzae type b (Hib) vaccine.** / Consult your health care provider. ** Family history and personal history of risk and conditions may change your health care provider's recommendations. Document Released: 09/23/2001 Document Revised: 12/12/2013 Document Reviewed: 12/23/2010 [facility] Patient Information 2015 [place], [place]. This information is not intended to replace advice given to you by your health care provider. Make sure you discuss any questions you have with your health care provider.

## 2014-09-22 NOTE — Addendum Note (Signed)
Addended by: Peggyann Shoals on: 09/22/2014 04:02 PM   Modules accepted: Orders

## 2014-09-23 LAB — CBC WITH DIFFERENTIAL/PLATELET
BASOS PCT: 1 % (ref 0–1)
Basophils Absolute: 0 10*3/uL (ref 0.0–0.1)
Eosinophils Absolute: 0.1 10*3/uL (ref 0.0–0.7)
Eosinophils Relative: 4 % (ref 0–5)
HEMATOCRIT: 33.5 % — AB (ref 36.0–46.0)
Hemoglobin: 10.9 g/dL — ABNORMAL LOW (ref 12.0–15.0)
LYMPHS ABS: 0.7 10*3/uL (ref 0.7–4.0)
Lymphocytes Relative: 44 % (ref 12–46)
MCH: 27.9 pg (ref 26.0–34.0)
MCHC: 32.5 g/dL (ref 30.0–36.0)
MCV: 85.7 fL (ref 78.0–100.0)
Monocytes Absolute: 0.2 10*3/uL (ref 0.1–1.0)
Monocytes Relative: 14 % — ABNORMAL HIGH (ref 3–12)
NEUTROS PCT: 37 % — AB (ref 43–77)
Neutro Abs: 0.6 10*3/uL — ABNORMAL LOW (ref 1.7–7.7)
Platelets: 112 10*3/uL — ABNORMAL LOW (ref 150–400)
RBC: 3.91 MIL/uL (ref 3.87–5.11)
RDW: 13.7 % (ref 11.5–15.5)
WBC: 1.7 10*3/uL — AB (ref 4.0–10.5)

## 2014-09-23 LAB — MICROALBUMIN / CREATININE URINE RATIO
Creatinine, Urine: 34.5 mg/dL
Microalb, Ur: <0.2 mg/dL (ref <2.0)

## 2014-09-25 ENCOUNTER — Other Ambulatory Visit: Payer: Self-pay | Admitting: Family Medicine

## 2014-09-25 DIAGNOSIS — D72819 Decreased white blood cell count, unspecified: Secondary | ICD-10-CM

## 2014-09-25 LAB — PATHOLOGIST SMEAR REVIEW

## 2014-09-26 LAB — CYTOLOGY - PAP

## 2014-10-09 ENCOUNTER — Telehealth: Payer: Self-pay | Admitting: *Deleted

## 2014-10-09 NOTE — Telephone Encounter (Signed)
Received patient assistance application for [employer] via mail from patient for [employer]. Forms filled out and forwarded to Dr. Etter Sjogren. JG//CMA

## 2014-10-13 ENCOUNTER — Other Ambulatory Visit: Payer: Self-pay | Admitting: *Deleted

## 2014-10-13 DIAGNOSIS — G500 Trigeminal neuralgia: Secondary | ICD-10-CM

## 2014-10-16 ENCOUNTER — Encounter: Payer: Self-pay | Admitting: Hematology & Oncology

## 2014-10-16 ENCOUNTER — Other Ambulatory Visit (HOSPITAL_BASED_OUTPATIENT_CLINIC_OR_DEPARTMENT_OTHER): Payer: 59 | Admitting: Lab

## 2014-10-16 ENCOUNTER — Ambulatory Visit (HOSPITAL_BASED_OUTPATIENT_CLINIC_OR_DEPARTMENT_OTHER): Payer: 59 | Admitting: Hematology & Oncology

## 2014-10-16 VITALS — BP 149/64 | HR 55 | Temp 97.9°F | Resp 14 | Ht 65.0 in | Wt 142.0 lb

## 2014-10-16 DIAGNOSIS — D72819 Decreased white blood cell count, unspecified: Secondary | ICD-10-CM

## 2014-10-16 DIAGNOSIS — D696 Thrombocytopenia, unspecified: Secondary | ICD-10-CM

## 2014-10-16 DIAGNOSIS — G500 Trigeminal neuralgia: Secondary | ICD-10-CM

## 2014-10-16 LAB — CBC WITH DIFFERENTIAL (CANCER CENTER ONLY)
BASO#: 0 10*3/uL (ref 0.0–0.2)
BASO%: 0 % (ref 0.0–2.0)
EOS%: 0.6 % (ref 0.0–7.0)
Eosinophils Absolute: 0 10*3/uL (ref 0.0–0.5)
HEMATOCRIT: 32.8 % — AB (ref 34.8–46.6)
HGB: 10.7 g/dL — ABNORMAL LOW (ref 11.6–15.9)
LYMPH#: 0.6 10*3/uL — ABNORMAL LOW (ref 0.9–3.3)
LYMPH%: 36.4 % (ref 14.0–48.0)
MCH: 28.3 pg (ref 26.0–34.0)
MCHC: 32.6 g/dL (ref 32.0–36.0)
MCV: 87 fL (ref 81–101)
MONO#: 0.3 10*3/uL (ref 0.1–0.9)
MONO%: 17.3 % — ABNORMAL HIGH (ref 0.0–13.0)
NEUT#: 0.7 10*3/uL — ABNORMAL LOW (ref 1.5–6.5)
NEUT%: 45.7 % (ref 39.6–80.0)
Platelets: 105 10*3/uL — ABNORMAL LOW (ref 145–400)
RBC: 3.78 10*6/uL (ref 3.70–5.32)
RDW: 12.8 % (ref 11.1–15.7)
WBC: 1.6 10*3/uL — ABNORMAL LOW (ref 3.9–10.0)

## 2014-10-16 LAB — CHCC SATELLITE - SMEAR

## 2014-10-16 NOTE — Progress Notes (Signed)
Hematology and Oncology Follow Up Visit  Jackie Casey [id number] [birth year] 54 y.o. 10/16/2014  Principle Diagnosis:  1. Leukopenia and thrombocytopenia. 2. Positive antinuclear antibodies.   Current Therapy:   Observation    Interim History:  Jackie Casey is here today for a follow-up. She is doing quite well and has had no problems with infections. Her WBC are 1.6 and platelets are 105. She has had no episodes of bleeding.  She denies fatigue, fever, chills, n/v, cough, rash, bruising, dizziness, headache, blurred vision, SOB, chest pain, palpitations, abdominal pain, constipation, diarrhea, blood in urine or stool. No lymphadenopathy.  No swelling, tenderness, numbness or tingling in her extremities.  Her appetite is good and she is staying hydrated.  She still has her cycles but the are slowing down and becoming more irregular.  Her mammogram was in January and was negative.    Medications:    Medication List       This list is accurate as of: 10/16/14  4:01 PM.  Always use your most recent med list.               aspirin 81 MG EC tablet  Take 81 mg by mouth. Two times weekly     multivitamin tablet  Take 1 tablet by mouth daily.     pregabalin 100 MG capsule  Commonly known as:  LYRICA  Take 1 capsule (100 mg total) by mouth 2 (two) times daily.        Allergies: No Known Allergies  Past Medical History, Surgical history, Social history, and Family History were reviewed and updated.  Review of Systems: All other 10 point review of systems is negative.   Physical Exam:  height is 5\' 5"  (1.651 m) and weight is 142 lb (64.411 kg). Her oral temperature is 97.9 F (36.6 C). Her blood pressure is 149/64 and her pulse is 55. Her respiration is 14.   Wt Readings from Last 3 Encounters:  10/16/14 142 lb (64.411 kg)  09/22/14 143 lb 12.8 oz (65.227 kg)  09/26/13 142 lb 6.4 oz (64.592 kg)    Ocular: Sclerae unicteric, pupils equal, round and reactive to  light Ear-nose-throat: Oropharynx clear, dentition fair Lymphatic: No cervical or supraclavicular adenopathy Lungs no rales or rhonchi, good excursion bilaterally Heart regular rate and rhythm, no murmur appreciated Abd soft, nontender, positive bowel sounds MSK no focal spinal tenderness, no joint edema Neuro: non-focal, well-oriented, appropriate affect Breasts: Deferred  Lab Results  Component Value Date   WBC 1.6* 10/16/2014   HGB 10.7* 10/16/2014   HCT 32.8* 10/16/2014   MCV 87 10/16/2014   PLT 105* 10/16/2014   Lab Results  Component Value Date   FERRITIN 22.3 01/09/2010   IRON 80 01/09/2010   TIBC 290 03/15/2008   UIBC 227 03/15/2008   IRONPCTSAT 26.9 01/09/2010   Lab Results  Component Value Date   RETICCTPCT 2.4* 03/15/2008   RBC 3.78 10/16/2014   No results found for: Nils Pyle [facility] Lab Results  Component Value Date   IGGSERUM 1610 03/15/2008   IGA 230 03/15/2008   IGMSERUM 174 03/15/2008   Lab Results  Component Value Date   TOTALPROTELP 7.3 03/15/2008   ALBUMINELP 57.4 03/15/2008   A1GS 2.9 03/15/2008   A2GS 8.4 03/15/2008   BETS 5.1 03/15/2008   BETA2SER 3.9 03/15/2008   GAMS 22.3* 03/15/2008   MSPIKE NOT DET 03/15/2008   SPEI * 03/15/2008     Chemistry      Component Value Date/Time  NA 137 09/22/2014 1603   K 3.5 09/22/2014 1603   CL 101 09/22/2014 1603   CO2 28 09/22/2014 1603   BUN 10 09/22/2014 1603   CREATININE 0.78 09/22/2014 1603   CREATININE 0.8 09/26/2013 1026      Component Value Date/Time   CALCIUM 8.9 09/22/2014 1603   ALKPHOS 24* 09/22/2014 1603   AST 15 09/22/2014 1603   ALT 15 09/22/2014 1603   BILITOT 0.6 09/22/2014 1603     Impression and Plan: Jackie Casey is a very nice 54 year old African American female with leukopenia and thrombocytopenia. Her WBC today are 1.6 and platelets 105. She is completely asymptomatic at this time. She has had no problems with infection and no episodes of bleeding.   We will see her back in 3 months for labs and follow-up.  She knows to call here with any questions or concerns and to go to the ED in the event of an emergency. We can certainly see her sooner if need be.   Eliezer Bottom, NP 3/7/20164:01 PM

## 2014-10-25 ENCOUNTER — Telehealth: Payer: Self-pay | Admitting: Family Medicine

## 2014-10-25 NOTE — Telephone Encounter (Signed)
Caller name: Breuna Relation to pt: self Call back number: [phone] Pharmacy:  Reason for call:   Patient states that patient assistance program did not receive her paperwork. Please resend. Patient does not know fax#

## 2014-10-25 NOTE — Telephone Encounter (Signed)
Patient wants to know how long this will take? She states that she needs her medication that goes through patient assistance.

## 2014-10-25 NOTE — Telephone Encounter (Signed)
Forms have not been scanned in to chart yet. Once they are, we can re-fax.

## 2014-10-26 NOTE — Telephone Encounter (Signed)
Hopefully it will be soon, I will keep a look out for it to be scanned and fax it as soon as it does. JG//CMA

## 2014-10-31 NOTE — Telephone Encounter (Signed)
Spoke with patient and she was upset, she said she is about to be out of medication and she is in a lot of pain. I made her aware that I will print and refax the information today but I will need the income information, she said she will fax it to me this evening. I advised as soon as I receive it I will call her and get it taken care of, she verbalized understanding and thanks me for my help.

## 2014-10-31 NOTE — Telephone Encounter (Signed)
Patient requesting a callback regarding this. She is out of this medication. Requesting to talk to [facility]. [facility] # [phone]

## 2014-11-01 NOTE — Telephone Encounter (Signed)
Patient aware paperwork received and has been faxed to [employer].      KP

## 2014-11-07 MED ORDER — PREGABALIN 100 MG PO CAPS: 100.0000 mg | ORAL_CAPSULE | Freq: Two times a day (BID) | ORAL | Status: DC

## 2014-11-07 NOTE — Addendum Note (Signed)
Addended by: Ewing Schlein on: 11/07/2014 03:26 PM   Modules accepted: Orders

## 2014-11-07 NOTE — Telephone Encounter (Signed)
Rx faxed.    KP 

## 2014-11-07 NOTE — Telephone Encounter (Signed)
Patient states that [employer] needs the original rx as well.

## 2014-11-20 NOTE — Telephone Encounter (Signed)
Spoke with patient and she said she was told she wouldn't know if the information for 2 weeks because they have a back log.  She said she will call them tomorrow because tomorrow it will be 2 weeks.       KP

## 2014-11-20 NOTE — Telephone Encounter (Signed)
Patient states that [facility] did not receive this. She wants to make sure everything has been faxed??? [facility] # [phone]

## 2014-11-27 NOTE — Telephone Encounter (Signed)
Patient still has not received the medication from [employer]. I called the company on a conference call and spoke with a supervisor who advises they have the application but have not received the Rx. I made them aware we have been faxing since the 29th of March, Freda Munro gave me her Fax number and asked to fax everything back to her and they will get a rush process on it, Advised me to try back in 24 hours to check the status. The patient was available during the conversation and has agreed. I faxed the paperwork again but to [phone] per sheila's request.     KP

## 2015-01-17 ENCOUNTER — Telehealth: Payer: Self-pay | Admitting: Hematology & Oncology

## 2015-01-17 NOTE — Telephone Encounter (Signed)
Patient called and cx 01/18/15 apt.  She will call back to resch

## 2015-01-18 ENCOUNTER — Ambulatory Visit: Payer: 59 | Admitting: Family

## 2015-01-18 ENCOUNTER — Other Ambulatory Visit: Payer: 59

## 2015-04-05 ENCOUNTER — Ambulatory Visit (INDEPENDENT_AMBULATORY_CARE_PROVIDER_SITE_OTHER): Payer: 59 | Admitting: Family Medicine

## 2015-04-05 ENCOUNTER — Encounter: Payer: Self-pay | Admitting: Family Medicine

## 2015-04-05 VITALS — BP 120/84 | HR 61 | Temp 98.1°F | Resp 16 | Ht 65.0 in | Wt 140.0 lb

## 2015-04-05 DIAGNOSIS — R21 Rash and other nonspecific skin eruption: Secondary | ICD-10-CM | POA: Diagnosis not present

## 2015-04-05 DIAGNOSIS — L910 Hypertrophic scar: Secondary | ICD-10-CM | POA: Diagnosis not present

## 2015-04-05 NOTE — Patient Instructions (Signed)

## 2015-04-05 NOTE — Progress Notes (Signed)
Patient ID: Jackie Casey, female   DOB: [birth year], 54 y.o.   MRN: [id number]   Subjective:    Patient ID: Jackie Casey, female    DOB: [birth year], 54 y.o.   MRN: [id number]  Chief Complaint  Patient presents with  . Rash    Pt reports rash on face  for several months. Has been using various cream for dark spots on face and thinks one with alpha hydroxy started it.  . Keloid    Pt would like referral to dermatologist for this.     HPI Patient is in today with c/o keloid on chest that has been there for years --- when she was a baby in [place] it was part of their culture to cut them and keloid formed when she got older.   She is also c/o rash on face with dark spots and she has been using otc fade creams and vita c lotion to fade them--- it is not working    She is requesting a derm referral.     Past Medical History  Diagnosis Date  . Trigeminal neuralgia     Past Surgical History  Procedure Laterality Date  . Tonsillectomy      Family History  Problem Relation Age of Onset  . Stroke Mother     stroke  . Asthma Mother   . Hypertension Mother     Social History   Social History  . Marital Status: Single    Spouse Name: N/A  . Number of Children: N/A  . Years of Education: N/A   Occupational History  . ADMINISTRATIVE Paramedic   Social History Main Topics  . Smoking status: Never Smoker   . Smokeless tobacco: Never Used     Comment: NEVER USED TOBACCO  . Alcohol Use: 0.0 oz/week    0 Standard drinks or equivalent per week     Comment: rare--  1-2 x a month  . Drug Use: No  . Sexual Activity:    Partners: Male    Birth Control/ Protection: None   Other Topics Concern  . Not on file   Social History Narrative   Exercise-- crunches, jumping jacks etc    Outpatient Prescriptions Prior to Visit  Medication Sig Dispense Refill  . aspirin 81 MG EC tablet Take 81 mg by mouth. Two times weekly    . Multiple Vitamin (MULTIVITAMIN)  tablet Take 1 tablet by mouth daily.      . pregabalin (LYRICA) 100 MG capsule Take 1 capsule (100 mg total) by mouth 2 (two) times daily. 180 capsule 3   No facility-administered medications prior to visit.    No Known Allergies  Review of Systems  Constitutional: Negative for fever, chills and malaise/fatigue.  HENT: Negative for congestion and hearing loss.   Eyes: Negative for discharge.  Respiratory: Negative for cough, sputum production and shortness of breath.   Cardiovascular: Negative for chest pain, palpitations and leg swelling.  Gastrointestinal: Negative for heartburn, nausea, vomiting, abdominal pain, diarrhea, constipation and blood in stool.  Musculoskeletal: Negative for myalgias, back pain and falls.  Skin: Positive for rash.  Neurological: Negative for dizziness, sensory change, loss of consciousness, weakness and headaches.  Endo/Heme/Allergies: Negative for environmental allergies. Does not bruise/bleed easily.  Psychiatric/Behavioral: Negative for depression and suicidal ideas. The patient is not nervous/anxious and does not have insomnia.        Objective:    Physical Exam  Constitutional: She appears well-developed and well-nourished. No  distress.  Skin: Rash noted. Rash is papular.     Psychiatric: She has a normal mood and affect. Her behavior is normal. Judgment and thought content normal.    BP 120/84 mmHg  Pulse 61  Temp(Src) 98.1 F (36.7 C) (Oral)  Resp 16  Ht 5\' 5"  (1.651 m)  Wt 140 lb (63.504 kg)  BMI 23.30 kg/m2  SpO2 100%  LMP 02/23/2015 Wt Readings from Last 3 Encounters:  04/05/15 140 lb (63.504 kg)  10/16/14 142 lb (64.411 kg)  09/22/14 143 lb 12.8 oz (65.227 kg)     Lab Results  Component Value Date   WBC 1.6* 10/16/2014   HGB 10.7* 10/16/2014   HCT 32.8* 10/16/2014   PLT 105* 10/16/2014   GLUCOSE 81 09/22/2014   CHOL 148 09/22/2014   TRIG 58 09/22/2014   HDL 56 09/22/2014   LDLCALC 80 09/22/2014   ALT 15 09/22/2014     AST 15 09/22/2014   NA 137 09/22/2014   K 3.5 09/22/2014   CL 101 09/22/2014   CREATININE 0.78 09/22/2014   BUN 10 09/22/2014   CO2 28 09/22/2014   TSH 0.395 09/22/2014   INR 1.1 03/15/2008   MICROALBUR <0.2 09/22/2014    Lab Results  Component Value Date   TSH 0.395 09/22/2014   Lab Results  Component Value Date   WBC 1.6* 10/16/2014   HGB 10.7* 10/16/2014   HCT 32.8* 10/16/2014   MCV 87 10/16/2014   PLT 105* 10/16/2014   Lab Results  Component Value Date   NA 137 09/22/2014   K 3.5 09/22/2014   CO2 28 09/22/2014   GLUCOSE 81 09/22/2014   BUN 10 09/22/2014   CREATININE 0.78 09/22/2014   BILITOT 0.6 09/22/2014   ALKPHOS 24* 09/22/2014   AST 15 09/22/2014   ALT 15 09/22/2014   PROT 7.5 09/22/2014   ALBUMIN 4.1 09/22/2014   CALCIUM 8.9 09/22/2014   GFR 96.10 09/26/2013   Lab Results  Component Value Date   CHOL 148 09/22/2014   Lab Results  Component Value Date   HDL 56 09/22/2014   Lab Results  Component Value Date   LDLCALC 80 09/22/2014   Lab Results  Component Value Date   TRIG 58 09/22/2014   Lab Results  Component Value Date   CHOLHDL 2.6 09/22/2014   No results found for: HGBA1C     Assessment & Plan:   Problem List Items Addressed This Visit    None    Visit Diagnoses    Keloid of skin    -  Primary    Relevant Orders    Ambulatory referral to Dermatology    Rash of face        Relevant Orders    Ambulatory referral to Dermatology       I am having Ms. Lechtenberg maintain her aspirin, multivitamin, and pregabalin.  No orders of the defined types were placed in this encounter.     Garnet Koyanagi, DO

## 2015-04-05 NOTE — Progress Notes (Signed)
Pre visit review using our clinic review tool, if applicable. No additional management support is needed unless otherwise documented below in the visit note. 

## 2015-10-09 ENCOUNTER — Telehealth: Payer: Self-pay | Admitting: Family Medicine

## 2015-10-09 ENCOUNTER — Ambulatory Visit: Payer: 59 | Admitting: Medical

## 2015-10-09 NOTE — Telephone Encounter (Signed)
No charge. 

## 2015-10-09 NOTE — Telephone Encounter (Signed)
Patient cancelled her 11:30am appointment for today due to car trouble, patient [facility] 10/12/15. Charge or no charge

## 2015-10-11 ENCOUNTER — Ambulatory Visit (INDEPENDENT_AMBULATORY_CARE_PROVIDER_SITE_OTHER): Payer: 59 | Admitting: Family Medicine

## 2015-10-11 ENCOUNTER — Encounter: Payer: Self-pay | Admitting: Family Medicine

## 2015-10-11 VITALS — BP 114/68 | HR 56 | Temp 98.2°F | Wt 135.4 lb

## 2015-10-11 DIAGNOSIS — F329 Major depressive disorder, single episode, unspecified: Secondary | ICD-10-CM | POA: Diagnosis not present

## 2015-10-11 DIAGNOSIS — Z8613 Personal history of malaria: Secondary | ICD-10-CM | POA: Diagnosis not present

## 2015-10-11 DIAGNOSIS — J0140 Acute pansinusitis, unspecified: Secondary | ICD-10-CM

## 2015-10-11 DIAGNOSIS — F32A Depression, unspecified: Secondary | ICD-10-CM

## 2015-10-11 DIAGNOSIS — R079 Chest pain, unspecified: Secondary | ICD-10-CM

## 2015-10-11 LAB — CBC WITH DIFFERENTIAL/PLATELET
BASOS ABS: 0 10*3/uL (ref 0.0–0.1)
Basophils Relative: 0.9 % (ref 0.0–3.0)
EOS PCT: 2.2 % (ref 0.0–5.0)
Eosinophils Absolute: 0.1 10*3/uL (ref 0.0–0.7)
HCT: 35.8 % — ABNORMAL LOW (ref 36.0–46.0)
Hemoglobin: 11.7 g/dL — ABNORMAL LOW (ref 12.0–15.0)
LYMPHS ABS: 1 10*3/uL (ref 0.7–4.0)
Lymphocytes Relative: 21.4 % (ref 12.0–46.0)
MCHC: 32.8 g/dL (ref 30.0–36.0)
MCV: 84.8 fl (ref 78.0–100.0)
Monocytes Absolute: 0.3 10*3/uL (ref 0.1–1.0)
Monocytes Relative: 7.1 % (ref 3.0–12.0)
NEUTROS ABS: 3.1 10*3/uL (ref 1.4–7.7)
NEUTROS PCT: 68.4 % (ref 43.0–77.0)
PLATELETS: 276 10*3/uL (ref 150.0–400.0)
RBC: 4.22 Mil/uL (ref 3.87–5.11)
RDW: 14.5 % (ref 11.5–15.5)
WBC: 4.5 10*3/uL (ref 4.0–10.5)

## 2015-10-11 LAB — COMPREHENSIVE METABOLIC PANEL
ALT: 27 U/L (ref 0–35)
AST: 17 U/L (ref 0–37)
Albumin: 4.2 g/dL (ref 3.5–5.2)
Alkaline Phosphatase: 47 U/L (ref 39–117)
BUN: 10 mg/dL (ref 6–23)
CO2: 32 meq/L (ref 19–32)
Calcium: 9.5 mg/dL (ref 8.4–10.5)
Chloride: 100 mEq/L (ref 96–112)
Creatinine, Ser: 0.88 mg/dL (ref 0.40–1.20)
GFR: 85.44 mL/min (ref >60.00)
Glucose, Bld: 74 mg/dL (ref 70–99)
POTASSIUM: 3.7 meq/L (ref 3.5–5.1)
Sodium: 136 mEq/L (ref 135–145)
Total Bilirubin: 0.3 mg/dL (ref 0.2–1.2)
Total Protein: 8.1 g/dL (ref 6.0–8.3)

## 2015-10-11 MED ORDER — AMOXICILLIN-POT CLAVULANATE 875-125 MG PO TABS: 1.0000 | ORAL_TABLET | Freq: Two times a day (BID) | ORAL | Status: DC

## 2015-10-11 MED ORDER — FLUTICASONE PROPIONATE 50 MCG/ACT NA SUSP: 2.0000 | Freq: Every day | NASAL | Status: DC

## 2015-10-11 NOTE — Patient Instructions (Signed)
Major Depressive Disorder Major depressive disorder is a mental illness. It also may be called clinical depression or unipolar depression. Major depressive disorder usually causes feelings of sadness, hopelessness, or helplessness. Some people with this disorder do not feel particularly sad but lose interest in doing things they used to enjoy (anhedonia). Major depressive disorder also can cause physical symptoms. It can interfere with work, school, relationships, and other normal everyday activities. The disorder varies in severity but is longer lasting and more serious than the sadness we all feel from time to time in our lives. Major depressive disorder often is triggered by stressful life events or major life changes. Examples of these triggers include divorce, loss of your job or home, a move, and the death of a family member or close friend. Sometimes this disorder occurs for no obvious reason at all. People who have family members with major depressive disorder or bipolar disorder are at higher risk for developing this disorder, with or without life stressors. Major depressive disorder can occur at any age. It may occur just once in your life (single episode major depressive disorder). It may occur multiple times (recurrent major depressive disorder). SYMPTOMS People with major depressive disorder have either anhedonia or depressed mood on nearly a daily basis for at least 2 weeks or longer. Symptoms of depressed mood include:  Feelings of sadness (blue or down in the dumps) or emptiness.  Feelings of hopelessness or helplessness.  Tearfulness or episodes of crying (may be observed by others).  Irritability (children and adolescents). In addition to depressed mood or anhedonia or both, people with this disorder have at least four of the following symptoms:  Difficulty sleeping or sleeping too much.   Significant change (increase or decrease) in appetite or weight.   Lack of energy or  motivation.  Feelings of guilt and worthlessness.   Difficulty concentrating, remembering, or making decisions.  Unusually slow movement (psychomotor retardation) or restlessness (as observed by others).   Recurrent wishes for death, recurrent thoughts of self-harm (suicide), or a suicide attempt. People with major depressive disorder commonly have persistent negative thoughts about themselves, other people, and the world. People with severe major depressive disorder may experiencedistorted beliefs or perceptions about the world (psychotic delusions). They also may see or hear things that are not real (psychotic hallucinations). DIAGNOSIS Major depressive disorder is diagnosed through an assessment by your health care provider. Your health care provider will ask aboutaspects of your daily life, such as mood,sleep, and appetite, to see if you have the diagnostic symptoms of major depressive disorder. Your health care provider may ask about your medical history and use of alcohol or drugs, including prescription medicines. Your health care provider also may do a physical exam and blood work. This is because certain medical conditions and the use of certain substances can cause major depressive disorder-like symptoms (secondary depression). Your health care provider also may refer you to a mental health specialist for further evaluation and treatment. TREATMENT It is important to recognize the symptoms of major depressive disorder and seek treatment. The following treatments can be prescribed for this disorder:   Medicine. Antidepressant medicines usually are prescribed. Antidepressant medicines are thought to correct chemical imbalances in the brain that are commonly associated with major depressive disorder. Other types of medicine may be added if the symptoms do not respond to antidepressant medicines alone or if psychotic delusions or hallucinations occur.  Talk therapy. Talk therapy can be  helpful in treating major depressive disorder by providing   support, education, and guidance. Certain types of talk therapy also can help with negative thinking (cognitive behavioral therapy) and with relationship issues that trigger this disorder (interpersonal therapy). A mental health specialist can help determine which treatment is best for you. Most people with major depressive disorder do well with a combination of medicine and talk therapy. Treatments involving electrical stimulation of the brain can be used in situations with extremely severe symptoms or when medicine and talk therapy do not work over time. These treatments include electroconvulsive therapy, transcranial magnetic stimulation, and vagal nerve stimulation.   This information is not intended to replace advice given to you by your health care provider. Make sure you discuss any questions you have with your health care provider.   Document Released: 11/22/2012 Document Revised: 08/18/2014 Document Reviewed: 11/22/2012 Elsevier Interactive Patient Education 2016 [employer]  

## 2015-10-11 NOTE — Progress Notes (Signed)
Patient ID: Jackie Casey, female    DOB: [birth year]  Age: 55 y.o. MRN: [id number]    Subjective:  Subjective HPI Jackie Casey presents for f/u from being diagnosed with Malaria in Dec.  She was also supposed to get married in April but her fiance called off the wedding and she has been depressed. She was still coughing.  No fever/ chills.  + congestion in sinuses   Review of Systems  Constitutional: Negative for diaphoresis, appetite change, fatigue and unexpected weight change.  Eyes: Negative for pain, redness and visual disturbance.  Respiratory: Negative for cough, chest tightness, shortness of breath and wheezing.   Cardiovascular: Negative for chest pain, palpitations and leg swelling.  Endocrine: Negative for cold intolerance, heat intolerance, polydipsia, polyphagia and polyuria.  Genitourinary: Negative for dysuria, frequency and difficulty urinating.  Neurological: Negative for dizziness, light-headedness, numbness and headaches.  Psychiatric/Behavioral: Positive for sleep disturbance and dysphoric mood. Negative for suicidal ideas and self-injury. The patient is not nervous/anxious.     History Past Medical History  Diagnosis Date  . Trigeminal neuralgia     She has past surgical history that includes Tonsillectomy.   Her family history includes Asthma in her mother; Hypertension in her mother; Stroke in her mother.She reports that she has never smoked. She has never used smokeless tobacco. She reports that she drinks alcohol. She reports that she does not use illicit drugs.  Current Outpatient Prescriptions on File Prior to Visit  Medication Sig Dispense Refill  . aspirin 81 MG EC tablet Take 81 mg by mouth. Two times weekly    . Multiple Vitamin (MULTIVITAMIN) tablet Take 1 tablet by mouth daily.      . pregabalin (LYRICA) 100 MG capsule Take 1 capsule (100 mg total) by mouth 2 (two) times daily. 180 capsule 3   No current facility-administered medications on file  prior to visit.     Objective:  Objective Physical Exam  Constitutional: She is oriented to person, place, and time. She appears well-developed and well-nourished.  HENT:  Head: Normocephalic and atraumatic.  Eyes: Conjunctivae and EOM are normal.  Neck: Normal range of motion. Neck supple. No JVD present. Carotid bruit is not present. No thyromegaly present.  Cardiovascular: Normal rate, regular rhythm and normal heart sounds.   No murmur heard. Pulmonary/Chest: Effort normal and breath sounds normal. No respiratory distress. She has no wheezes. She has no rales. She exhibits no tenderness.  Musculoskeletal: She exhibits no edema.  Neurological: She is alert and oriented to person, place, and time.  Psychiatric: Her speech is normal and behavior is normal. Judgment normal. Her mood appears not anxious. Thought content is not paranoid and not delusional. Cognition and memory are normal. She exhibits a depressed mood. She expresses no homicidal and no suicidal ideation. She expresses no suicidal plans and no homicidal plans.  Nursing note and vitals reviewed.  BP 114/68 mmHg  Pulse 56  Temp(Src) 98.2 F (36.8 C) (Oral)  Wt 135 lb 6.4 oz (61.417 kg)  SpO2 98% Wt Readings from Last 3 Encounters:  10/11/15 135 lb 6.4 oz (61.417 kg)  04/05/15 140 lb (63.504 kg)  10/16/14 142 lb (64.411 kg)     Lab Results  Component Value Date   WBC 4.5 10/11/2015   HGB 11.7* 10/11/2015   HCT 35.8* 10/11/2015   PLT 276.0 10/11/2015   GLUCOSE 74 10/11/2015   CHOL 148 09/22/2014   TRIG 58 09/22/2014   HDL 56 09/22/2014   LDLCALC 80 09/22/2014  ALT 27 10/11/2015   AST 17 10/11/2015   NA 136 10/11/2015   K 3.7 10/11/2015   CL 100 10/11/2015   CREATININE 0.88 10/11/2015   BUN 10 10/11/2015   CO2 32 10/11/2015   TSH 0.395 09/22/2014   INR 1.1 03/15/2008   MICROALBUR <0.2 09/22/2014    No results found.   Assessment & Plan:  Plan I am having Ms. Stennis start on fluticasone. I am also  having her maintain her aspirin, multivitamin, pregabalin, and amoxicillin-clavulanate.  Meds ordered this encounter  Medications  . DISCONTD: amoxicillin-clavulanate (AUGMENTIN) 875-125 MG tablet    Sig: Take 1 tablet by mouth 2 (two) times daily.    Dispense:  20 tablet    Refill:  0  . fluticasone (FLONASE) 50 MCG/ACT nasal spray    Sig: Place 2 sprays into both nostrils daily.    Dispense:  16 g    Refill:  6  . amoxicillin-clavulanate (AUGMENTIN) 875-125 MG tablet    Sig: Take 1 tablet by mouth 2 (two) times daily.    Dispense:  20 tablet    Refill:  0    Problem List Items Addressed This Visit    Depression    Pt feels with time she will get better She want no counseling or meds at this time She is not suicidal or homicidal       Other Visit Diagnoses    Chest pain, unspecified chest pain type    -  Primary    Relevant Orders    EKG 12-Lead (Completed)    Malaria Smear    History of malaria        Relevant Orders    CBC with Differential/Platelet (Completed)    Comp Met (CMET) (Completed)    Malaria Smear    Acute pansinusitis, recurrence not specified        Relevant Medications    fluticasone (FLONASE) 50 MCG/ACT nasal spray    amoxicillin-clavulanate (AUGMENTIN) 875-125 MG tablet    Other Relevant Orders    Malaria Smear       Follow-up: Return in about 4 weeks (around 11/08/2015), or if symptoms worsen or fail to improve, for depression.  Jackie Koyanagi, DO

## 2015-10-11 NOTE — Progress Notes (Signed)
Pre visit review using our clinic review tool, if applicable. No additional management support is needed unless otherwise documented below in the visit note. 

## 2015-10-14 DIAGNOSIS — F418 Other specified anxiety disorders: Secondary | ICD-10-CM | POA: Insufficient documentation

## 2015-10-14 DIAGNOSIS — F329 Major depressive disorder, single episode, unspecified: Secondary | ICD-10-CM | POA: Insufficient documentation

## 2015-10-14 NOTE — Assessment & Plan Note (Signed)
Pt feels with time she will get better She want no counseling or meds at this time She is not suicidal or homicidal

## 2015-10-15 LAB — MALARIA SMEAR

## 2015-10-17 ENCOUNTER — Telehealth: Payer: Self-pay

## 2015-10-17 NOTE — Telephone Encounter (Signed)
Results received from the Malaria Smear and it is Negative, Per Dr.Lowne's note if patient is not feeling any better we should repeat it.     KP

## 2015-10-19 NOTE — Telephone Encounter (Signed)
Spoke with patient and she [place] feeling better. She declined repeating the Malaria test.     KP

## 2015-11-21 ENCOUNTER — Encounter: Payer: Self-pay | Admitting: *Deleted

## 2015-11-21 ENCOUNTER — Telehealth: Payer: Self-pay | Admitting: *Deleted

## 2015-11-21 NOTE — Telephone Encounter (Signed)
Pre-Visit Call completed with patient and chart updated.   Pre-Visit Info documented in Specialty Comments under [employer].    

## 2015-11-22 ENCOUNTER — Other Ambulatory Visit (HOSPITAL_COMMUNITY)
Admission: RE | Admit: 2015-11-22 | Discharge: 2015-11-22 | Disposition: A | Discharge status: Home / Self Care | Payer: 59 | Source: Ambulatory Visit | Attending: Family Medicine | Admitting: Family Medicine

## 2015-11-22 ENCOUNTER — Encounter: Payer: Self-pay | Admitting: Family Medicine

## 2015-11-22 ENCOUNTER — Ambulatory Visit (INDEPENDENT_AMBULATORY_CARE_PROVIDER_SITE_OTHER): Payer: 59 | Admitting: Family Medicine

## 2015-11-22 VITALS — BP 108/68 | HR 54 | Temp 98.1°F | Ht 65.0 in | Wt 140.0 lb

## 2015-11-22 DIAGNOSIS — Z1159 Encounter for screening for other viral diseases: Secondary | ICD-10-CM | POA: Diagnosis not present

## 2015-11-22 DIAGNOSIS — Z01419 Encounter for gynecological examination (general) (routine) without abnormal findings: Secondary | ICD-10-CM | POA: Insufficient documentation

## 2015-11-22 DIAGNOSIS — Z0000 Encounter for general adult medical examination without abnormal findings: Secondary | ICD-10-CM

## 2015-11-22 DIAGNOSIS — Z114 Encounter for screening for human immunodeficiency virus [HIV]: Secondary | ICD-10-CM | POA: Diagnosis not present

## 2015-11-22 LAB — CBC WITH DIFFERENTIAL/PLATELET
BASOS ABS: 0 {cells}/uL (ref 0–200)
Basophils Relative: 0 %
EOS PCT: 1 %
Eosinophils Absolute: 23 cells/uL (ref 15–500)
HCT: 35 % (ref 35.0–45.0)
HEMOGLOBIN: 11 g/dL — AB (ref 11.7–15.5)
LYMPHS ABS: 1081 {cells}/uL (ref 850–3900)
Lymphocytes Relative: 47 %
MCH: 27.4 pg (ref 27.0–33.0)
MCHC: 31.4 g/dL — AB (ref 32.0–36.0)
MCV: 87.3 fL (ref 80.0–100.0)
MONOS PCT: 11 %
Monocytes Absolute: 253 cells/uL (ref 200–950)
NEUTROS PCT: 41 %
Neutro Abs: 943 cells/uL — ABNORMAL LOW (ref 1500–7800)
PLATELETS: 127 10*3/uL — AB (ref 140–400)
RBC: 4.01 MIL/uL (ref 3.80–5.10)
RDW: 13.8 % (ref 11.0–15.0)
WBC: 2.3 10*3/uL — AB (ref 3.8–10.8)

## 2015-11-22 LAB — COMPREHENSIVE METABOLIC PANEL
ALK PHOS: 34 U/L (ref 33–130)
ALT: 20 U/L (ref 6–29)
AST: 21 U/L (ref 10–35)
Albumin: 4.1 g/dL (ref 3.6–5.1)
BILIRUBIN TOTAL: 0.6 mg/dL (ref 0.2–1.2)
BUN: 9 mg/dL (ref 7–25)
CALCIUM: 9.1 mg/dL (ref 8.6–10.4)
CO2: 27 mmol/L (ref 20–31)
Chloride: 101 mmol/L (ref 98–110)
Creat: 0.9 mg/dL (ref 0.50–1.05)
GLUCOSE: 77 mg/dL (ref 65–99)
POTASSIUM: 3.9 mmol/L (ref 3.5–5.3)
Sodium: 139 mmol/L (ref 135–146)
TOTAL PROTEIN: 7.8 g/dL (ref 6.1–8.1)

## 2015-11-22 LAB — TSH: TSH: 0.65 mIU/L

## 2015-11-22 LAB — POCT URINALYSIS DIPSTICK
BILIRUBIN UA: NEGATIVE
Blood, UA: NEGATIVE
Glucose, UA: NEGATIVE
Ketones, UA: NEGATIVE
LEUKOCYTES UA: NEGATIVE
NITRITE UA: NEGATIVE
PH UA: 6.5
PROTEIN UA: NEGATIVE
Spec Grav, UA: <=1.005
UROBILINOGEN UA: 0.2

## 2015-11-22 LAB — LIPID PANEL
CHOLESTEROL: 139 mg/dL (ref 125–200)
HDL: 70 mg/dL (ref >=46)
LDL Cholesterol: 60 mg/dL (ref <130)
TRIGLYCERIDES: 45 mg/dL (ref <150)
Total CHOL/HDL Ratio: 2 Ratio (ref <=5.0)
VLDL: 9 mg/dL (ref <30)

## 2015-11-22 LAB — HIV ANTIBODY (ROUTINE TESTING W REFLEX): HIV: NONREACTIVE

## 2015-11-22 NOTE — Patient Instructions (Signed)
Preventive Care for Adults, Female A healthy lifestyle and preventive care can promote health and wellness. Preventive health guidelines for women include the following key practices.  A routine yearly physical is a good way to check with your health care provider about your health and preventive screening. It is a chance to share any concerns and updates on your health and to receive a thorough exam.  Visit your dentist for a routine exam and preventive care every 6 months. Brush your teeth twice a day and floss once a day. Good oral hygiene prevents tooth decay and gum disease.  The frequency of eye exams is based on your age, health, family medical history, use of contact lenses, and other factors. Follow your health care provider's recommendations for frequency of eye exams.  Eat a healthy diet. Foods like vegetables, fruits, whole grains, low-fat dairy products, and lean protein foods contain the nutrients you need without too many calories. Decrease your intake of foods high in solid fats, added sugars, and salt. Eat the right amount of calories for you.Get information about a proper diet from your health care provider, if necessary.  Regular physical exercise is one of the most important things you can do for your health. Most adults should get at least 150 minutes of moderate-intensity exercise (any activity that increases your heart rate and causes you to sweat) each week. In addition, most adults need muscle-strengthening exercises on 2 or more days a week.  Maintain a healthy weight. The body mass index (BMI) is a screening tool to identify possible weight problems. It provides an estimate of body fat based on height and weight. Your health care provider can find your BMI and can help you achieve or maintain a healthy weight.For adults 20 years and older:  A BMI below 18.5 is considered underweight.  A BMI of 18.5 to 24.9 is normal.  A BMI of 25 to 29.9 is considered overweight.  A  BMI of 30 and above is considered obese.  Maintain normal blood lipids and cholesterol levels by exercising and minimizing your intake of saturated fat. Eat a balanced diet with plenty of fruit and vegetables. Blood tests for lipids and cholesterol should begin at age 45 and be repeated every 5 years. If your lipid or cholesterol levels are high, you are over 50, or you are at high risk for heart disease, you may need your cholesterol levels checked more frequently.Ongoing high lipid and cholesterol levels should be treated with medicines if diet and exercise are not working.  If you smoke, find out from your health care provider how to quit. If you do not use tobacco, do not start.  Lung cancer screening is recommended for adults aged 45-80 years who are at high risk for developing lung cancer because of a history of smoking. A yearly low-dose CT scan of the lungs is recommended for people who have at least a 30-pack-year history of smoking and are a current smoker or have quit within the past 15 years. A pack year of smoking is smoking an average of 1 pack of cigarettes a day for 1 year (for example: 1 pack a day for 30 years or 2 packs a day for 15 years). Yearly screening should continue until the smoker has stopped smoking for at least 15 years. Yearly screening should be stopped for people who develop a health problem that would prevent them from having lung cancer treatment.  If you are pregnant, do not drink alcohol. If you are  breastfeeding, be very cautious about drinking alcohol. If you are not pregnant and choose to drink alcohol, do not have more than 1 drink per day. One drink is considered to be 12 ounces (355 mL) of beer, 5 ounces (148 mL) of wine, or 1.5 ounces (44 mL) of liquor.  Avoid use of street drugs. Do not share needles with anyone. Ask for help if you need support or instructions about stopping the use of drugs.  High blood pressure causes heart disease and increases the risk  of stroke. Your blood pressure should be checked at least every 1 to 2 years. Ongoing high blood pressure should be treated with medicines if weight loss and exercise do not work.  If you are 55-79 years old, ask your health care provider if you should take aspirin to prevent strokes.  Diabetes screening is done by taking a blood sample to check your blood glucose level after you have not eaten for a certain period of time (fasting). If you are not overweight and you do not have risk factors for diabetes, you should be screened once every 3 years starting at age 45. If you are overweight or obese and you are 40-70 years of age, you should be screened for diabetes every year as part of your cardiovascular risk assessment.  Breast cancer screening is essential preventive care for women. You should practice "breast self-awareness." This means understanding the normal appearance and feel of your breasts and may include breast self-examination. Any changes detected, no matter how small, should be reported to a health care provider. Women in their 20s and 30s should have a clinical breast exam (CBE) by a health care provider as part of a regular health exam every 1 to 3 years. After age 40, women should have a CBE every year. Starting at age 40, women should consider having a mammogram (breast X-ray test) every year. Women who have a family history of breast cancer should talk to their health care provider about genetic screening. Women at a high risk of breast cancer should talk to their health care providers about having an MRI and a mammogram every year.  Breast cancer gene (BRCA)-related cancer risk assessment is recommended for women who have family members with BRCA-related cancers. BRCA-related cancers include breast, ovarian, tubal, and peritoneal cancers. Having family members with these cancers may be associated with an increased risk for harmful changes (mutations) in the breast cancer genes BRCA1 and  BRCA2. Results of the assessment will determine the need for genetic counseling and BRCA1 and BRCA2 testing.  Your health care provider may recommend that you be screened regularly for cancer of the pelvic organs (ovaries, uterus, and vagina). This screening involves a pelvic examination, including checking for microscopic changes to the surface of your cervix (Pap test). You may be encouraged to have this screening done every 3 years, beginning at age 21.  For women ages 30-65, health care providers may recommend pelvic exams and Pap testing every 3 years, or they may recommend the Pap and pelvic exam, combined with testing for human papilloma virus (HPV), every 5 years. Some types of HPV increase your risk of cervical cancer. Testing for HPV may also be done on women of any age with unclear Pap test results.  Other health care providers may not recommend any screening for nonpregnant women who are considered low risk for pelvic cancer and who do not have symptoms. Ask your health care provider if a screening pelvic exam is right for   you.  If you have had past treatment for cervical cancer or a condition that could lead to cancer, you need Pap tests and screening for cancer for at least 20 years after your treatment. If Pap tests have been discontinued, your risk factors (such as having a new sexual partner) need to be reassessed to determine if screening should resume. Some women have medical problems that increase the chance of getting cervical cancer. In these cases, your health care provider may recommend more frequent screening and Pap tests.  Colorectal cancer can be detected and often prevented. Most routine colorectal cancer screening begins at the age of 50 years and continues through age 75 years. However, your health care provider may recommend screening at an earlier age if you have risk factors for colon cancer. On a yearly basis, your health care provider may provide home test kits to check  for hidden blood in the stool. Use of a small camera at the end of a tube, to directly examine the colon (sigmoidoscopy or colonoscopy), can detect the earliest forms of colorectal cancer. Talk to your health care provider about this at age 50, when routine screening begins. Direct exam of the colon should be repeated every 5-10 years through age 75 years, unless early forms of precancerous polyps or small growths are found.  People who are at an increased risk for hepatitis B should be screened for this virus. You are considered at high risk for hepatitis B if:  You were born in a country where hepatitis B occurs often. Talk with your health care provider about which countries are considered high risk.  Your parents were born in a high-risk country and you have not received a shot to protect against hepatitis B (hepatitis B vaccine).  You have HIV or AIDS.  You use needles to inject street drugs.  You live with, or have sex with, someone who has hepatitis B.  You get hemodialysis treatment.  You take certain medicines for conditions like cancer, organ transplantation, and autoimmune conditions.  Hepatitis C blood testing is recommended for all people born from 1945 through 1965 and any individual with known risks for hepatitis C.  Practice safe sex. Use condoms and avoid high-risk sexual practices to reduce the spread of sexually transmitted infections (STIs). STIs include gonorrhea, chlamydia, syphilis, trichomonas, herpes, HPV, and human immunodeficiency virus (HIV). Herpes, HIV, and HPV are viral illnesses that have no cure. They can result in disability, cancer, and death.  You should be screened for sexually transmitted illnesses (STIs) including gonorrhea and chlamydia if:  You are sexually active and are younger than 24 years.  You are older than 24 years and your health care provider tells you that you are at risk for this type of infection.  Your sexual activity has changed  since you were last screened and you are at an increased risk for chlamydia or gonorrhea. Ask your health care provider if you are at risk.  If you are at risk of being infected with HIV, it is recommended that you take a prescription medicine daily to prevent HIV infection. This is called preexposure prophylaxis (PrEP). You are considered at risk if:  You are sexually active and do not regularly use condoms or know the HIV status of your partner(s).  You take drugs by injection.  You are sexually active with a partner who has HIV.  Talk with your health care provider about whether you are at high risk of being infected with HIV. If   you choose to begin PrEP, you should first be tested for HIV. You should then be tested every 3 months for as long as you are taking PrEP.  Osteoporosis is a disease in which the bones lose minerals and strength with aging. This can result in serious bone fractures or breaks. The risk of osteoporosis can be identified using a bone density scan. Women ages 67 years and over and women at risk for fractures or osteoporosis should discuss screening with their health care providers. Ask your health care provider whether you should take a calcium supplement or vitamin D to reduce the rate of osteoporosis.  Menopause can be associated with physical symptoms and risks. Hormone replacement therapy is available to decrease symptoms and risks. You should talk to your health care provider about whether hormone replacement therapy is right for you.  Use sunscreen. Apply sunscreen liberally and repeatedly throughout the day. You should seek shade when your shadow is shorter than you. Protect yourself by wearing long sleeves, pants, a wide-brimmed hat, and sunglasses year round, whenever you are outdoors.  Once a month, do a whole body skin exam, using a mirror to look at the skin on your back. Tell your health care provider of new moles, moles that have irregular borders, moles that  are larger than a pencil eraser, or moles that have changed in shape or color.  Stay current with required vaccines (immunizations).  Influenza vaccine. All adults should be immunized every year.  Tetanus, diphtheria, and acellular pertussis (Td, Tdap) vaccine. Pregnant women should receive 1 dose of Tdap vaccine during each pregnancy. The dose should be obtained regardless of the length of time since the last dose. Immunization is preferred during the 27th-36th week of gestation. An adult who has not previously received Tdap or who does not know her vaccine status should receive 1 dose of Tdap. This initial dose should be followed by tetanus and diphtheria toxoids (Td) booster doses every 10 years. Adults with an unknown or incomplete history of completing a 3-dose immunization series with Td-containing vaccines should begin or complete a primary immunization series including a Tdap dose. Adults should receive a Td booster every 10 years.  Varicella vaccine. An adult without evidence of immunity to varicella should receive 2 doses or a second dose if she has previously received 1 dose. Pregnant females who do not have evidence of immunity should receive the first dose after pregnancy. This first dose should be obtained before leaving the health care facility. The second dose should be obtained 4-8 weeks after the first dose.  Human papillomavirus (HPV) vaccine. Females aged 13-26 years who have not received the vaccine previously should obtain the 3-dose series. The vaccine is not recommended for use in pregnant females. However, pregnancy testing is not needed before receiving a dose. If a female is found to be pregnant after receiving a dose, no treatment is needed. In that case, the remaining doses should be delayed until after the pregnancy. Immunization is recommended for any person with an immunocompromised condition through the age of 61 years if she did not get any or all doses earlier. During the  3-dose series, the second dose should be obtained 4-8 weeks after the first dose. The third dose should be obtained 24 weeks after the first dose and 16 weeks after the second dose.  Zoster vaccine. One dose is recommended for adults aged 30 years or older unless certain conditions are present.  Measles, mumps, and rubella (MMR) vaccine. Adults born  before 1957 generally are considered immune to measles and mumps. Adults born in 1957 or later should have 1 or more doses of MMR vaccine unless there is a contraindication to the vaccine or there is laboratory evidence of immunity to each of the three diseases. A routine second dose of MMR vaccine should be obtained at least 28 days after the first dose for students attending postsecondary schools, health care workers, or international travelers. People who received inactivated measles vaccine or an unknown type of measles vaccine during 1963-1967 should receive 2 doses of MMR vaccine. People who received inactivated mumps vaccine or an unknown type of mumps vaccine before 1979 and are at high risk for mumps infection should consider immunization with 2 doses of MMR vaccine. For females of childbearing age, rubella immunity should be determined. If there is no evidence of immunity, females who are not pregnant should be vaccinated. If there is no evidence of immunity, females who are pregnant should delay immunization until after pregnancy. Unvaccinated health care workers born before 1957 who lack laboratory evidence of measles, mumps, or rubella immunity or laboratory confirmation of disease should consider measles and mumps immunization with 2 doses of MMR vaccine or rubella immunization with 1 dose of MMR vaccine.  Pneumococcal 13-valent conjugate (PCV13) vaccine. When indicated, a person who is uncertain of his immunization history and has no record of immunization should receive the PCV13 vaccine. All adults 65 years of age and older should receive this  vaccine. An adult aged 19 years or older who has certain medical conditions and has not been previously immunized should receive 1 dose of PCV13 vaccine. This PCV13 should be followed with a dose of pneumococcal polysaccharide (PPSV23) vaccine. Adults who are at high risk for pneumococcal disease should obtain the PPSV23 vaccine at least 8 weeks after the dose of PCV13 vaccine. Adults older than 56 years of age who have normal immune system function should obtain the PPSV23 vaccine dose at least 1 year after the dose of PCV13 vaccine.  Pneumococcal polysaccharide (PPSV23) vaccine. When PCV13 is also indicated, PCV13 should be obtained first. All adults aged 65 years and older should be immunized. An adult younger than age 65 years who has certain medical conditions should be immunized. Any person who resides in a nursing home or long-term care facility should be immunized. An adult smoker should be immunized. People with an immunocompromised condition and certain other conditions should receive both PCV13 and PPSV23 vaccines. People with human immunodeficiency virus (HIV) infection should be immunized as soon as possible after diagnosis. Immunization during chemotherapy or radiation therapy should be avoided. Routine use of PPSV23 vaccine is not recommended for American Indians, [place] Natives, or people younger than 65 years unless there are medical conditions that require PPSV23 vaccine. When indicated, people who have unknown immunization and have no record of immunization should receive PPSV23 vaccine. One-time revaccination 5 years after the first dose of PPSV23 is recommended for people aged 19-64 years who have chronic kidney failure, nephrotic syndrome, asplenia, or immunocompromised conditions. People who received 1-2 doses of PPSV23 before age 65 years should receive another dose of PPSV23 vaccine at age 65 years or later if at least 5 years have passed since the previous dose. Doses of PPSV23 are not  needed for people immunized with PPSV23 at or after age 65 years.  Meningococcal vaccine. Adults with asplenia or persistent complement component deficiencies should receive 2 doses of quadrivalent meningococcal conjugate (MenACWY-D) vaccine. The doses should be obtained   at least 2 months apart. Microbiologists working with certain meningococcal bacteria, [place] recruits, people at risk during an outbreak, and people who travel to or live in countries with a high rate of meningitis should be immunized. A first-year college student up through age 34 years who is living in a residence hall should receive a dose if she did not receive a dose on or after her 16th birthday. Adults who have certain high-risk conditions should receive one or more doses of vaccine.  Hepatitis A vaccine. Adults who wish to be protected from this disease, have certain high-risk conditions, work with hepatitis A-infected animals, work in hepatitis A research labs, or travel to or work in countries with a high rate of hepatitis A should be immunized. Adults who were previously unvaccinated and who anticipate close contact with an international adoptee during the first 60 days after arrival in the [place] from a country with a high rate of hepatitis A should be immunized.  Hepatitis B vaccine. Adults who wish to be protected from this disease, have certain high-risk conditions, may be exposed to blood or other infectious body fluids, are household contacts or sex partners of hepatitis B positive people, are clients or workers in certain care facilities, or travel to or work in countries with a high rate of hepatitis B should be immunized.  Haemophilus influenzae type b (Hib) vaccine. A previously unvaccinated person with asplenia or sickle cell disease or having a scheduled splenectomy should receive 1 dose of Hib vaccine. Regardless of previous immunization, a recipient of a hematopoietic stem cell transplant should receive a  3-dose series 6-12 months after her successful transplant. Hib vaccine is not recommended for adults with HIV infection. Preventive Services / Frequency Ages 35 to 4 years  Blood pressure check.** / Every 3-5 years.  Lipid and cholesterol check.** / Every 5 years beginning at age 60.  Clinical breast exam.** / Every 3 years for women in their 71s and 10s.  BRCA-related cancer risk assessment.** / For women who have family members with a BRCA-related cancer (breast, ovarian, tubal, or peritoneal cancers).  Pap test.** / Every 2 years from ages 76 through 26. Every 3 years starting at age 61 through age 76 or 93 with a history of 3 consecutive normal Pap tests.  HPV screening.** / Every 3 years from ages 37 through ages 60 to 51 with a history of 3 consecutive normal Pap tests.  Hepatitis C blood test.** / For any individual with known risks for hepatitis C.  Skin self-exam. / Monthly.  Influenza vaccine. / Every year.  Tetanus, diphtheria, and acellular pertussis (Tdap, Td) vaccine.** / Consult your health care provider. Pregnant women should receive 1 dose of Tdap vaccine during each pregnancy. 1 dose of Td every 10 years.  Varicella vaccine.** / Consult your health care provider. Pregnant females who do not have evidence of immunity should receive the first dose after pregnancy.  HPV vaccine. / 3 doses over 6 months, if 93 and younger. The vaccine is not recommended for use in pregnant females. However, pregnancy testing is not needed before receiving a dose.  Measles, mumps, rubella (MMR) vaccine.** / You need at least 1 dose of MMR if you were born in 1957 or later. You may also need a 2nd dose. For females of childbearing age, rubella immunity should be determined. If there is no evidence of immunity, females who are not pregnant should be vaccinated. If there is no evidence of immunity, females who are  pregnant should delay immunization until after pregnancy.  Pneumococcal  13-valent conjugate (PCV13) vaccine.** / Consult your health care provider.  Pneumococcal polysaccharide (PPSV23) vaccine.** / 1 to 2 doses if you smoke cigarettes or if you have certain conditions.  Meningococcal vaccine.** / 1 dose if you are age 68 to 8 years and a Market researcher living in a residence hall, or have one of several medical conditions, you need to get vaccinated against meningococcal disease. You may also need additional booster doses.  Hepatitis A vaccine.** / Consult your health care provider.  Hepatitis B vaccine.** / Consult your health care provider.  Haemophilus influenzae type b (Hib) vaccine.** / Consult your health care provider. Ages 7 to 53 years  Blood pressure check.** / Every year.  Lipid and cholesterol check.** / Every 5 years beginning at age 25 years.  Lung cancer screening. / Every year if you are aged 11-80 years and have a 30-pack-year history of smoking and currently smoke or have quit within the past 15 years. Yearly screening is stopped once you have quit smoking for at least 15 years or develop a health problem that would prevent you from having lung cancer treatment.  Clinical breast exam.** / Every year after age 48 years.  BRCA-related cancer risk assessment.** / For women who have family members with a BRCA-related cancer (breast, ovarian, tubal, or peritoneal cancers).  Mammogram.** / Every year beginning at age 41 years and continuing for as long as you are in good health. Consult with your health care provider.  Pap test.** / Every 3 years starting at age 65 years through age 37 or 70 years with a history of 3 consecutive normal Pap tests.  HPV screening.** / Every 3 years from ages 72 years through ages 60 to 40 years with a history of 3 consecutive normal Pap tests.  Fecal occult blood test (FOBT) of stool. / Every year beginning at age 21 years and continuing until age 5 years. You may not need to do this test if you get  a colonoscopy every 10 years.  Flexible sigmoidoscopy or colonoscopy.** / Every 5 years for a flexible sigmoidoscopy or every 10 years for a colonoscopy beginning at age 35 years and continuing until age 48 years.  Hepatitis C blood test.** / For all people born from [birth year] through 1965 and any individual with known risks for hepatitis C.  Skin self-exam. / Monthly.  Influenza vaccine. / Every year.  Tetanus, diphtheria, and acellular pertussis (Tdap/Td) vaccine.** / Consult your health care provider. Pregnant women should receive 1 dose of Tdap vaccine during each pregnancy. 1 dose of Td every 10 years.  Varicella vaccine.** / Consult your health care provider. Pregnant females who do not have evidence of immunity should receive the first dose after pregnancy.  Zoster vaccine.** / 1 dose for adults aged 30 years or older.  Measles, mumps, rubella (MMR) vaccine.** / You need at least 1 dose of MMR if you were born in 1957 or later. You may also need a second dose. For females of childbearing age, rubella immunity should be determined. If there is no evidence of immunity, females who are not pregnant should be vaccinated. If there is no evidence of immunity, females who are pregnant should delay immunization until after pregnancy.  Pneumococcal 13-valent conjugate (PCV13) vaccine.** / Consult your health care provider.  Pneumococcal polysaccharide (PPSV23) vaccine.** / 1 to 2 doses if you smoke cigarettes or if you have certain conditions.  Meningococcal vaccine.** /  Consult your health care provider.  Hepatitis A vaccine.** / Consult your health care provider.  Hepatitis B vaccine.** / Consult your health care provider.  Haemophilus influenzae type b (Hib) vaccine.** / Consult your health care provider. Ages 64 years and over  Blood pressure check.** / Every year.  Lipid and cholesterol check.** / Every 5 years beginning at age 23 years.  Lung cancer screening. / Every year if you  are aged 16-80 years and have a 30-pack-year history of smoking and currently smoke or have quit within the past 15 years. Yearly screening is stopped once you have quit smoking for at least 15 years or develop a health problem that would prevent you from having lung cancer treatment.  Clinical breast exam.** / Every year after age 74 years.  BRCA-related cancer risk assessment.** / For women who have family members with a BRCA-related cancer (breast, ovarian, tubal, or peritoneal cancers).  Mammogram.** / Every year beginning at age 44 years and continuing for as long as you are in good health. Consult with your health care provider.  Pap test.** / Every 3 years starting at age 58 years through age 22 or 39 years with 3 consecutive normal Pap tests. Testing can be stopped between 65 and 70 years with 3 consecutive normal Pap tests and no abnormal Pap or HPV tests in the past 10 years.  HPV screening.** / Every 3 years from ages 64 years through ages 70 or 61 years with a history of 3 consecutive normal Pap tests. Testing can be stopped between 65 and 70 years with 3 consecutive normal Pap tests and no abnormal Pap or HPV tests in the past 10 years.  Fecal occult blood test (FOBT) of stool. / Every year beginning at age 40 years and continuing until age 27 years. You may not need to do this test if you get a colonoscopy every 10 years.  Flexible sigmoidoscopy or colonoscopy.** / Every 5 years for a flexible sigmoidoscopy or every 10 years for a colonoscopy beginning at age 7 years and continuing until age 32 years.  Hepatitis C blood test.** / For all people born from [birth year] through 1966 and any individual with known risks for hepatitis C.  Osteoporosis screening.** / A one-time screening for women ages 30 years and over and women at risk for fractures or osteoporosis.  Skin self-exam. / Monthly.  Influenza vaccine. / Every year.  Tetanus, diphtheria, and acellular pertussis (Tdap/Td)  vaccine.** / 1 dose of Td every 10 years.  Varicella vaccine.** / Consult your health care provider.  Zoster vaccine.** / 1 dose for adults aged 35 years or older.  Pneumococcal 13-valent conjugate (PCV13) vaccine.** / Consult your health care provider.  Pneumococcal polysaccharide (PPSV23) vaccine.** / 1 dose for all adults aged 46 years and older.  Meningococcal vaccine.** / Consult your health care provider.  Hepatitis A vaccine.** / Consult your health care provider.  Hepatitis B vaccine.** / Consult your health care provider.  Haemophilus influenzae type b (Hib) vaccine.** / Consult your health care provider. ** Family history and personal history of risk and conditions may change your health care provider's recommendations.   This information is not intended to replace advice given to you by your health care provider. Make sure you discuss any questions you have with your health care provider.   Document Released: 09/23/2001 Document Revised: 08/18/2014 Document Reviewed: 12/23/2010 Elsevier Interactive Patient Education [employer].

## 2015-11-22 NOTE — Progress Notes (Signed)
Pre visit review using our clinic review tool, if applicable. No additional management support is needed unless otherwise documented below in the visit note. 

## 2015-11-22 NOTE — Progress Notes (Signed)
Subjective:     Jackie Casey is a 55 y.o. female and is here for a comprehensive physical exam. The patient reports no problems.  Social History   Social History  . Marital Status: Single    Spouse Name: N/A  . Number of Children: N/A  . Years of Education: N/A   Occupational History  . ADMINISTRATIVE Paramedic   Social History Main Topics  . Smoking status: Never Smoker   . Smokeless tobacco: Never Used     Comment: NEVER USED TOBACCO  . Alcohol Use: 0.0 oz/week    0 Standard drinks or equivalent per week     Comment: rare--  1-2 x a month  . Drug Use: No  . Sexual Activity:    Partners: Male    Birth Control/ Protection: None   Other Topics Concern  . Not on file   Social History Narrative   Exercise-- crunches, jumping jacks etc   Health Maintenance  Topic Date Due  . INFLUENZA VACCINE  10/10/2016 (Originally 03/11/2016)  . MAMMOGRAM  08/29/2016  . PAP SMEAR  09/22/2017  . COLONOSCOPY  12/25/2019  . TETANUS/TDAP  12/01/2020  . Hepatitis C Screening  Completed  . HIV Screening  Completed    The following portions of the patient's history were reviewed and updated as appropriate:  She  has a past medical history of Trigeminal neuralgia. She  does not have any pertinent problems on file. She  has past surgical history that includes Tonsillectomy. Her family history includes Asthma in her mother; Hypertension in her mother; Stroke in her mother. She  reports that she has never smoked. She has never used smokeless tobacco. She reports that she drinks alcohol. She reports that she does not use illicit drugs. She has a current medication list which includes the following prescription(s): aspirin, biotin w/ vitamins c & e, multivitamin, and pregabalin. Current Outpatient Prescriptions on File Prior to Visit  Medication Sig Dispense Refill  . aspirin 81 MG EC tablet Take 81 mg by mouth. Two times weekly    . Biotin w/ Vitamins C & E  (HAIR/SKIN/NAILS PO) Take 1 tablet by mouth daily.    . Multiple Vitamin (MULTIVITAMIN) tablet Take 1 tablet by mouth daily.      . pregabalin (LYRICA) 100 MG capsule Take 1 capsule (100 mg total) by mouth 2 (two) times daily. 180 capsule 3   No current facility-administered medications on file prior to visit.   She has No Known Allergies..  Review of Systems Review of Systems  Constitutional: Negative for activity change, appetite change and fatigue.  HENT: Negative for hearing loss, congestion, tinnitus and ear discharge.  dentist q41m Eyes: Negative for visual disturbance (see optho q1y -- vision corrected to 20/20 with glasses).  Respiratory: Negative for cough, chest tightness and shortness of breath.   Cardiovascular: Negative for chest pain, palpitations and leg swelling.  Gastrointestinal: Negative for abdominal pain, diarrhea, constipation and abdominal distention.  Genitourinary: Negative for urgency, frequency, decreased urine volume and difficulty urinating.  Musculoskeletal: Negative for back pain, arthralgias and gait problem.  Skin: Negative for color change, pallor and rash.  Neurological: Negative for dizziness, light-headedness, numbness and headaches.  Hematological: Negative for adenopathy. Does not bruise/bleed easily.  Psychiatric/Behavioral: Negative for suicidal ideas, confusion, sleep disturbance, self-injury, dysphoric mood, decreased concentration and agitation.       Objective:    BP 108/68 mmHg  Pulse 54  Temp(Src) 98.1 F (36.7 C) (Oral)  Ht 5\' 5"  (1.651 m)  Wt 140 lb (63.504 kg)  BMI 23.30 kg/m2  SpO2 95% General appearance: alert, cooperative, appears stated age and no distress Head: Normocephalic, without obvious abnormality, atraumatic Eyes: conjunctivae/corneas clear. PERRL, EOM's intact. Fundi benign. Ears: normal TM's and external ear canals both ears Nose: Nares normal. Septum midline. Mucosa normal. No drainage or sinus  tenderness. Throat: lips, mucosa, and tongue normal; teeth and gums normal Neck: no adenopathy, no carotid bruit, no JVD, supple, symmetrical, trachea midline and thyroid not enlarged, symmetric, no tenderness/mass/nodules Back: symmetric, no curvature. ROM normal. No CVA tenderness. Lungs: clear to auscultation bilaterally Breasts: normal appearance, no masses or tenderness Heart: regular rate and rhythm, S1, S2 normal, no murmur, click, rub or gallop Abdomen: soft, non-tender; bowel sounds normal; no masses,  no organomegaly Pelvic: cervix normal in appearance, external genitalia normal, no adnexal masses or tenderness, no cervical motion tenderness, rectovaginal septum normal, uterus normal size, shape, and consistency, vagina normal without discharge and pap done, heme neg brown stool Extremities: extremities normal, atraumatic, no cyanosis or edema Pulses: 2+ and symmetric Skin: Skin color, texture, turgor normal. No rashes or lesions Lymph nodes: Cervical, supraclavicular, and axillary nodes normal. Neurologic: Alert and oriented X 3, normal strength and tone. Normal symmetric reflexes. Normal coordination and gait Psych- no depression, no anxiety      Assessment:    Healthy female exam.      Plan:    ghm utd Check labs See After Visit Summary for Counseling Recommendations

## 2015-11-23 LAB — TECH ORDERED PATH REVIEW

## 2015-11-23 LAB — PATHOLOGIST SMEAR REVIEW

## 2015-11-23 LAB — HEPATITIS C ANTIBODY: HCV AB: NEGATIVE

## 2015-11-27 LAB — CYTOLOGY - PAP

## 2015-11-28 ENCOUNTER — Other Ambulatory Visit: Payer: Self-pay

## 2015-11-28 MED ORDER — PREGABALIN 100 MG PO CAPS: 100.0000 mg | ORAL_CAPSULE | Freq: Two times a day (BID) | ORAL | Status: DC

## 2015-11-29 LAB — HM MAMMOGRAPHY

## 2015-12-10 ENCOUNTER — Telehealth: Payer: Self-pay | Admitting: Family Medicine

## 2015-12-10 NOTE — Telephone Encounter (Signed)
Relation to [id number] Call back [place] ([address] [place] [phone] (Phone) [phone] (Fax)         Reason for call:  Patient was checking on the status of physical form patient states she dropped off form 11/22/15 and would like forms faxed to employment.

## 2015-12-11 NOTE — Telephone Encounter (Signed)
They were faxed 2 weeks ago.    KP

## 2015-12-11 NOTE — Telephone Encounter (Signed)
Kim, have you seen this form? Thanks, JG//CMA

## 2015-12-11 NOTE — Telephone Encounter (Signed)
Patient called back to follow up on CPE form. Wants to know when it will be faxed to her employer. Plse adv

## 2015-12-12 ENCOUNTER — Encounter: Payer: Self-pay | Admitting: Family Medicine

## 2015-12-12 NOTE — Telephone Encounter (Signed)
Patient states employer never received requesting re fax. Please advise

## 2015-12-12 NOTE — Telephone Encounter (Signed)
I do not have the forms and they are not scanned into pt's chart. Once they are scanned, we can re-fax. JG//CMA

## 2015-12-13 NOTE — Telephone Encounter (Signed)
Patient called to check the status of her CPE form that she needs for her employer. Patient was upset because she had called every day this week and no one could tell her when the form would be scanned into her chart so that it could be faxed to her employer again. Attempted to explain to the patient the process of getting forms scanned into charts and informed her again that the form was originally faxed to her employer 2 weeks ago. Patient became very disrespectful and begin to yell into the phone. Asked her not to yell but she continued. Informed patient that I would have someone else give her a call and she then hung up.

## 2015-12-13 NOTE — Telephone Encounter (Signed)
Spoke with patient and she stated she just wanted to know if someone was going to re-fax the forms, I made her aware, I would re-fax the forms and she said thank you. The wellness form is still on my desk and I made her aware I hold on to them for a full month before that are sent to scan. She verbalized understanding, I also advised when they were sent 2 weeks ago we did receive confirmation that they received it. She verbalized understating. I ave re-faxed and advised her to call her employer later today and if they do not receive the fax to give me a call back and if they do not re ive it this time I will leave the form at check in and the patient can come in and pick them up and send them herself.    Kp

## 2015-12-19 ENCOUNTER — Telehealth: Payer: Self-pay | Admitting: *Deleted

## 2015-12-19 NOTE — Telephone Encounter (Signed)
Received fax from [employer] that pt has been accepted until 08/10/2016. The medication listed below was shipped to pt's home address:   Lyrica capsules 100mg , QTY: 180  Approval letter sent for scanning. JG//CMA

## 2016-09-02 ENCOUNTER — Ambulatory Visit (INDEPENDENT_AMBULATORY_CARE_PROVIDER_SITE_OTHER): Payer: 59 | Admitting: Family Medicine

## 2016-09-02 ENCOUNTER — Encounter: Payer: Self-pay | Admitting: Family Medicine

## 2016-09-02 VITALS — BP 126/68 | HR 52 | Temp 97.9°F | Resp 16 | Ht 65.0 in | Wt 151.4 lb

## 2016-09-02 DIAGNOSIS — G500 Trigeminal neuralgia: Secondary | ICD-10-CM

## 2016-09-02 DIAGNOSIS — Z23 Encounter for immunization: Secondary | ICD-10-CM

## 2016-09-02 DIAGNOSIS — Z0000 Encounter for general adult medical examination without abnormal findings: Secondary | ICD-10-CM

## 2016-09-02 DIAGNOSIS — Z9109 Other allergy status, other than to drugs and biological substances: Secondary | ICD-10-CM | POA: Diagnosis not present

## 2016-09-02 LAB — POC URINALSYSI DIPSTICK (AUTOMATED)
BILIRUBIN UA: NEGATIVE
Blood, UA: NEGATIVE
Glucose, UA: NEGATIVE
Ketones, UA: NEGATIVE
Leukocytes, UA: NEGATIVE
NITRITE UA: NEGATIVE
PH UA: 7
Protein, UA: NEGATIVE
SPEC GRAV UA: 1.015
UROBILINOGEN UA: 0.2

## 2016-09-02 MED ORDER — FLUTICASONE PROPIONATE 50 MCG/ACT NA SUSP: 2.0000 | Freq: Every day | NASAL | 6 refills | Status: DC

## 2016-09-02 MED ORDER — LEVOCETIRIZINE DIHYDROCHLORIDE 5 MG PO TABS: 5.0000 mg | ORAL_TABLET | Freq: Every evening | ORAL | 5 refills | Status: DC

## 2016-09-02 MED ORDER — PREGABALIN 100 MG PO CAPS: 100.0000 mg | ORAL_CAPSULE | Freq: Two times a day (BID) | ORAL | 3 refills | Status: DC

## 2016-09-02 NOTE — Progress Notes (Signed)
Subjective:     Jackie Casey is a 56 y.o. female and is here for a comprehensive physical exam. The patient reports no problems.  Social History   Social History  . Marital status: Single    Spouse name: N/A  . Number of children: N/A  . Years of education: N/A   Occupational History  . ADMINISTRATIVE Paramedic   Social History Main Topics  . Smoking status: Never Smoker  . Smokeless tobacco: Never Used     Comment: NEVER USED TOBACCO  . Alcohol use 0.0 oz/week     Comment: rare--  1-2 x a month  . Drug use: No  . Sexual activity: Yes    Partners: Male    Birth control/ protection: None   Other Topics Concern  . Not on file   Social History Narrative   Exercise-- crunches, jumping jacks etc   Health Maintenance  Topic Date Due  . INFLUENZA VACCINE  10/10/2016 (Originally 03/11/2016)  . MAMMOGRAM  11/28/2017  . PAP SMEAR  11/22/2018  . COLONOSCOPY  12/25/2019  . TETANUS/TDAP  12/01/2020  . Hepatitis C Screening  Completed  . HIV Screening  Completed    The following portions of the patient's history were reviewed and updated as appropriate: She  has a past medical history of Depression and Trigeminal neuralgia. She  does not have any pertinent problems on file. She  has a past surgical history that includes Tonsillectomy. Her family history includes Asthma in her mother; Hypertension in her mother; Stroke in her mother. She  reports that she has never smoked. She has never used smokeless tobacco. She reports that she drinks alcohol. She reports that she does not use drugs. She has a current medication list which includes the following prescription(s): aspirin, biotin w/ vitamins c & e, echinacea, multivitamin, pregabalin, fluticasone, and levocetirizine. Current Outpatient Prescriptions on File Prior to Visit  Medication Sig Dispense Refill  . aspirin 81 MG EC tablet Take 81 mg by mouth. Two times weekly    . Biotin w/ Vitamins C & E  (HAIR/SKIN/NAILS PO) Take 1 tablet by mouth daily.    . Multiple Vitamin (MULTIVITAMIN) tablet Take 1 tablet by mouth daily.       No current facility-administered medications on file prior to visit.    She has No Known Allergies..  Review of Systems Review of Systems  Constitutional: Negative for activity change, appetite change and fatigue.  HENT: Negative for hearing loss, congestion, tinnitus and ear discharge.  dentist q24m Eyes: Negative for visual disturbance (see optho q1y -- vision corrected to 20/20 with glasses).  Respiratory: Negative for cough, chest tightness and shortness of breath.   Cardiovascular: Negative for chest pain, palpitations and leg swelling.  Gastrointestinal: Negative for abdominal pain, diarrhea, constipation and abdominal distention.  Genitourinary: Negative for urgency, frequency, decreased urine volume and difficulty urinating.  Musculoskeletal: Negative for back pain, arthralgias and gait problem.  Skin: Negative for color change, pallor and rash.  Neurological: Negative for dizziness, light-headedness, numbness and headaches.  Hematological: Negative for adenopathy. Does not bruise/bleed easily.  Psychiatric/Behavioral: Negative for suicidal ideas, confusion, sleep disturbance, self-injury, dysphoric mood, decreased concentration and agitation.       Objective:    BP 126/68 (BP Location: Left Arm, Cuff Size: Normal)   Pulse (!) 52   Temp 97.9 F (36.6 C) (Oral)   Resp 16   Ht 5\' 5"  (1.651 m)   Wt 151 lb 6.4 oz (  68.7 kg)   LMP 04/01/2016   SpO2 98%   BMI 25.19 kg/m  General appearance: alert, cooperative, appears stated age and no distress Head: Normocephalic, without obvious abnormality, atraumatic Eyes: conjunctivae/corneas clear. PERRL, EOM's intact. Fundi benign. Ears: normal TM's and external ear canals both ears Nose: Nares normal. Septum midline. Mucosa normal. No drainage or sinus tenderness. Throat: lips, mucosa, and tongue  normal; teeth and gums normal Neck: no adenopathy, no carotid bruit, no JVD, supple, symmetrical, trachea midline and thyroid not enlarged, symmetric, no tenderness/mass/nodules Back: symmetric, no curvature. ROM normal. No CVA tenderness. Lungs: clear to auscultation bilaterally Breasts: normal appearance, no masses or tenderness Heart: regular rate and rhythm, S1, S2 normal, no murmur, click, rub or gallop Abdomen: soft, non-tender; bowel sounds normal; no masses,  no organomegaly Pelvic: deferred Extremities: extremities normal, atraumatic, no cyanosis or edema Pulses: 2+ and symmetric Skin: Skin color, texture, turgor normal. No rashes or lesions Lymph nodes: Cervical, supraclavicular, and axillary nodes normal. Neurologic: Alert and oriented X 3, normal strength and tone. Normal symmetric reflexes. Normal coordination and gait    Assessment:    Healthy female exam.     Plan:    ghm utd Check labs See After Visit Summary for Counseling Recommendations    1. Preventative health care See above - Comprehensive metabolic panel - Lipid panel - POCT urinalysis dipstick - TSH - CBC with Differential/Platelet  2. Environmental allergies   - fluticasone (FLONASE) 50 MCG/ACT nasal spray; Place 2 sprays into both nostrils daily.  Dispense: 16 g; Refill: 6 - levocetirizine (XYZAL) 5 MG tablet; Take 1 tablet (5 mg total) by mouth every evening.  Dispense: 30 tablet; Refill: 5  3. Trigeminal neuralgia   - pregabalin (LYRICA) 100 MG capsule; Take 1 capsule (100 mg total) by mouth 2 (two) times daily.  Dispense: 180 capsule; Refill: 3

## 2016-09-02 NOTE — Progress Notes (Signed)
Pre visit review using our clinic review tool, if applicable. No additional management support is needed unless otherwise documented below in the visit note. 

## 2016-09-02 NOTE — Patient Instructions (Signed)

## 2016-09-03 ENCOUNTER — Telehealth: Payer: Self-pay

## 2016-09-03 LAB — CBC WITH DIFFERENTIAL/PLATELET
BASOS ABS: 0 10*3/uL (ref 0.0–0.1)
BASOS PCT: 0.6 % (ref 0.0–3.0)
EOS ABS: 0.1 10*3/uL (ref 0.0–0.7)
Eosinophils Relative: 3.4 % (ref 0.0–5.0)
HEMATOCRIT: 36 % (ref 36.0–46.0)
HEMOGLOBIN: 11.8 g/dL — AB (ref 12.0–15.0)
LYMPHS PCT: 41.6 % (ref 12.0–46.0)
Lymphs Abs: 0.8 10*3/uL (ref 0.7–4.0)
MCHC: 32.8 g/dL (ref 30.0–36.0)
MCV: 85.1 fl (ref 78.0–100.0)
MONOS PCT: 9.5 % (ref 3.0–12.0)
Monocytes Absolute: 0.2 10*3/uL (ref 0.1–1.0)
Neutro Abs: 0.9 10*3/uL — ABNORMAL LOW (ref 1.4–7.7)
Neutrophils Relative %: 44.9 % (ref 43.0–77.0)
Platelets: 109 10*3/uL — ABNORMAL LOW (ref 150.0–400.0)
RBC: 4.23 Mil/uL (ref 3.87–5.11)
RDW: 13.6 % (ref 11.5–15.5)
WBC: 2 10*3/uL — ABNORMAL LOW (ref 4.0–10.5)

## 2016-09-03 LAB — COMPREHENSIVE METABOLIC PANEL
ALK PHOS: 37 U/L — AB (ref 39–117)
ALT: 20 U/L (ref 0–35)
AST: 18 U/L (ref 0–37)
Albumin: 4.2 g/dL (ref 3.5–5.2)
BILIRUBIN TOTAL: 0.4 mg/dL (ref 0.2–1.2)
BUN: 10 mg/dL (ref 6–23)
CALCIUM: 9.2 mg/dL (ref 8.4–10.5)
CO2: 32 meq/L (ref 19–32)
CREATININE: 0.88 mg/dL (ref 0.40–1.20)
Chloride: 103 mEq/L (ref 96–112)
GFR: 85.17 mL/min (ref >60.00)
Glucose, Bld: 78 mg/dL (ref 70–99)
Potassium: 3.8 mEq/L (ref 3.5–5.1)
Sodium: 138 mEq/L (ref 135–145)
TOTAL PROTEIN: 8.1 g/dL (ref 6.0–8.3)

## 2016-09-03 LAB — LIPID PANEL
CHOL/HDL RATIO: 3
Cholesterol: 167 mg/dL (ref 0–200)
HDL: 58.6 mg/dL (ref >39.00)
LDL Cholesterol: 97 mg/dL (ref 0–99)
NONHDL: 108.2
TRIGLYCERIDES: 58 mg/dL (ref 0.0–149.0)
VLDL: 11.6 mg/dL (ref 0.0–40.0)

## 2016-09-03 LAB — TSH: TSH: 0.7 u[IU]/mL (ref 0.35–4.50)

## 2016-09-03 NOTE — Telephone Encounter (Signed)
Pt was seen by Dr. Etter Sjogren yesterday (09/02/16) for Annual exam.  Per provider's note, pt doing well clinically.  Spoke to Dr. Larose Kells regarding critical lab value.  Dr. Larose Kells advised that this is a chronic issue for patient, okay to wait for PCP.    Messages routed to PCP for FYI.

## 2016-09-03 NOTE — Telephone Encounter (Signed)
Santiago Glad from Lab called to report the following critical lab value:  WBC: 2.0   Dr. Etter Sjogren is currently out of the office.  Please advise in her absence.

## 2016-09-04 NOTE — Telephone Encounter (Signed)
Stable for pt with no symptoms Pt had been seen by hematology in past

## 2016-09-05 ENCOUNTER — Other Ambulatory Visit (INDEPENDENT_AMBULATORY_CARE_PROVIDER_SITE_OTHER): Payer: 59

## 2016-09-05 DIAGNOSIS — Z0000 Encounter for general adult medical examination without abnormal findings: Secondary | ICD-10-CM | POA: Diagnosis not present

## 2016-09-05 LAB — HEMOGLOBIN A1C: HEMOGLOBIN A1C: 5.3 % (ref 4.6–6.5)

## 2017-09-01 ENCOUNTER — Telehealth: Payer: Self-pay | Admitting: Family Medicine

## 2017-09-01 NOTE — Telephone Encounter (Signed)
Copied from [place]. Topic: Quick Communication - Other Results >> Sep 01, 2017 12:31 PM Rosalin Hawking wrote: Pt never picked up Physician Results Form that was filled out by provider since 09-02-2016. Documents mailed to pt.

## 2017-11-17 ENCOUNTER — Encounter: Payer: Self-pay | Admitting: Family Medicine

## 2017-11-17 ENCOUNTER — Ambulatory Visit (INDEPENDENT_AMBULATORY_CARE_PROVIDER_SITE_OTHER): Payer: Self-pay | Admitting: Family Medicine

## 2017-11-17 VITALS — BP 130/60 | HR 62 | Temp 98.3°F | Resp 16 | Ht 65.0 in | Wt 157.6 lb

## 2017-11-17 DIAGNOSIS — G500 Trigeminal neuralgia: Secondary | ICD-10-CM

## 2017-11-17 DIAGNOSIS — Z0000 Encounter for general adult medical examination without abnormal findings: Secondary | ICD-10-CM

## 2017-11-17 MED ORDER — PREGABALIN 100 MG PO CAPS: 100.0000 mg | ORAL_CAPSULE | Freq: Two times a day (BID) | ORAL | 3 refills | Status: DC

## 2017-11-17 NOTE — Progress Notes (Signed)
Subjective:     Jackie Casey is a 57 y.o. female and is here for a comprehensive physical exam. The patient reports no problems.  Social History   Socioeconomic History  . Marital status: Single    Spouse name: Not on file  . Number of children: Not on file  . Years of education: Not on file  . Highest education level: Not on file  Occupational History  . Occupation: ADMINISTRATIVE    Employer: [employer]    Comment: Engineer, structural  Social Needs  . Financial resource strain: Not on file  . Food insecurity:    Worry: Not on file    Inability: Not on file  . Transportation needs:    Medical: Not on file    Non-medical: Not on file  Tobacco Use  . Smoking status: Never Smoker  . Smokeless tobacco: Never Used  . Tobacco comment: NEVER USED TOBACCO  Substance and Sexual Activity  . Alcohol use: Yes    Alcohol/week: 0.0 oz    Comment: rare--  1-2 x a month  . Drug use: No  . Sexual activity: Yes    Partners: Male    Birth control/protection: None  Lifestyle  . Physical activity:    Days per week: Not on file    Minutes per session: Not on file  . Stress: Not on file  Relationships  . Social connections:    Talks on phone: Not on file    Gets together: Not on file    Attends religious service: Not on file    Active member of club or organization: Not on file    Attends meetings of clubs or organizations: Not on file    Relationship status: Not on file  . Intimate partner violence:    Fear of current or ex partner: Not on file    Emotionally abused: Not on file    Physically abused: Not on file    Forced sexual activity: Not on file  Other Topics Concern  . Not on file  Social History Narrative   Exercise-- crunches, jumping jacks etc, twice a week   Health Maintenance  Topic Date Due  . MAMMOGRAM  11/28/2017  . INFLUENZA VACCINE  03/11/2018  . PAP SMEAR  11/22/2018  . COLONOSCOPY  12/25/2019  . TETANUS/TDAP  12/01/2020  . Hepatitis C Screening   Completed  . HIV Screening  Completed    The following portions of the patient's history were reviewed and updated as appropriate:  She  has a past medical history of Depression and Trigeminal neuralgia. She does not have any pertinent problems on file. She  has a past surgical history that includes Tonsillectomy. Her family history includes Asthma in her mother; Hypertension in her mother; Stroke in her mother. She  reports that she has never smoked. She has never used smokeless tobacco. She reports that she drinks alcohol. She reports that she does not use drugs. She has a current medication list which includes the following prescription(s): aspirin, biotin w/ vitamins c & e, black cohosh, multivitamin, and pregabalin. Current Outpatient Medications on File Prior to Visit  Medication Sig Dispense Refill  . aspirin 81 MG EC tablet Take 81 mg by mouth. Two times weekly    . Biotin w/ Vitamins C & E (HAIR/SKIN/NAILS PO) Take 1 tablet by mouth daily.    . Black Cohosh 40 MG CAPS Take 2 capsules by mouth daily.    . Multiple Vitamin (MULTIVITAMIN) tablet Take 1 tablet by  mouth daily.       No current facility-administered medications on file prior to visit.    She has No Known Allergies..  Review of Systems Review of Systems  Constitutional: Negative for activity change, appetite change and fatigue.  HENT: Negative for hearing loss, congestion, tinnitus and ear discharge.  dentist q17m Eyes: Negative for visual disturbance (see optho q1y -- vision corrected to 20/20 with glasses).  Respiratory: Negative for cough, chest tightness and shortness of breath.   Cardiovascular: Negative for chest pain, palpitations and leg swelling.  Gastrointestinal: Negative for abdominal pain, diarrhea, constipation and abdominal distention.  Genitourinary: Negative for urgency, frequency, decreased urine volume and difficulty urinating.  Musculoskeletal: Negative for back pain, arthralgias and gait problem.   Skin: Negative for color change, pallor and rash.  Neurological: Negative for dizziness, light-headedness, numbness and headaches.  Hematological: Negative for adenopathy. Does not bruise/bleed easily.  Psychiatric/Behavioral: Negative for suicidal ideas, confusion, sleep disturbance, self-injury, dysphoric mood, decreased concentration and agitation.       Objective:    BP 130/60 (BP Location: Left Arm, Cuff Size: Normal)   Pulse 62   Temp 98.3 F (36.8 C) (Oral)   Resp 16   Ht 5\' 5"  (1.651 m)   Wt 157 lb 9.6 oz (71.5 kg)   LMP 04/01/2016   SpO2 98%   BMI 26.23 kg/m  General appearance: alert, cooperative, appears stated age and no distress Head: Normocephalic, without obvious abnormality, atraumatic Eyes: conjunctivae/corneas clear. PERRL, EOM's intact. Fundi benign. Ears: normal TM's and external ear canals both ears Nose: Nares normal. Septum midline. Mucosa normal. No drainage or sinus tenderness. Throat: lips, mucosa, and tongue normal; teeth and gums normal Neck: no adenopathy, no carotid bruit, no JVD, supple, symmetrical, trachea midline and thyroid not enlarged, symmetric, no tenderness/mass/nodules Back: symmetric, no curvature. ROM normal. No CVA tenderness. Lungs: clear to auscultation bilaterally Breasts: normal appearance, no masses or tenderness Heart: regular rate and rhythm, S1, S2 normal, no murmur, click, rub or gallop Abdomen: soft, non-tender; bowel sounds normal; no masses,  no organomegaly Pelvic: deferred Extremities: extremities normal, atraumatic, no cyanosis or edema Pulses: 2+ and symmetric Skin: Skin color, texture, turgor normal. No rashes or lesions Lymph nodes: Cervical, supraclavicular, and axillary nodes normal. Neurologic: Alert and oriented X 3, normal strength and tone. Normal symmetric reflexes. Normal coordination and gait    Assessment:    Healthy female exam.      Plan:    ghm utd Check labs  See After Visit Summary for  Counseling Recommendations    1. Preventative health care See above See AVS - CBC with Differential/Platelet - Comprehensive metabolic panel - Lipid panel - TSH  2. Trigeminal neuralgia   - pregabalin (LYRICA) 100 MG capsule; Take 1 capsule (100 mg total) by mouth 2 (two) times daily.  Dispense: 180 capsule; Refill: 3

## 2017-11-17 NOTE — Patient Instructions (Signed)
Preventive Care 40-64 Years, Female Preventive care refers to lifestyle choices and visits with your health care provider that can promote health and wellness. What does preventive care include?  A yearly physical exam. This is also called an annual well check.  Dental exams once or twice a year.  Routine eye exams. Ask your health care provider how often you should have your eyes checked.  Personal lifestyle choices, including: ? Daily care of your teeth and gums. ? Regular physical activity. ? Eating a healthy diet. ? Avoiding tobacco and drug use. ? Limiting alcohol use. ? Practicing safe sex. ? Taking low-dose aspirin daily starting at age 58. ? Taking vitamin and mineral supplements as recommended by your health care provider. What happens during an annual well check? The services and screenings done by your health care provider during your annual well check will depend on your age, overall health, lifestyle risk factors, and family history of disease. Counseling Your health care provider may ask you questions about your:  Alcohol use.  Tobacco use.  Drug use.  Emotional well-being.  Home and relationship well-being.  Sexual activity.  Eating habits.  Work and work Statistician.  Method of birth control.  Menstrual cycle.  Pregnancy history.  Screening You may have the following tests or measurements:  Height, weight, and BMI.  Blood pressure.  Lipid and cholesterol levels. These may be checked every 5 years, or more frequently if you are over 81 years old.  Skin check.  Lung cancer screening. You may have this screening every year starting at age 78 if you have a 30-pack-year history of smoking and currently smoke or have quit within the past 15 years.  Fecal occult blood test (FOBT) of the stool. You may have this test every year starting at age 65.  Flexible sigmoidoscopy or colonoscopy. You may have a sigmoidoscopy every 5 years or a colonoscopy  every 10 years starting at age 30.  Hepatitis C blood test.  Hepatitis B blood test.  Sexually transmitted disease (STD) testing.  Diabetes screening. This is done by checking your blood sugar (glucose) after you have not eaten for a while (fasting). You may have this done every 1-3 years.  Mammogram. This may be done every 1-2 years. Talk to your health care provider about when you should start having regular mammograms. This may depend on whether you have a family history of breast cancer.  BRCA-related cancer screening. This may be done if you have a family history of breast, ovarian, tubal, or peritoneal cancers.  Pelvic exam and Pap test. This may be done every 3 years starting at age 80. Starting at age 36, this may be done every 5 years if you have a Pap test in combination with an HPV test.  Bone density scan. This is done to screen for osteoporosis. You may have this scan if you are at high risk for osteoporosis.  Discuss your test results, treatment options, and if necessary, the need for more tests with your health care provider. Vaccines Your health care provider may recommend certain vaccines, such as:  Influenza vaccine. This is recommended every year.  Tetanus, diphtheria, and acellular pertussis (Tdap, Td) vaccine. You may need a Td booster every 10 years.  Varicella vaccine. You may need this if you have not been vaccinated.  Zoster vaccine. You may need this after age 5.  Measles, mumps, and rubella (MMR) vaccine. You may need at least one dose of MMR if you were born in  1957 or later. You may also need a second dose.  Pneumococcal 13-valent conjugate (PCV13) vaccine. You may need this if you have certain conditions and were not previously vaccinated.  Pneumococcal polysaccharide (PPSV23) vaccine. You may need one or two doses if you smoke cigarettes or if you have certain conditions.  Meningococcal vaccine. You may need this if you have certain  conditions.  Hepatitis A vaccine. You may need this if you have certain conditions or if you travel or work in places where you may be exposed to hepatitis A.  Hepatitis B vaccine. You may need this if you have certain conditions or if you travel or work in places where you may be exposed to hepatitis B.  Haemophilus influenzae type b (Hib) vaccine. You may need this if you have certain conditions.  Talk to your health care provider about which screenings and vaccines you need and how often you need them. This information is not intended to replace advice given to you by your health care provider. Make sure you discuss any questions you have with your health care provider. Document Released: 08/24/2015 Document Revised: 04/16/2016 Document Reviewed: 05/29/2015 Elsevier Interactive Patient Education  2018 Elsevier Inc.  

## 2017-11-18 LAB — COMPREHENSIVE METABOLIC PANEL
ALBUMIN: 4.1 g/dL (ref 3.5–5.2)
ALT: 17 U/L (ref 0–35)
AST: 19 U/L (ref 0–37)
Alkaline Phosphatase: 50 U/L (ref 39–117)
BILIRUBIN TOTAL: 0.5 mg/dL (ref 0.2–1.2)
BUN: 16 mg/dL (ref 6–23)
CALCIUM: 9.1 mg/dL (ref 8.4–10.5)
CO2: 30 mEq/L (ref 19–32)
CREATININE: 0.98 mg/dL (ref 0.40–1.20)
Chloride: 103 mEq/L (ref 96–112)
GFR: 74.9 mL/min (ref >60.00)
Glucose, Bld: 79 mg/dL (ref 70–99)
Potassium: 3.7 mEq/L (ref 3.5–5.1)
Sodium: 139 mEq/L (ref 135–145)
Total Protein: 7.7 g/dL (ref 6.0–8.3)

## 2017-11-18 LAB — CBC WITH DIFFERENTIAL/PLATELET
BASOS ABS: 0 10*3/uL (ref 0.0–0.1)
BASOS PCT: 0.3 % (ref 0.0–3.0)
EOS ABS: 0 10*3/uL (ref 0.0–0.7)
Eosinophils Relative: 1.8 % (ref 0.0–5.0)
HEMATOCRIT: 35.2 % — AB (ref 36.0–46.0)
HEMOGLOBIN: 11.4 g/dL — AB (ref 12.0–15.0)
LYMPHS PCT: 34.4 % (ref 12.0–46.0)
Lymphs Abs: 0.9 10*3/uL (ref 0.7–4.0)
MCHC: 32.3 g/dL (ref 30.0–36.0)
MCV: 85.6 fl (ref 78.0–100.0)
MONOS PCT: 10.5 % (ref 3.0–12.0)
Monocytes Absolute: 0.3 10*3/uL (ref 0.1–1.0)
NEUTROS ABS: 1.3 10*3/uL — AB (ref 1.4–7.7)
Neutrophils Relative %: 53 % (ref 43.0–77.0)
PLATELETS: 107 10*3/uL — AB (ref 150.0–400.0)
RBC: 4.11 Mil/uL (ref 3.87–5.11)
RDW: 13.9 % (ref 11.5–15.5)
WBC: 2.5 10*3/uL — AB (ref 4.0–10.5)

## 2017-11-18 LAB — LIPID PANEL
CHOLESTEROL: 161 mg/dL (ref 0–200)
HDL: 56.8 mg/dL (ref >39.00)
LDL Cholesterol: 90 mg/dL (ref 0–99)
NonHDL: 104
TRIGLYCERIDES: 68 mg/dL (ref 0.0–149.0)
Total CHOL/HDL Ratio: 3
VLDL: 13.6 mg/dL (ref 0.0–40.0)

## 2017-11-18 LAB — TSH: TSH: 0.27 u[IU]/mL — ABNORMAL LOW (ref 0.35–4.50)

## 2017-11-23 ENCOUNTER — Other Ambulatory Visit: Payer: Self-pay | Admitting: *Deleted

## 2017-11-23 DIAGNOSIS — E039 Hypothyroidism, unspecified: Secondary | ICD-10-CM

## 2018-02-16 ENCOUNTER — Other Ambulatory Visit (INDEPENDENT_AMBULATORY_CARE_PROVIDER_SITE_OTHER): Payer: Self-pay

## 2018-02-16 DIAGNOSIS — E039 Hypothyroidism, unspecified: Secondary | ICD-10-CM

## 2018-02-16 LAB — TSH: TSH: 0.92 u[IU]/mL (ref 0.35–4.50)

## 2018-05-21 ENCOUNTER — Telehealth: Payer: Self-pay | Admitting: *Deleted

## 2018-05-21 NOTE — Telephone Encounter (Signed)
[facility] faxed over stated that they need a new prescription for Lyrica.  It states that we can send electronically to [facility] pharmacy.

## 2018-05-23 ENCOUNTER — Other Ambulatory Visit: Payer: Self-pay | Admitting: Family Medicine

## 2018-05-23 DIAGNOSIS — G500 Trigeminal neuralgia: Secondary | ICD-10-CM

## 2018-05-23 MED ORDER — PREGABALIN 100 MG PO CAPS: 100.0000 mg | ORAL_CAPSULE | Freq: Two times a day (BID) | ORAL | 3 refills | Status: DC

## 2018-08-02 LAB — HM MAMMOGRAPHY

## 2018-08-05 ENCOUNTER — Telehealth: Payer: Self-pay | Admitting: *Deleted

## 2018-08-05 NOTE — Telephone Encounter (Signed)
Received Mammogram results from [place]; forwarded to provider/SLS 12/26

## 2018-08-26 ENCOUNTER — Encounter: Payer: Self-pay | Admitting: Family Medicine

## 2018-11-05 ENCOUNTER — Encounter: Payer: Self-pay | Admitting: Family Medicine

## 2018-11-15 ENCOUNTER — Other Ambulatory Visit: Payer: Self-pay

## 2018-11-15 ENCOUNTER — Encounter: Payer: Self-pay | Admitting: Family Medicine

## 2018-11-15 ENCOUNTER — Ambulatory Visit (INDEPENDENT_AMBULATORY_CARE_PROVIDER_SITE_OTHER): Payer: BC Managed Care – PPO | Admitting: Family Medicine

## 2018-11-15 DIAGNOSIS — G500 Trigeminal neuralgia: Secondary | ICD-10-CM

## 2018-11-15 MED ORDER — PREGABALIN 100 MG PO CAPS: 100.0000 mg | ORAL_CAPSULE | Freq: Two times a day (BID) | ORAL | 3 refills | Status: DC

## 2018-11-15 NOTE — Progress Notes (Signed)
Virtual Visit via Video Note  I connected with Calais Capron on 11/15/18 at 10:00 AM EDT by a video enabled telemedicine application and verified that I am speaking with the correct person using two identifiers.   I discussed the limitations of evaluation and management by telemedicine and the availability of in person appointments. The patient expressed understanding and agreed to proceed.  History of Present Illness: Pt is at at work preparing meals for first responders.  She needs a refill of her lyrica and normally gets it from [facility].  No other complaints.  No fever, no congestion or sob.     Observations/Objective: Afebrile,  Pt NAD No vs due to covid 19 and visit is virtual   Assessment and Plan: 1. Trigeminal neuralgia Stable-- refill meds  - pregabalin (LYRICA) 100 MG capsule; Take 1 capsule (100 mg total) by mouth 2 (two) times daily.  Dispense: 60 capsule; Refill: 3   Follow Up Instructions:    I discussed the assessment and treatment plan with the patient. The patient was provided an opportunity to ask questions and all were answered. The patient agreed with the plan and demonstrated an understanding of the instructions.   The patient was advised to call back or seek an in-person evaluation if the symptoms worsen or if the condition fails to improve as anticipate   [name], DO

## 2018-11-16 ENCOUNTER — Telehealth: Payer: Self-pay | Admitting: Family Medicine

## 2018-11-16 ENCOUNTER — Other Ambulatory Visit: Payer: Self-pay | Admitting: *Deleted

## 2018-11-16 DIAGNOSIS — G500 Trigeminal neuralgia: Secondary | ICD-10-CM

## 2018-11-16 MED ORDER — PREGABALIN 100 MG PO CAPS: 100.0000 mg | ORAL_CAPSULE | Freq: Two times a day (BID) | ORAL | 1 refills | Status: DC

## 2018-11-16 NOTE — Telephone Encounter (Signed)
Patient notified that paperwork was received and has been sent in.

## 2018-11-16 NOTE — Telephone Encounter (Signed)
Copied from [place] [id number]. Topic: Quick Communication - Rx Refill/Question >> Nov 16, 2018  8:11 AM Margot Ables wrote: Medication: lyrica - pt said that she completed and faxed in form last night to [phone] - she is wanting call from [facility] to make sure it was received. Call back # [phone]

## 2019-02-04 ENCOUNTER — Encounter: Payer: Self-pay | Admitting: Family Medicine

## 2019-07-01 ENCOUNTER — Other Ambulatory Visit: Payer: Self-pay

## 2019-07-04 ENCOUNTER — Ambulatory Visit (INDEPENDENT_AMBULATORY_CARE_PROVIDER_SITE_OTHER): Payer: BC Managed Care – PPO | Admitting: Family Medicine

## 2019-07-04 ENCOUNTER — Other Ambulatory Visit: Payer: Self-pay

## 2019-07-04 ENCOUNTER — Encounter: Payer: Self-pay | Admitting: Family Medicine

## 2019-07-04 VITALS — Ht 65.0 in | Wt 160.0 lb

## 2019-07-04 DIAGNOSIS — Z8639 Personal history of other endocrine, nutritional and metabolic disease: Secondary | ICD-10-CM | POA: Diagnosis not present

## 2019-07-04 DIAGNOSIS — J029 Acute pharyngitis, unspecified: Secondary | ICD-10-CM | POA: Diagnosis not present

## 2019-07-04 DIAGNOSIS — Z136 Encounter for screening for cardiovascular disorders: Secondary | ICD-10-CM | POA: Diagnosis not present

## 2019-07-04 DIAGNOSIS — G500 Trigeminal neuralgia: Secondary | ICD-10-CM | POA: Diagnosis not present

## 2019-07-04 MED ORDER — PREGABALIN 100 MG PO CAPS: 100.0000 mg | ORAL_CAPSULE | Freq: Two times a day (BID) | ORAL | 1 refills | Status: DC

## 2019-07-04 NOTE — Progress Notes (Signed)
Virtual Visit via Video Note  I connected with Jackie Casey on 07/04/19 at 10:20 AM EST by a video enabled telemedicine application and verified that I am speaking with the correct person using two identifiers.  Location: Patient: home alone Provider: office    I discussed the limitations of evaluation and management by telemedicine and the availability of in person appointments. The patient expressed understanding and agreed to proceed.  History of Present Illness: Pt is home c/o dry scratchy throat --- it is not sore but burns   She is concerned it is her thyroid No trouble swallowing  No fever, no congestion    Observations/Objective: No vitals obtained--- she checks her temp daily and she has had no fever Pt in NAD  Assessment and Plan: 1. Trigeminal neuralgia Refill meds -- - stable - pregabalin (LYRICA) 100 MG capsule; Take 1 capsule (100 mg total) by mouth 2 (two) times daily.  Dispense: 180 capsule; Refill: 1  2. History of thyroid disease Recheck labs  - Thyroid Panel With TSH; Future  3. Pharyngitis, unspecified etiology Check labs  Pt has not been exposed to covid  - CBC with Differential; Future - Comprehensive metabolic panel; Future - Thyroid Panel With TSH; Future  4. Ischemic heart disease screen Check labs -- rto cpe  - Lipid panel; Future - Comprehensive metabolic panel; Future   Follow Up Instructions:    I discussed the assessment and treatment plan with the patient. The patient was provided an opportunity to ask questions and all were answered. The patient agreed with the plan and demonstrated an understanding of the instructions.   The patient was advised to call back or seek an in-person evaluation if the symptoms worsen or if the condition fails to improve as anticipated.  I provided 15 minutes of non-face-to-face time during this encounter.   [name], DO

## 2019-07-12 ENCOUNTER — Other Ambulatory Visit: Payer: Self-pay

## 2019-07-12 ENCOUNTER — Other Ambulatory Visit (INDEPENDENT_AMBULATORY_CARE_PROVIDER_SITE_OTHER): Payer: BC Managed Care – PPO

## 2019-07-12 DIAGNOSIS — Z8639 Personal history of other endocrine, nutritional and metabolic disease: Secondary | ICD-10-CM | POA: Diagnosis not present

## 2019-07-12 DIAGNOSIS — J029 Acute pharyngitis, unspecified: Secondary | ICD-10-CM

## 2019-07-12 DIAGNOSIS — Z136 Encounter for screening for cardiovascular disorders: Secondary | ICD-10-CM

## 2019-07-12 LAB — COMPREHENSIVE METABOLIC PANEL
ALT: 19 U/L (ref 0–35)
AST: 19 U/L (ref 0–37)
Albumin: 4.2 g/dL (ref 3.5–5.2)
Alkaline Phosphatase: 53 U/L (ref 39–117)
BUN: 16 mg/dL (ref 6–23)
CO2: 30 mEq/L (ref 19–32)
Calcium: 9.2 mg/dL (ref 8.4–10.5)
Chloride: 102 mEq/L (ref 96–112)
Creatinine, Ser: 0.93 mg/dL (ref 0.40–1.20)
GFR: 74.44 mL/min (ref >60.00)
Glucose, Bld: 85 mg/dL (ref 70–99)
Potassium: 4 mEq/L (ref 3.5–5.1)
Sodium: 139 mEq/L (ref 135–145)
Total Bilirubin: 0.4 mg/dL (ref 0.2–1.2)
Total Protein: 7.6 g/dL (ref 6.0–8.3)

## 2019-07-12 LAB — LIPID PANEL
Cholesterol: 175 mg/dL (ref 0–200)
HDL: 53.6 mg/dL (ref >39.00)
LDL Cholesterol: 110 mg/dL — ABNORMAL HIGH (ref 0–99)
NonHDL: 120.96
Total CHOL/HDL Ratio: 3
Triglycerides: 55 mg/dL (ref 0.0–149.0)
VLDL: 11 mg/dL (ref 0.0–40.0)

## 2019-07-12 LAB — CBC WITH DIFFERENTIAL/PLATELET
Basophils Absolute: 0 10*3/uL (ref 0.0–0.1)
Basophils Relative: 0.8 % (ref 0.0–3.0)
Eosinophils Absolute: 0 10*3/uL (ref 0.0–0.7)
Eosinophils Relative: 1.6 % (ref 0.0–5.0)
HCT: 36.6 % (ref 36.0–46.0)
Hemoglobin: 11.9 g/dL — ABNORMAL LOW (ref 12.0–15.0)
Lymphocytes Relative: 35.7 % (ref 12.0–46.0)
Lymphs Abs: 0.8 10*3/uL (ref 0.7–4.0)
MCHC: 32.4 g/dL (ref 30.0–36.0)
MCV: 85.2 fl (ref 78.0–100.0)
Monocytes Absolute: 0.2 10*3/uL (ref 0.1–1.0)
Monocytes Relative: 10.4 % (ref 3.0–12.0)
Neutro Abs: 1.1 10*3/uL — ABNORMAL LOW (ref 1.4–7.7)
Neutrophils Relative %: 51.5 % (ref 43.0–77.0)
Platelets: 115 10*3/uL — ABNORMAL LOW (ref 150.0–400.0)
RBC: 4.3 Mil/uL (ref 3.87–5.11)
RDW: 13.3 % (ref 11.5–15.5)
WBC: 2.1 10*3/uL — ABNORMAL LOW (ref 4.0–10.5)

## 2019-07-13 LAB — THYROID PANEL WITH TSH
Free Thyroxine Index: 2.3 (ref 1.4–3.8)
T3 Uptake: 30 % (ref 22–35)
T4, Total: 7.5 ug/dL (ref 5.1–11.9)
TSH: 0.54 mIU/L (ref 0.40–4.50)

## 2019-08-15 LAB — HM MAMMOGRAPHY

## 2019-08-25 ENCOUNTER — Other Ambulatory Visit: Payer: Self-pay | Admitting: Family Medicine

## 2019-08-26 ENCOUNTER — Other Ambulatory Visit: Payer: Self-pay

## 2019-08-29 ENCOUNTER — Ambulatory Visit (INDEPENDENT_AMBULATORY_CARE_PROVIDER_SITE_OTHER): Payer: BC Managed Care – PPO | Admitting: Family Medicine

## 2019-08-29 ENCOUNTER — Other Ambulatory Visit (HOSPITAL_COMMUNITY)
Admission: RE | Admit: 2019-08-29 | Discharge: 2019-08-29 | Disposition: A | Discharge status: Home / Self Care | Payer: BC Managed Care – PPO | Source: Ambulatory Visit | Attending: Family Medicine | Admitting: Family Medicine

## 2019-08-29 ENCOUNTER — Encounter: Payer: Self-pay | Admitting: Family Medicine

## 2019-08-29 ENCOUNTER — Other Ambulatory Visit: Payer: Self-pay

## 2019-08-29 VITALS — BP 122/80 | HR 72 | Temp 97.5°F | Resp 18 | Ht 65.0 in | Wt 160.8 lb

## 2019-08-29 DIAGNOSIS — G500 Trigeminal neuralgia: Secondary | ICD-10-CM | POA: Diagnosis not present

## 2019-08-29 DIAGNOSIS — Z23 Encounter for immunization: Secondary | ICD-10-CM | POA: Diagnosis not present

## 2019-08-29 DIAGNOSIS — Z0000 Encounter for general adult medical examination without abnormal findings: Secondary | ICD-10-CM | POA: Insufficient documentation

## 2019-08-29 DIAGNOSIS — R0789 Other chest pain: Secondary | ICD-10-CM

## 2019-08-29 LAB — CBC WITH DIFFERENTIAL/PLATELET
Basophils Absolute: 0 10*3/uL (ref 0.0–0.1)
Basophils Relative: 1.1 % (ref 0.0–3.0)
Eosinophils Absolute: 0.1 10*3/uL (ref 0.0–0.7)
Eosinophils Relative: 3 % (ref 0.0–5.0)
HCT: 36.2 % (ref 36.0–46.0)
Hemoglobin: 11.7 g/dL — ABNORMAL LOW (ref 12.0–15.0)
Lymphocytes Relative: 30.6 % (ref 12.0–46.0)
Lymphs Abs: 0.7 10*3/uL (ref 0.7–4.0)
MCHC: 32.2 g/dL (ref 30.0–36.0)
MCV: 85.1 fl (ref 78.0–100.0)
Monocytes Absolute: 0.3 10*3/uL (ref 0.1–1.0)
Monocytes Relative: 11.3 % (ref 3.0–12.0)
Neutro Abs: 1.3 10*3/uL — ABNORMAL LOW (ref 1.4–7.7)
Neutrophils Relative %: 54 % (ref 43.0–77.0)
Platelets: 102 10*3/uL — ABNORMAL LOW (ref 150.0–400.0)
RBC: 4.25 Mil/uL (ref 3.87–5.11)
RDW: 13.4 % (ref 11.5–15.5)
WBC: 2.4 10*3/uL — ABNORMAL LOW (ref 4.0–10.5)

## 2019-08-29 LAB — TSH: TSH: 0.5 u[IU]/mL (ref 0.35–4.50)

## 2019-08-29 LAB — COMPREHENSIVE METABOLIC PANEL
ALT: 16 U/L (ref 0–35)
AST: 16 U/L (ref 0–37)
Albumin: 4.2 g/dL (ref 3.5–5.2)
Alkaline Phosphatase: 51 U/L (ref 39–117)
BUN: 22 mg/dL (ref 6–23)
CO2: 29 mEq/L (ref 19–32)
Calcium: 9.3 mg/dL (ref 8.4–10.5)
Chloride: 105 mEq/L (ref 96–112)
Creatinine, Ser: 0.91 mg/dL (ref 0.40–1.20)
GFR: 76.3 mL/min (ref >60.00)
Glucose, Bld: 84 mg/dL (ref 70–99)
Potassium: 4 mEq/L (ref 3.5–5.1)
Sodium: 139 mEq/L (ref 135–145)
Total Bilirubin: 0.3 mg/dL (ref 0.2–1.2)
Total Protein: 7.6 g/dL (ref 6.0–8.3)

## 2019-08-29 LAB — LIPID PANEL
Cholesterol: 169 mg/dL (ref 0–200)
HDL: 56.7 mg/dL (ref >39.00)
LDL Cholesterol: 103 mg/dL — ABNORMAL HIGH (ref 0–99)
NonHDL: 112.12
Total CHOL/HDL Ratio: 3
Triglycerides: 45 mg/dL (ref 0.0–149.0)
VLDL: 9 mg/dL (ref 0.0–40.0)

## 2019-08-29 MED ORDER — PREGABALIN 100 MG PO CAPS: 100.0000 mg | ORAL_CAPSULE | Freq: Two times a day (BID) | ORAL | 1 refills | Status: DC

## 2019-08-29 NOTE — Assessment & Plan Note (Signed)
ghm utd Check labs See AVS 

## 2019-08-29 NOTE — Assessment & Plan Note (Addendum)
Stable-- refill lyrica

## 2019-08-29 NOTE — Assessment & Plan Note (Signed)
Check echo Refer to cardiology 

## 2019-08-29 NOTE — Patient Instructions (Addendum)
COVID-19 Vaccine Information can be found at: [email or web address] For questions related to vaccine distribution or appointments, please email [email or web address] or call [phone].       Preventive Care 60-60 Years Old, Female Preventive care refers to visits with your health care provider and lifestyle choices that can promote health and wellness. This includes:  A yearly physical exam. This may also be called an annual well check.  Regular dental visits and eye exams.  Immunizations.  Screening for certain conditions.  Healthy lifestyle choices, such as eating a healthy diet, getting regular exercise, not using drugs or products that contain nicotine and tobacco, and limiting alcohol use. What can I expect for my preventive care visit? Physical exam Your health care provider will check your:  Height and weight. This may be used to calculate body mass index (BMI), which tells if you are at a healthy weight.  Heart rate and blood pressure.  Skin for abnormal spots. Counseling Your health care provider may ask you questions about your:  Alcohol, tobacco, and drug use.  Emotional well-being.  Home and relationship well-being.  Sexual activity.  Eating habits.  Work and work environment.  Method of birth control.  Menstrual cycle.  Pregnancy history. What immunizations do I need?  Influenza (flu) vaccine  This is recommended every year. Tetanus, diphtheria, and pertussis (Tdap) vaccine  You may need a Td booster every 10 years. Varicella (chickenpox) vaccine  You may need this if you have not been vaccinated. Zoster (shingles) vaccine  You may need this after age 60. Measles, mumps, and rubella (MMR) vaccine  You may need at least one dose of MMR if you were born in 1957 or later. You may also need a second dose. Pneumococcal conjugate (PCV13) vaccine  You may need this if you have certain  conditions and were not previously vaccinated. Pneumococcal polysaccharide (PPSV23) vaccine  You may need one or two doses if you smoke cigarettes or if you have certain conditions. Meningococcal conjugate (MenACWY) vaccine  You may need this if you have certain conditions. Hepatitis A vaccine  You may need this if you have certain conditions or if you travel or work in places where you may be exposed to hepatitis A. Hepatitis B vaccine  You may need this if you have certain conditions or if you travel or work in places where you may be exposed to hepatitis B. Haemophilus influenzae type b (Hib) vaccine  You may need this if you have certain conditions. Human papillomavirus (HPV) vaccine  If recommended by your health care provider, you may need three doses over 6 months. You may receive vaccines as individual doses or as more than one vaccine together in one shot (combination vaccines). Talk with your health care provider about the risks and benefits of combination vaccines. What tests do I need? Blood tests  Lipid and cholesterol levels. These may be checked every 5 years, or more frequently if you are over 50 years old.  Hepatitis C test.  Hepatitis B test. Screening  Lung cancer screening. You may have this screening every year starting at age 55 if you have a 30-pack-year history of smoking and currently smoke or have quit within the past 15 years.  Colorectal cancer screening. All adults should have this screening starting at age 50 and continuing until age 75. Your health care provider may recommend screening at age 45 if you are at increased risk. You will have tests every 1-10 years, depending on your results and the type   of screening test.  Diabetes screening. This is done by checking your blood sugar (glucose) after you have not eaten for a while (fasting). You may have this done every 1-3 years.  Mammogram. This may be done every 1-2 years. Talk with your health care  provider about when you should start having regular mammograms. This may depend on whether you have a family history of breast cancer.  BRCA-related cancer screening. This may be done if you have a family history of breast, ovarian, tubal, or peritoneal cancers.  Pelvic exam and Pap test. This may be done every 3 years starting at age 21. Starting at age 30, this may be done every 5 years if you have a Pap test in combination with an HPV test. Other tests  Sexually transmitted disease (STD) testing.  Bone density scan. This is done to screen for osteoporosis. You may have this scan if you are at high risk for osteoporosis. Follow these instructions at home: Eating and drinking  Eat a diet that includes fresh fruits and vegetables, whole grains, lean protein, and low-fat dairy.  Take vitamin and mineral supplements as recommended by your health care provider.  Do not drink alcohol if: ? Your health care provider tells you not to drink. ? You are pregnant, may be pregnant, or are planning to become pregnant.  If you drink alcohol: ? Limit how much you have to 0-1 drink a day. ? Be aware of how much alcohol is in your drink. In the [place] one drink equals one 12 oz bottle of beer (355 mL), one 5 oz glass of wine (148 mL), or one 1 oz glass of hard liquor (44 mL). Lifestyle  Take daily care of your teeth and gums.  Stay active. Exercise for at least 30 minutes on 5 or more days each week.  Do not use any products that contain nicotine or tobacco, such as cigarettes, e-cigarettes, and chewing tobacco. If you need help quitting, ask your health care provider.  If you are sexually active, practice safe sex. Use a condom or other form of birth control (contraception) in order to prevent pregnancy and STIs (sexually transmitted infections).  If told by your health care provider, take low-dose aspirin daily starting at age 50. What's next?  Visit your health care provider once a year for a  well check visit.  Ask your health care provider how often you should have your eyes and teeth checked.  Stay up to date on all vaccines. This information is not intended to replace advice given to you by your health care provider. Make sure you discuss any questions you have with your health care provider. Document Revised: 04/08/2018 Document Reviewed: 04/08/2018 Elsevier Patient Education  2020 Elsevier Inc.  

## 2019-08-29 NOTE — Progress Notes (Signed)
Subjective:     Jackie Casey is a 59 y.o. female and is here for a comprehensive physical exam. The patient reports problems - chest pressure at times,  no indigestion,  lasts a few min and does not happen offten but is occurring more often.  Social History   Socioeconomic History  . Marital status: Married    Spouse name: Not on file  . Number of children: Not on file  . Years of education: Not on file  . Highest education level: Not on file  Occupational History  . Occupation: bus Geophysical data processor: [employer]  Tobacco Use  . Smoking status: Never Smoker  . Smokeless tobacco: Never Used  . Tobacco comment: NEVER USED TOBACCO  Substance and Sexual Activity  . Alcohol use: Yes    Alcohol/week: 0.0 standard drinks    Comment: rare--  1-2 x a month  . Drug use: No  . Sexual activity: Yes    Partners: Male    Birth control/protection: None  Other Topics Concern  . Not on file  Social History Narrative   Exercise-- crunches, jumping jacks etc, twice a week   Social Determinants of Health   Financial Resource Strain:   . Difficulty of Paying Living Expenses: Not on file  Food Insecurity:   . Worried About Charity fundraiser in the Last Year: Not on file  . Ran Out of Food in the Last Year: Not on file  Transportation Needs:   . Lack of Transportation (Medical): Not on file  . Lack of Transportation (Non-Medical): Not on file  Physical Activity:   . Days of Exercise per Week: Not on file  . Minutes of Exercise per Session: Not on file  Stress:   . Feeling of Stress : Not on file  Social Connections:   . Frequency of Communication with Friends and Family: Not on file  . Frequency of Social Gatherings with Friends and Family: Not on file  . Attends Religious Services: Not on file  . Active Member of Clubs or Organizations: Not on file  . Attends Archivist Meetings: Not on file  . Marital Status: Not on file  Intimate Partner Violence:   . Fear  of Current or Ex-Partner: Not on file  . Emotionally Abused: Not on file  . Physically Abused: Not on file  . Sexually Abused: Not on file   Health Maintenance  Topic Date Due  . PAP SMEAR-Modifier  11/22/2018  . COLONOSCOPY  12/25/2019  . TETANUS/TDAP  12/01/2020  . MAMMOGRAM  08/14/2021  . INFLUENZA VACCINE  Completed  . Hepatitis C Screening  Completed  . HIV Screening  Completed    The following portions of the patient's history were reviewed and updated as appropriate:  She  has a past medical history of Depression and Trigeminal neuralgia. She does not have any pertinent problems on file. She  has a past surgical history that includes Tonsillectomy. Her family history includes Asthma in her mother; Hypertension in her mother; Stroke in her mother. She  reports that she has never smoked. She has never used smokeless tobacco. She reports current alcohol use. She reports that she does not use drugs. She has a current medication list which includes the following prescription(s): aspirin, biotin w/ vitamins c & e, black cohosh, multivitamin, and pregabalin. Current Outpatient Medications on File Prior to Visit  Medication Sig Dispense Refill  . aspirin 81 MG EC tablet Take 81 mg by mouth.  Two times weekly    . Biotin w/ Vitamins C & E (HAIR/SKIN/NAILS PO) Take 1 tablet by mouth daily.    . Black Cohosh 40 MG CAPS Take 2 capsules by mouth daily.    . Multiple Vitamin (MULTIVITAMIN) tablet Take 1 tablet by mouth daily.       No current facility-administered medications on file prior to visit.   She has No Known Allergies..  Review of Systems Review of Systems  Constitutional: Negative for activity change, appetite change and fatigue.  HENT: Negative for hearing loss, congestion, tinnitus and ear discharge.  dentist q61m Eyes: Negative for visual disturbance (see optho q1y -- vision corrected to 20/20 with glasses).  Respiratory: Negative for cough, chest tightness and shortness  of breath.   Cardiovascular: Negative for palpitations and leg swelling. + chest pain Gastrointestinal: Negative for abdominal pain, diarrhea, constipation and abdominal distention.  Genitourinary: Negative for urgency, frequency, decreased urine volume and difficulty urinating.  Musculoskeletal: Negative for back pain, arthralgias and gait problem.  Skin: Negative for color change, pallor and rash.  Neurological: Negative for dizziness, light-headedness, numbness and headaches.  Hematological: Negative for adenopathy. Does not bruise/bleed easily.  Psychiatric/Behavioral: Negative for suicidal ideas, confusion, sleep disturbance, self-injury, dysphoric mood, decreased concentration and agitation.       Objective:    BP 122/80 (BP Location: Right Arm, Patient Position: Sitting, Cuff Size: Normal)   Pulse 72   Temp (!) 97.5 F (36.4 C) (Temporal)   Resp 18   Ht 5\' 5"  (1.651 m)   Wt 160 lb 12.8 oz (72.9 kg)   LMP 04/01/2016   BMI 26.76 kg/m  General appearance: alert, cooperative and appears stated age Head: Normocephalic, without obvious abnormality, atraumatic Eyes: negative findings: lids and lashes normal, conjunctivae and sclerae normal and pupils equal, round, reactive to light and accomodation Ears: normal TM's and external ear canals both ears Nose: Nares normal. Septum midline. Mucosa normal. No drainage or sinus tenderness. Throat: lips, mucosa, and tongue normal; teeth and gums normal Neck: no adenopathy, no carotid bruit, no JVD, supple, symmetrical, trachea midline and thyroid not enlarged, symmetric, no tenderness/mass/nodules Back: symmetric, no curvature. ROM normal. No CVA tenderness. Lungs: clear to auscultation bilaterally Breasts: normal appearance, no masses or tenderness Heart: regular rate and rhythm, S1, S2 normal, no murmur, click, rub or gallop Abdomen: soft, non-tender; bowel sounds normal; no masses,  no organomegaly Pelvic: cervix normal in appearance,  external genitalia normal, no adnexal masses or tenderness, no cervical motion tenderness, rectovaginal septum normal, uterus normal size, shape, and consistency, vagina normal without discharge and pap done,  rectal heme neg brown stool Extremities: extremities normal, atraumatic, no cyanosis or edema Pulses: 2+ and symmetric Skin: Skin color, texture, turgor normal. No rashes or lesions Lymph nodes: Cervical, supraclavicular, and axillary nodes normal. Neurologic: Alert and oriented X 3, normal strength and tone. Normal symmetric reflexes. Normal coordination and gait      EKG--sinus brady,  ST elevation ant - lat-- nonspecific    Assessment:    Healthy female exam.      Plan:    ghm utd Check labs  See After Visit Summary for Counseling Recommendations    1. Trigeminal neuralgia Stable-- refill lyrica  - pregabalin (LYRICA) 100 MG capsule; Take 1 capsule (100 mg total) by mouth 2 (two) times daily.  Dispense: 180 capsule; Refill: 1  2. Preventative health care ghm utd Check labs  See AVS  - CBC with Differential - TSH - Lipid panel -  Comprehensive metabolic panel - Cytology - PAP( [place])  3. Need for influenza vaccination  - Flu Vaccine QUAD 6+ mos PF IM (Fluarix Quad PF)  4. Chest pressure Check echo Refer to cardiology   - EKG 12-Lead - ECHOCARDIOGRAM COMPLETE; Future - Ambulatory referral to Cardiology

## 2019-09-02 ENCOUNTER — Encounter: Payer: Self-pay | Admitting: Cardiology

## 2019-09-02 ENCOUNTER — Other Ambulatory Visit: Payer: Self-pay

## 2019-09-02 ENCOUNTER — Ambulatory Visit (INDEPENDENT_AMBULATORY_CARE_PROVIDER_SITE_OTHER): Payer: BC Managed Care – PPO | Admitting: Cardiology

## 2019-09-02 VITALS — BP 110/64 | HR 54 | Ht 65.0 in | Wt 161.0 lb

## 2019-09-02 DIAGNOSIS — R079 Chest pain, unspecified: Secondary | ICD-10-CM

## 2019-09-02 DIAGNOSIS — Z7189 Other specified counseling: Secondary | ICD-10-CM | POA: Diagnosis not present

## 2019-09-02 LAB — CYTOLOGY - PAP
Chlamydia: NEGATIVE
Comment: NEGATIVE
Comment: NEGATIVE
Comment: NEGATIVE
Comment: NEGATIVE
Comment: NORMAL
Diagnosis: NEGATIVE
HSV1: NEGATIVE
HSV2: NEGATIVE
High risk HPV: NEGATIVE
Neisseria Gonorrhea: NEGATIVE
Trichomonas: NEGATIVE

## 2019-09-02 NOTE — Patient Instructions (Signed)
Medication Instructions:  Your Physician recommend you continue on your current medication as directed.    *If you need a refill on your cardiac medications before your next appointment, please call your pharmacy*  Lab Work: None  Testing/Procedures: Your physician has requested that you have an exercise tolerance test. For further information please visit [email or web address]. Please also follow instruction sheet, as given. [place]  You will need to have the coronavirus test completed prior to your procedure. This is a Drive Up Visit at the [facility] [address] Someone will direct you to the appropriate testing line. Please tell them that you are there for procedure testing. Stay in your car and someone will be with you shortly. Please make sure to have all other labs completed before this test because you will need to stay quarantined until your procedure.  Follow-Up: At [facility], you and your health needs are our priority.  As part of our continuing mission to provide you with exceptional heart care, we have created designated Provider Care Teams.  These Care Teams include your primary Cardiologist (physician) and Advanced Practice Providers (APPs -  Physician Assistants and Nurse Practitioners) who all work together to provide you with the care you need, when you need it.  Your next appointment:   As needed  The format for your next appointment:   Either In Person or Virtual  Provider:   Buford Dresser, MD    [facility] at [facility] [address], [place], [place] [address] Phone:  [phone]        You are scheduled for an Exercise Stress Test.  Please arrive 15 minutes prior to your appointment time for registration and insurance purposes.  The test will take approximately 45 minutes to complete.  How to prepare for your Exercise Stress Test: . Do bring a list of your  current medications with you.  If not listed below, you may take your medications as normal. . Do wear comfortable clothes (no dresses or overalls) and walking shoes, tennis shoes preferred (no heels or open toed shoes are allowed) . Do Not wear cologne, perfume, aftershave or lotions (deodorant is allowed). . Please report to [place], [address] for your test.  If these instructions are not followed, your test will have to be rescheduled.  If you have questions or concerns about your appointment, you can call the Stress Lab at [phone].  If you cannot keep your appointment, please provide 24 hours notification to the Stress Lab, to avoid a possible $50 charge to your account

## 2019-09-02 NOTE — Progress Notes (Signed)
Cardiology Office Note:    Date:  09/02/2019   ID:  Jackie Casey, DOB 1960/12/05, MRN [id number]  PCP:  Carollee Herter, Alferd Apa, DO  Cardiologist:  Buford Dresser, MD  Referring MD: Carollee Herter, Alferd Apa, *   CC: new patient consultation for chest pressure  History of Present Illness:    Jackie Casey is a 59 y.o. female with a hx of trigeminal neuralgia who is seen as a new consult at the request of [name], * for the evaluation and management of chest pressure.  Recent progress note dated 08/29/19 reviewed from Dr. Carollee Herter. At that visit, she reported chest pressure. Recommended to check echo and refer to cardiology for further evaluation. Echo not yet completed at the time of today's visit.  Chest pain: -Initial onset: several months -Quality: central chest pressure, makes her grab her chest -Frequency: intermittent, she is unsure. Hard to predict, has happened once in the last several days -Duration: 1-2 seconds -Associated symptoms: no shortness of breath, nausea, vomiting, diaphoresis -Aggravating/alleviating factors: none that she has found. Not related to food, she is not sure about exertion -Prior cardiac history: none -Prior workup: has had stress tests in the past, last one >20 years. Was told they were normal. No records.  -Prior treatment: none -Alcohol: occasionally, socially -Tobacco: never -Comorbidities: none -Exercise level: very active. Always busy. Exercise 2-3 times/week, does Zumba -Cardiac ROS: no shortness of breath, no PND, no orthopnea, no LE edema, no syncope -Family history: none heart disease that she knows of. Mother died of stroke (hemorrhagic)  Past Medical History:  Diagnosis Date   Depression    Trigeminal neuralgia     Past Surgical History:  Procedure Laterality Date   TONSILLECTOMY      Current Medications: Current Outpatient Medications on File Prior to Visit  Medication Sig   aspirin 81 MG EC tablet  Take 81 mg by mouth. Two times weekly   Black Cohosh 40 MG CAPS Take 2 capsules by mouth daily.   Multiple Vitamin (MULTIVITAMIN) tablet Take 1 tablet by mouth daily.     pregabalin (LYRICA) 100 MG capsule Take 1 capsule (100 mg total) by mouth 2 (two) times daily.   No current facility-administered medications on file prior to visit.     Allergies:   Patient has no known allergies.   Social History   Tobacco Use   Smoking status: Never Smoker   Smokeless tobacco: Never Used   Tobacco comment: NEVER USED TOBACCO  Substance Use Topics   Alcohol use: Yes    Alcohol/week: 0.0 standard drinks    Comment: rare--  1-2 x a month   Drug use: No    Family History: family history includes Asthma in her mother; Hypertension in her mother; Stroke in her mother.  ROS:   Please see the history of present illness.  Additional pertinent ROS: Constitutional: Negative for chills, fever, night sweats, unintentional weight loss  HENT: Negative for ear pain and hearing loss.   Eyes: Negative for loss of vision and eye pain.  Respiratory: Negative for cough, sputum, wheezing.   Cardiovascular: See HPI. Gastrointestinal: Negative for abdominal pain, melena, and hematochezia.  Genitourinary: Negative for dysuria and hematuria.  Musculoskeletal: Negative for falls and myalgias.  Skin: Negative for itching and rash.  Neurological: Negative for focal weakness, focal sensory changes and loss of consciousness.  Endo/Heme/Allergies: Does not bruise/bleed easily.     EKGs/Labs/Other Studies Reviewed:    The following studies were reviewed  today: No prior cardiac studies  EKG:  EKG is personally reviewed.  The ekg ordered today demonstrates sinus bradycardia  Recent Labs: 08/29/2019: ALT 16; BUN 22; Creatinine, Ser 0.91; Hemoglobin 11.7; Platelets 102.0; Potassium 4.0; Sodium 139; TSH 0.50  Recent Lipid Panel    Component Value Date/Time   CHOL 169 08/29/2019 1014   TRIG 45.0 08/29/2019  1014   HDL 56.70 08/29/2019 1014   CHOLHDL 3 08/29/2019 1014   VLDL 9.0 08/29/2019 1014   LDLCALC 103 (H) 08/29/2019 1014    Physical Exam:    VS:  BP 110/64    Pulse (!) 54    Ht 5\' 5"  (1.651 m)    Wt 161 lb (73 kg)    LMP 04/01/2016    SpO2 98%    BMI 26.79 kg/m     Wt Readings from Last 3 Encounters:  09/02/19 161 lb (73 kg)  08/29/19 160 lb 12.8 oz (72.9 kg)  07/04/19 160 lb (72.6 kg)    GEN: Well nourished, well developed in no acute distress HEENT: Normal, moist mucous membranes NECK: No JVD CARDIAC: regular rhythm, normal S1 and S2, no rubs or gallops. No murmurs. VASCULAR: Radial and DP pulses 2+ bilaterally. No carotid bruits RESPIRATORY:  Clear to auscultation without rales, wheezing or rhonchi  ABDOMEN: Soft, non-tender, non-distended MUSCULOSKELETAL:  Ambulates independently SKIN: Warm and dry, no edema NEUROLOGIC:  Alert and oriented x 3. No focal neuro deficits noted. PSYCHIATRIC:  Normal affect    ASSESSMENT:    1. Chest pain, unspecified type   2. Cardiac risk counseling   3. Counseling on health promotion and disease prevention    PLAN:    Chest pressure: unclear etiology. Able to exercise without worsening symptoms, but symptoms not improving. She is low risk based on ASCVD risk score.  -discussed treadmill stress, nuclear stress/lexiscan, and CT coronary angiography. Discussed pros and cons of each, including but not limited to false positive/false negative risk, radiation risk, and risk of IV contrast dye. Based on shared decision making, decision was made to pursue treadmill stress, and will use CT cardiac if negative -echo ordered previously by PCP, not yet completed.   Cardiac risk counseling and prevention recommendations: -recommend heart healthy/Mediterranean diet, with whole grains, fruits, vegetable, fish, lean meats, nuts, and olive oil. Limit salt. -recommend moderate walking, 3-5 times/week for 30-50 minutes each session. Aim for at least 150  minutes.week. Goal should be pace of 3 miles/hours, or walking 1.5 miles in 30 minutes -recommend avoidance of tobacco products. Avoid excess alcohol. -ASCVD risk score: The 10-year ASCVD risk score Mikey Bussing DC Brooke Bonito., et al., 2013) is: 2.8%   Values used to calculate the score:     Age: 31 years     Sex: Female     Is Non-Hispanic African American: Yes     Diabetic: No     Tobacco smoker: No     Systolic Blood Pressure: [id number] mmHg     Is BP treated: No     HDL Cholesterol: 56.7 mg/dL     Total Cholesterol: 169 mg/dL   -on aspirin per personal preference. We discussed prevention guidelines today. She has easy bruising with aspirin, will stop today.  Plan for follow up: TBD based on results of testing  Medication Adjustments/Labs and Tests Ordered: Current medicines are reviewed at length with the patient today.  Concerns regarding medicines are outlined above.  Orders Placed This Encounter  Procedures   EXERCISE TOLERANCE TEST (ETT)   EKG 12-Lead  No orders of the defined types were placed in this encounter.   Patient Instructions  Medication Instructions:  Your Physician recommend you continue on your current medication as directed.    *If you need a refill on your cardiac medications before your next appointment, please call your pharmacy*  Lab Work: None  Testing/Procedures: Your physician has requested that you have an exercise tolerance test. For further information please visit [email or web address]. Please also follow instruction sheet, as given. [place]. [address]  You will need to have the coronavirus test completed prior to your procedure. This is a Drive Up Visit at the [facility] [address] Someone will direct you to the appropriate testing line. Please tell them that you are there for procedure testing. Stay in your car and someone will be with you shortly. Please make sure to have all other labs completed before this test because you  will need to stay quarantined until your procedure.  Follow-Up: At [facility], you and your health needs are our priority.  As part of our continuing mission to provide you with exceptional heart care, we have created designated Provider Care Teams.  These Care Teams include your primary Cardiologist (physician) and Advanced Practice Providers (APPs -  Physician Assistants and Nurse Practitioners) who all work together to provide you with the care you need, when you need it.  Your next appointment:   As needed  The format for your next appointment:   Either In Person or Virtual  Provider:   Buford Dresser, MD    [facility] at [facility] [address], [place], [place] [address] Phone:  [phone]        You are scheduled for an Exercise Stress Test.  Please arrive 15 minutes prior to your appointment time for registration and insurance purposes.  The test will take approximately 45 minutes to complete.  How to prepare for your Exercise Stress Test:  Do bring a list of your current medications with you.  If not listed below, you may take your medications as normal.  Do wear comfortable clothes (no dresses or overalls) and walking shoes, tennis shoes preferred (no heels or open toed shoes are allowed)  Do Not wear cologne, perfume, aftershave or lotions (deodorant is allowed).  Please report to [place], [address] for your test.  If these instructions are not followed, your test will have to be rescheduled.  If you have questions or concerns about your appointment, you can call the Stress Lab at [phone].  If you cannot keep your appointment, please provide 24 hours notification to the Stress Lab, to avoid a possible $50 charge to your account    Signed, Buford Dresser, MD PhD 09/02/2019  [place]

## 2019-09-03 ENCOUNTER — Encounter: Payer: Self-pay | Admitting: Cardiology

## 2019-09-06 ENCOUNTER — Telehealth: Payer: Self-pay | Admitting: Family Medicine

## 2019-09-06 DIAGNOSIS — G500 Trigeminal neuralgia: Secondary | ICD-10-CM

## 2019-09-06 NOTE — Telephone Encounter (Signed)
Caller Name: Pt Phone: [phone]  Pt states that she spoke with the company for patient assistance on Lyrica and they told her they have not sent it out because they are waiting for information from Dr. Carollee Herter. Pt states they told her the form submitted was incorrect and incomplete. They need the "prescription coverage" section to be completed and a prescription for Lyrica.  Ph# for patient assitance [phone].  Pt would like call back after resolving.

## 2019-09-07 NOTE — Telephone Encounter (Signed)
Was it fixed?

## 2019-09-08 ENCOUNTER — Other Ambulatory Visit: Payer: Self-pay | Admitting: Family Medicine

## 2019-09-08 ENCOUNTER — Encounter: Payer: Self-pay | Admitting: *Deleted

## 2019-09-08 MED ORDER — PREGABALIN 100 MG PO CAPS: 100.0000 mg | ORAL_CAPSULE | Freq: Two times a day (BID) | ORAL | 1 refills | Status: DC

## 2019-09-08 NOTE — Telephone Encounter (Signed)
No, it wasn't fixed. I sent you the pended the Lyrica Rx with the correct pharmacy yesterday.

## 2019-09-16 ENCOUNTER — Other Ambulatory Visit (HOSPITAL_COMMUNITY): Payer: BC Managed Care – PPO

## 2019-09-20 ENCOUNTER — Inpatient Hospital Stay (HOSPITAL_COMMUNITY): Admission: RE | Admit: 2019-09-20 | Payer: BC Managed Care – PPO | Source: Ambulatory Visit

## 2019-09-22 ENCOUNTER — Telehealth (HOSPITAL_COMMUNITY): Payer: Self-pay

## 2019-09-22 NOTE — Telephone Encounter (Signed)
Encounter complete. 

## 2019-09-23 ENCOUNTER — Other Ambulatory Visit: Payer: Self-pay

## 2019-09-23 DIAGNOSIS — G500 Trigeminal neuralgia: Secondary | ICD-10-CM

## 2019-09-23 MED ORDER — PREGABALIN 100 MG PO CAPS: 100.0000 mg | ORAL_CAPSULE | Freq: Two times a day (BID) | ORAL | 1 refills | Status: DC

## 2019-09-23 NOTE — Telephone Encounter (Signed)
Requesting: Lyrica Contract: n/a UDS: n/a Last OV: 08/29/19 Next OV: N/A Last Refill: 09/08/19, #180--1 RF Database:   Please advise

## 2019-09-23 NOTE — Telephone Encounter (Signed)
Patient called in to see if Dr. Etter Sjogren can call in her prescription  For (pregabalin (LYRICA) 100 MG capsule [id number]    THANKS,

## 2019-09-24 ENCOUNTER — Other Ambulatory Visit (HOSPITAL_COMMUNITY)
Admission: RE | Admit: 2019-09-24 | Discharge: 2019-09-24 | Disposition: A | Discharge status: Home / Self Care | Payer: BC Managed Care – PPO | Source: Ambulatory Visit | Attending: Cardiology | Admitting: Cardiology

## 2019-09-24 DIAGNOSIS — U071 COVID-19: Secondary | ICD-10-CM | POA: Insufficient documentation

## 2019-09-24 DIAGNOSIS — Z01812 Encounter for preprocedural laboratory examination: Secondary | ICD-10-CM | POA: Insufficient documentation

## 2019-09-24 LAB — SARS CORONAVIRUS 2 (TAT 6-24 HRS): SARS Coronavirus 2: POSITIVE — AB

## 2019-09-25 ENCOUNTER — Telehealth: Payer: Self-pay | Admitting: Student

## 2019-09-25 NOTE — Progress Notes (Signed)
Spoke with Lucretia from the on-call service for Dr. Roni Bread regarding this pt's + covid result. Awaiting a call back from the on-call physician.

## 2019-09-25 NOTE — Progress Notes (Signed)
Dr. Annamary Carolin from the on-call service for Dr. Ellyn Hack made aware of the pt's + covid result, and for her or Dr. Ellyn Hack to inform the pt.

## 2019-09-25 NOTE — Telephone Encounter (Signed)
   I was notified that patient's COVID test came back positive. She is scheduled for ETT on 09/28/2019 so this will need to be postponed. I tried to call patient to notify her of the results but got her voicemail. Left message to call back.  Darreld Mclean, PA-C 09/25/2019 1:06 PM

## 2019-09-27 NOTE — Telephone Encounter (Signed)
Will route to Triage for their help in rescheduling ETT once patient has completed quarantine. Will also route to Dr. Harrell Gave, who ordered ETT, so that she is aware.

## 2019-09-28 ENCOUNTER — Ambulatory Visit (HOSPITAL_COMMUNITY)
Admission: RE | Admit: 2019-09-28 | Payer: BC Managed Care – PPO | Source: Ambulatory Visit | Attending: Cardiology | Admitting: Cardiology

## 2019-09-29 NOTE — Telephone Encounter (Signed)
Message sent to schedulers 

## 2019-09-30 ENCOUNTER — Telehealth: Payer: Self-pay | Admitting: Family Medicine

## 2019-09-30 ENCOUNTER — Other Ambulatory Visit: Payer: Self-pay | Admitting: Family Medicine

## 2019-09-30 DIAGNOSIS — G500 Trigeminal neuralgia: Secondary | ICD-10-CM

## 2019-09-30 MED ORDER — PREGABALIN 100 MG PO CAPS: 100.0000 mg | ORAL_CAPSULE | Freq: Two times a day (BID) | ORAL | 3 refills | Status: DC

## 2019-09-30 NOTE — Telephone Encounter (Signed)
done

## 2019-09-30 NOTE — Telephone Encounter (Signed)
Patient states that she need Dr Etter Sjogren Cheri Rous to fill out her portion for  Lyrica Assist. Patient states that she has called multiple times about this form   Please advise .

## 2019-09-30 NOTE — Telephone Encounter (Signed)
Left detailed message on machine that rx was sent to the patience assistance pharmacy and to let us know if she need anything else.

## 2019-09-30 NOTE — Telephone Encounter (Signed)
Can you please send Lyrica to [facility] electroin[facility]?  Patient has been waiting a while on this.

## 2019-10-19 ENCOUNTER — Other Ambulatory Visit: Payer: Self-pay | Admitting: *Deleted

## 2019-10-19 ENCOUNTER — Telehealth: Payer: Self-pay | Admitting: Family Medicine

## 2019-10-19 DIAGNOSIS — G500 Trigeminal neuralgia: Secondary | ICD-10-CM

## 2019-10-19 MED ORDER — PREGABALIN 100 MG PO CAPS: 100.0000 mg | ORAL_CAPSULE | Freq: Two times a day (BID) | ORAL | 3 refills | Status: DC

## 2019-10-19 NOTE — Telephone Encounter (Signed)
Spoke with [employer] [employer] and she just need to send in a copy of her prescription card to them.  Fax number given to her so she could fax it to [employer].

## 2019-10-19 NOTE — Telephone Encounter (Signed)
Please  Advise   Patient has called stating that she is trying to get medication sent to [place]. Patient has sent paperwork to our office in February for DR Lowne-Chase to complete, Lowne-Chase completed paperwork but did not send RX to [facility].

## 2019-11-04 ENCOUNTER — Other Ambulatory Visit: Payer: Self-pay

## 2019-11-07 ENCOUNTER — Ambulatory Visit: Payer: BC Managed Care – PPO | Admitting: Family Medicine

## 2019-11-07 ENCOUNTER — Encounter: Payer: Self-pay | Admitting: Family Medicine

## 2019-11-07 ENCOUNTER — Other Ambulatory Visit: Payer: Self-pay

## 2019-11-07 VITALS — BP 130/70 | HR 66 | Temp 97.7°F | Resp 18 | Ht 65.0 in | Wt 163.2 lb

## 2019-11-07 DIAGNOSIS — R519 Headache, unspecified: Secondary | ICD-10-CM

## 2019-11-07 DIAGNOSIS — R1013 Epigastric pain: Secondary | ICD-10-CM | POA: Insufficient documentation

## 2019-11-07 DIAGNOSIS — R079 Chest pain, unspecified: Secondary | ICD-10-CM | POA: Insufficient documentation

## 2019-11-07 MED ORDER — LEVOCETIRIZINE DIHYDROCHLORIDE 5 MG PO TABS: 5.0000 mg | ORAL_TABLET | Freq: Every evening | ORAL | 5 refills | Status: DC

## 2019-11-07 MED ORDER — FLUTICASONE PROPIONATE 50 MCG/ACT NA SUSP: 2.0000 | Freq: Every day | NASAL | 6 refills | Status: DC

## 2019-11-07 MED ORDER — PANTOPRAZOLE SODIUM 40 MG PO TBEC: 40.0000 mg | DELAYED_RELEASE_TABLET | Freq: Every day | ORAL | 3 refills | Status: DC

## 2019-11-07 MED ORDER — CEFDINIR 300 MG PO CAPS: 300.0000 mg | ORAL_CAPSULE | Freq: Two times a day (BID) | ORAL | 0 refills | Status: DC

## 2019-11-07 NOTE — Assessment & Plan Note (Signed)
ekg--- no change from previous F/u cardiolgy Pt had to cancel stress test due to covid--- needs to be rescheduled  ?gerd-- protonix qd

## 2019-11-07 NOTE — Patient Instructions (Signed)

## 2019-11-07 NOTE — Progress Notes (Signed)
Patient ID: Jackie Casey, female    DOB: 18-Jan-1961  Age: 59 y.o. MRN: [id number]    Subjective:  Subjective  HPI Jackie Casey presents for c/o indigestion , burping, chest pain and head feels funny--- sinus pressure and she took advil about 1 week ago and it helps  She had covid vaccine #2 over 1 week ago .    +Review of Systems  Constitutional: Negative for activity change, appetite change, chills, diaphoresis, fatigue, fever and unexpected weight change.  HENT: Positive for sinus pressure and sinus pain.   Eyes: Negative for pain, redness and visual disturbance.  Respiratory: Negative for cough, chest tightness, shortness of breath and wheezing.   Cardiovascular: Positive for chest pain. Negative for palpitations and leg swelling.  Gastrointestinal: Positive for abdominal pain. Negative for abdominal distention.  Endocrine: Negative for cold intolerance, heat intolerance, polydipsia, polyphagia and polyuria.  Genitourinary: Negative for difficulty urinating, dyspareunia, dysuria, flank pain, frequency, genital sores, hematuria, menstrual problem, pelvic pain, urgency, vaginal discharge and vaginal pain.  Musculoskeletal: Negative for back pain.  Neurological: Negative for dizziness, light-headedness, numbness and headaches.    History Past Medical History:  Diagnosis Date  . Depression   . Trigeminal neuralgia     She has a past surgical history that includes Tonsillectomy.   Her family history includes Asthma in her mother; Hypertension in her mother; Stroke in her mother.She reports that she has never smoked. She has never used smokeless tobacco. She reports current alcohol use. She reports that she does not use drugs.  Current Outpatient Medications on File Prior to Visit  Medication Sig Dispense Refill  . Black Cohosh 40 MG CAPS Take 2 capsules by mouth daily.    . Multiple Vitamin (MULTIVITAMIN) tablet Take 1 tablet by mouth daily.      . pregabalin (LYRICA) 100 MG  capsule Take 1 capsule (100 mg total) by mouth 2 (two) times daily. 180 capsule 3   No current facility-administered medications on file prior to visit.     Objective:  Objective  Physical Exam Vitals and nursing note reviewed.  Constitutional:      Appearance: She is well-developed.  HENT:     Head: Normocephalic and atraumatic.  Eyes:     Conjunctiva/sclera: Conjunctivae normal.  Neck:     Thyroid: No thyromegaly.     Vascular: No carotid bruit or JVD.  Cardiovascular:     Rate and Rhythm: Normal rate and regular rhythm.     Heart sounds: Normal heart sounds. No murmur.  Pulmonary:     Effort: Pulmonary effort is normal. No respiratory distress.     Breath sounds: Normal breath sounds. No wheezing or rales.  Chest:     Chest wall: No tenderness.  Musculoskeletal:        General: Normal range of motion.     Cervical back: Normal range of motion and neck supple.  Neurological:     Mental Status: She is alert and oriented to person, place, and time.    BP 130/70 (BP Location: Left Arm, Patient Position: Sitting, Cuff Size: Normal)   Pulse 66   Temp 97.7 F (36.5 C) (Temporal)   Resp 18   Ht 5\' 5"  (1.651 m)   Wt 163 lb 3.2 oz (74 kg)   LMP 04/01/2016   SpO2 100%   BMI 27.16 kg/m  Wt Readings from Last 3 Encounters:  11/07/19 163 lb 3.2 oz (74 kg)  09/02/19 161 lb (73 kg)  08/29/19 160 lb 12.8 oz (  72.9 kg)     Lab Results  Component Value Date   WBC 2.4 Repeated and verified X2. (L) 08/29/2019   HGB 11.7 (L) 08/29/2019   HCT 36.2 08/29/2019   PLT 102.0 (L) 08/29/2019   GLUCOSE 84 08/29/2019   CHOL 169 08/29/2019   TRIG 45.0 08/29/2019   HDL 56.70 08/29/2019   LDLCALC 103 (H) 08/29/2019   ALT 16 08/29/2019   AST 16 08/29/2019   NA 139 08/29/2019   K 4.0 08/29/2019   CL 105 08/29/2019   CREATININE 0.91 08/29/2019   BUN 22 08/29/2019   CO2 29 08/29/2019   TSH 0.50 08/29/2019   INR 1.1 03/15/2008   HGBA1C 5.3 09/05/2016   MICROALBUR <0.2 09/22/2014     No results found.   Assessment & Plan:  Plan  I am having Jackie Casey start on fluticasone, levocetirizine, cefdinir, and pantoprazole. I am also having her maintain her multivitamin, Black Cohosh, and pregabalin.  Meds ordered this encounter  Medications  . fluticasone (FLONASE) 50 MCG/ACT nasal spray    Sig: Place 2 sprays into both nostrils daily.    Dispense:  16 g    Refill:  6  . levocetirizine (XYZAL) 5 MG tablet    Sig: Take 1 tablet (5 mg total) by mouth every evening.    Dispense:  30 tablet    Refill:  5  . cefdinir (OMNICEF) 300 MG capsule    Sig: Take 1 capsule (300 mg total) by mouth 2 (two) times daily.    Dispense:  20 capsule    Refill:  0  . pantoprazole (PROTONIX) 40 MG tablet    Sig: Take 1 tablet (40 mg total) by mouth daily.    Dispense:  30 tablet    Refill:  3    Problem List Items Addressed This Visit      Unprioritized   Chest pain - Primary    ekg--- no change from previous F/u cardiolgy Pt had to cancel stress test due to covid--- needs to be rescheduled  ?gerd-- protonix qd       Relevant Orders   EKG 12-Lead   Dyspepsia   Relevant Medications   pantoprazole (PROTONIX) 40 MG tablet   Sinus headache   Relevant Medications   fluticasone (FLONASE) 50 MCG/ACT nasal spray   levocetirizine (XYZAL) 5 MG tablet   cefdinir (OMNICEF) 300 MG capsule      Follow-up: Return in about 3 months (around 02/07/2020), or if symptoms worsen or fail to improve, for hypertension, hyperlipidemia.  [name], DO

## 2019-11-08 ENCOUNTER — Telehealth (HOSPITAL_COMMUNITY): Payer: Self-pay | Admitting: Cardiology

## 2019-11-08 ENCOUNTER — Ambulatory Visit: Payer: BC Managed Care – PPO | Admitting: Family Medicine

## 2019-11-08 NOTE — Telephone Encounter (Signed)
Called patient to schedule GXT ordered by Dr. Harrell Gave and she has declined to do and wants to wait til after quarantine is over and unneccessary.  Order will be removed from the WQ. If patient decides to do we will reinstate order in the future.

## 2019-11-08 NOTE — Telephone Encounter (Signed)
Thank you :)

## 2019-11-11 ENCOUNTER — Telehealth: Payer: Self-pay | Admitting: *Deleted

## 2019-11-11 NOTE — Telephone Encounter (Signed)
Left message for patient to call and schedule ETT and  COVID screening

## 2019-11-16 ENCOUNTER — Ambulatory Visit (HOSPITAL_BASED_OUTPATIENT_CLINIC_OR_DEPARTMENT_OTHER)
Admission: RE | Admit: 2019-11-16 | Discharge: 2019-11-16 | Disposition: A | Discharge status: Home / Self Care | Payer: BC Managed Care – PPO | Source: Ambulatory Visit | Attending: Family Medicine | Admitting: Family Medicine

## 2019-11-16 ENCOUNTER — Other Ambulatory Visit: Payer: Self-pay

## 2019-11-16 DIAGNOSIS — R0789 Other chest pain: Secondary | ICD-10-CM

## 2019-11-16 NOTE — Progress Notes (Signed)
  Echocardiogram 2D Echocardiogram has been performed.  Jackie Casey 11/16/2019, 10:44 AM

## 2019-11-17 ENCOUNTER — Other Ambulatory Visit: Payer: Self-pay | Admitting: Family Medicine

## 2019-11-17 DIAGNOSIS — I5189 Other ill-defined heart diseases: Secondary | ICD-10-CM

## 2019-11-25 NOTE — Progress Notes (Signed)
Patient was positive for COVID on 09/24/19 the patient does not need to be retested for stress test on 4/22 due to protocol of not needed a retest within the 90 days of a positive covid test

## 2019-11-26 ENCOUNTER — Other Ambulatory Visit (HOSPITAL_COMMUNITY): Payer: BC Managed Care – PPO

## 2019-11-29 ENCOUNTER — Telehealth (HOSPITAL_COMMUNITY): Payer: Self-pay

## 2019-11-29 NOTE — Telephone Encounter (Signed)
Encounter complete. 

## 2019-12-01 ENCOUNTER — Other Ambulatory Visit: Payer: Self-pay

## 2019-12-01 ENCOUNTER — Encounter (HOSPITAL_COMMUNITY): Payer: Self-pay | Admitting: *Deleted

## 2019-12-01 ENCOUNTER — Ambulatory Visit (HOSPITAL_COMMUNITY)
Admission: RE | Admit: 2019-12-01 | Discharge: 2019-12-01 | Disposition: A | Discharge status: Home / Self Care | Payer: BC Managed Care – PPO | Source: Ambulatory Visit | Attending: Cardiology | Admitting: Cardiology

## 2019-12-01 DIAGNOSIS — R079 Chest pain, unspecified: Secondary | ICD-10-CM

## 2019-12-01 LAB — EXERCISE TOLERANCE TEST
Estimated workload: 12.5 METS
Exercise duration (min): 10 min
Exercise duration (sec): 30 s
MPHR: 160 {beats}/min
Peak HR: 162 {beats}/min
Percent HR: 101 %
Rest HR: 55 {beats}/min

## 2019-12-01 NOTE — Progress Notes (Unsigned)
Abnormal ETT was reviewed by Dr. Harrell Gave. Patient was discharged to go home.

## 2019-12-08 ENCOUNTER — Other Ambulatory Visit: Payer: Self-pay

## 2019-12-08 ENCOUNTER — Ambulatory Visit (INDEPENDENT_AMBULATORY_CARE_PROVIDER_SITE_OTHER): Payer: BC Managed Care – PPO | Admitting: Cardiology

## 2019-12-08 VITALS — BP 120/62 | HR 57 | Ht 65.0 in | Wt 160.0 lb

## 2019-12-08 DIAGNOSIS — Z712 Person consulting for explanation of examination or test findings: Secondary | ICD-10-CM

## 2019-12-08 DIAGNOSIS — R072 Precordial pain: Secondary | ICD-10-CM | POA: Diagnosis not present

## 2019-12-08 DIAGNOSIS — R9439 Abnormal result of other cardiovascular function study: Secondary | ICD-10-CM | POA: Diagnosis not present

## 2019-12-08 DIAGNOSIS — Z01812 Encounter for preprocedural laboratory examination: Secondary | ICD-10-CM

## 2019-12-08 DIAGNOSIS — Z7189 Other specified counseling: Secondary | ICD-10-CM

## 2019-12-08 NOTE — Patient Instructions (Signed)
Medication Instructions:  Your Physician recommend you continue on your current medication as directed.    *If you need a refill on your cardiac medications before your next appointment, please call your pharmacy*   Lab Work: Your physician recommends that you return for lab work 1 week prior to test (BMP).  If you have labs (blood work) drawn today and your tests are completely normal, you will receive your results only by: Marland Kitchen MyChart Message (if you have MyChart) OR . A paper copy in the mail If you have any lab test that is abnormal or we need to change your treatment, we will call you to review the results.   Testing/Procedures: Cardiac CT Angiography (CTA), is a special type of CT scan that uses a computer to produce multi-dimensional views of major blood vessels throughout the body. In CT angiography, a contrast material is injected through an IV to help visualize the blood vessels [facility]   Follow-Up: At [facility], you and your health needs are our priority.  As part of our continuing mission to provide you with exceptional heart care, we have created designated Provider Care Teams.  These Care Teams include your primary Cardiologist (physician) and Advanced Practice Providers (APPs -  Physician Assistants and Nurse Practitioners) who all work together to provide you with the care you need, when you need it.  We recommend signing up for the patient portal called "MyChart".  Sign up information is provided on this After Visit Summary.  MyChart is used to connect with patients for Virtual Visits (Telemedicine).  Patients are able to view lab/test results, encounter notes, upcoming appointments, etc.  Non-urgent messages can be sent to your provider as well.   To learn more about what you can do with MyChart, go to [email or web address].    Your next appointment:   1 year(s)  The format for your next appointment:   In Person  Provider:   Buford Dresser,  MD  Your cardiac CT will be scheduled at one of the below locations:   [facility] [address] [place], [place] [address] [phone]  If scheduled at [facility], please arrive at the [facility] main entrance of [facility] 30 minutes prior to test start time. Proceed to the [facility] Radiology Department (first floor) to check-in and test prep.  Please follow these instructions carefully (unless otherwise directed):  Hold all erectile dysfunction medications at least 3 days (72 hrs) prior to test.  On the Night Before the Test: . Be sure to Drink plenty of water. . Do not consume any caffeinated/decaffeinated beverages or chocolate 12 hours prior to your test. . Do not take any antihistamines 12 hours prior to your test.  On the Day of the Test: . Drink plenty of water. Do not drink any water within one hour of the test. . Do not eat any food 4 hours prior to the test. . You may take your regular medications prior to the test.  . FEMALES- please wear underwire-free bra if available        After the Test: . Drink plenty of water. . After receiving IV contrast, you may experience a mild flushed feeling. This is normal. . On occasion, you may experience a mild rash up to 24 hours after the test. This is not dangerous. If this occurs, you can take Benadryl 25 mg and increase your fluid intake. . If you experience trouble breathing, this can be serious. If  it is severe call 911 IMMEDIATELY. If it is mild, please call our office. . If you take any of these medications: Glipizide/Metformin, Avandament, Glucavance, please do not take 48 hours after completing test unless otherwise instructed.   Once we have confirmed authorization from your insurance company, we will call you to set up a date and time for your test.   For non-scheduling related questions, please contact the cardiac imaging nurse navigator should you have any  questions/concerns: Marchia Bond, RN Navigator Cardiac Imaging [facility] [phone] office  For scheduling needs, including cancellations and rescheduling, please call [phone].

## 2019-12-08 NOTE — Progress Notes (Signed)
Cardiology Office Note:    Date:  12/08/2019   ID:  Jackie Casey, DOB 20-Oct-1960, MRN [id number]  PCP:  Carollee Herter, Alferd Apa, DO  Cardiologist:  Buford Dresser, MD  Referring MD: Carollee Herter, Alferd Apa, *   CC: follow up  History of Present Illness:    Jackie Casey is a 59 y.o. female with a hx of trigeminal neuralgia, chest pressure who is seen for follow up today. I initially met her 09/02/19 as a new consult at the request of [name], * for the evaluation and management of chest pressure.  Today: Reviewed results of treadmill test and echo. No further issues since she stared PPI. Still exercising multiple times/week, no issues or limitations.   Discussed that on my review of her echo, the grade 3 diastolic dysfunction is likely inaccurate. Her medial and lateral e' are 10.8 and 11.3, and her e/e' is less than 15. Her E/A ratio is 2.21, TR vmax 2.8 m/s, LA 32 ml/m2. At most, this is indeterminate diastolic dysfunction. Discussed this is managed with good blood pressure control. She is very active, which is also great for maintaining diastolic function.   We also discussed her exercise treadmill stress test. She had excellent exercise capacity, able to do 12.5 METs without symptoms. However, she did have ST depressions. We discussed that this can be ischemia, but it can also be a false positive. Discussed options for further evaluation, decided to pursue CT cardiac for anatomy evaluation.  Denies shortness of breath at rest or with normal exertion. No PND, orthopnea, LE edema or unexpected weight gain. No syncope or palpitations.  Past Medical History:  Diagnosis Date  . Depression   . Trigeminal neuralgia     Past Surgical History:  Procedure Laterality Date  . TONSILLECTOMY      Current Medications: Current Outpatient Medications on File Prior to Visit  Medication Sig  . fluticasone (FLONASE) 50 MCG/ACT nasal spray Place 2 sprays into both nostrils  daily.  Marland Kitchen levocetirizine (XYZAL) 5 MG tablet Take 1 tablet (5 mg total) by mouth every evening.  . Multiple Vitamin (MULTIVITAMIN) tablet Take 1 tablet by mouth daily.    . pantoprazole (PROTONIX) 40 MG tablet Take 1 tablet (40 mg total) by mouth daily.  . pregabalin (LYRICA) 100 MG capsule Take 1 capsule (100 mg total) by mouth 2 (two) times daily.   No current facility-administered medications on file prior to visit.     Allergies:   Patient has no known allergies.   Social History   Tobacco Use  . Smoking status: Never Smoker  . Smokeless tobacco: Never Used  . Tobacco comment: NEVER USED TOBACCO  Substance Use Topics  . Alcohol use: Yes    Alcohol/week: 0.0 standard drinks    Comment: rare--  1-2 x a month  . Drug use: No    Family History: family history includes Asthma in her mother; Hypertension in her mother; Stroke in her mother.  ROS:   Please see the history of present illness.  Additional pertinent ROS otherwise unremarkable.  EKGs/Labs/Other Studies Reviewed:    The following studies were reviewed today: Echo 11/16/19 (see my interpretation of diastolic function above): 1. Left ventricular ejection fraction, by estimation, is 60 to 65%. The  left ventricle has normal function. The left ventricle has no regional  wall motion abnormalities. Left ventricular diastolic parameters are  consistent with Grade III diastolic  dysfunction (restrictive).  2. Left atrial size was moderately dilated.  3. The mitral valve is normal in structure. Mild mitral valve  regurgitation. No evidence of mitral stenosis.   ETT 12/01/19  Blood pressure demonstrated a normal response to exercise.  ST segment depression was noted during stress.   ETT with good exercise tolerance (10:30); no chest pain; normal blood pressure response; 2 to 3 mm of ST depression in the inferior leads suggestive of ischemia; abnormal exercise treadmill.  EKG:  EKG is personally reviewed.  The ekg  ordered 09/02/19 demonstrates sinus bradycardia  Recent Labs: 08/29/2019: ALT 16; BUN 22; Creatinine, Ser 0.91; Hemoglobin 11.7; Platelets 102.0; Potassium 4.0; Sodium 139; TSH 0.50  Recent Lipid Panel    Component Value Date/Time   CHOL 169 08/29/2019 1014   TRIG 45.0 08/29/2019 1014   HDL 56.70 08/29/2019 1014   CHOLHDL 3 08/29/2019 1014   VLDL 9.0 08/29/2019 1014   LDLCALC 103 (H) 08/29/2019 1014    Physical Exam:    VS:  BP 120/62   Pulse (!) 57   Ht '5\' 5"'  (1.651 m)   Wt 160 lb (72.6 kg)   LMP 04/01/2016   BMI 26.63 kg/m     Wt Readings from Last 3 Encounters:  12/08/19 160 lb (72.6 kg)  11/07/19 163 lb 3.2 oz (74 kg)  09/02/19 161 lb (73 kg)    GEN: Well nourished, well developed in no acute distress HEENT: Normal, moist mucous membranes NECK: No JVD CARDIAC: regular rhythm, normal S1 and S2, no rubs or gallops. No murmur. VASCULAR: Radial and DP pulses 2+ bilaterally. No carotid bruits RESPIRATORY:  Clear to auscultation without rales, wheezing or rhonchi  ABDOMEN: Soft, non-tender, non-distended MUSCULOSKELETAL:  Ambulates independently SKIN: Warm and dry, no edema NEUROLOGIC:  Alert and oriented x 3. No focal neuro deficits noted. PSYCHIATRIC:  Normal affect   ASSESSMENT:    1. Abnormal stress ECG with treadmill   2. Precordial pain   3. Pre-procedure lab exam   4. Encounter to discuss test results   5. Cardiac risk counseling   6. Counseling on health promotion and disease prevention    PLAN:    Chest pressure: improved with PPI. However, treadmill abnormal, see below  Abnormal exercise treadmill stress test: we reviewed treadmill and echo at length -discussed CT coronary angiography. Discussed pros and cons, including but not limited to false positive/false negative risk, radiation risk, and risk of IV contrast dye. Based on shared decision making, decision was made to pursue CT coronary angiography. -resting heart rate 57 bpm, will not give metoprolol  prior but can receive IV if needed -counseled on need to get BMET prior to test -counseled on use of sublingual nitroglycerin and its importance to a good test  Cardiac risk counseling and prevention recommendations: -recommend heart healthy/Mediterranean diet, with whole grains, fruits, vegetable, fish, lean meats, nuts, and olive oil. Limit salt. -recommend moderate walking, 3-5 times/week for 30-50 minutes each session. Aim for at least 150 minutes.week. Goal should be pace of 3 miles/hours, or walking 1.5 miles in 30 minutes -recommend avoidance of tobacco products. Avoid excess alcohol. -ASCVD risk score: The 10-year ASCVD risk score Mikey Bussing DC Brooke Bonito., et al., 2013) is: 3.6%   Values used to calculate the score:     Age: 33 years     Sex: Female     Is Non-Hispanic African American: Yes     Diabetic: No     Tobacco smoker: No     Systolic Blood Pressure: 947 mmHg     Is BP  treated: No     HDL Cholesterol: 56.7 mg/dL     Total Cholesterol: 169 mg/dL    Plan for follow up: 1 year or sooner if test abnormal  Medication Adjustments/Labs and Tests Ordered: Current medicines are reviewed at length with the patient today.  Concerns regarding medicines are outlined above.  Orders Placed This Encounter  Procedures  . CT CORONARY MORPH W/CTA COR W/SCORE W/CA W/CM &/OR WO/CM  . CT CORONARY FRACTIONAL FLOW RESERVE DATA PREP  . CT CORONARY FRACTIONAL FLOW RESERVE FLUID ANALYSIS  . Basic metabolic panel   No orders of the defined types were placed in this encounter.   Patient Instructions  Medication Instructions:  Your Physician recommend you continue on your current medication as directed.    *If you need a refill on your cardiac medications before your next appointment, please call your pharmacy*   Lab Work: Your physician recommends that you return for lab work 1 week prior to test (BMP).  If you have labs (blood work) drawn today and your tests are completely normal, you will  receive your results only by: Marland Kitchen MyChart Message (if you have MyChart) OR . A paper copy in the mail If you have any lab test that is abnormal or we need to change your treatment, we will call you to review the results.   Testing/Procedures: Cardiac CT Angiography (CTA), is a special type of CT scan that uses a computer to produce multi-dimensional views of major blood vessels throughout the body. In CT angiography, a contrast material is injected through an IV to help visualize the blood vessels [facility]   Follow-Up: At [facility], you and your health needs are our priority.  As part of our continuing mission to provide you with exceptional heart care, we have created designated Provider Care Teams.  These Care Teams include your primary Cardiologist (physician) and Advanced Practice Providers (APPs -  Physician Assistants and Nurse Practitioners) who all work together to provide you with the care you need, when you need it.  We recommend signing up for the patient portal called "MyChart".  Sign up information is provided on this After Visit Summary.  MyChart is used to connect with patients for Virtual Visits (Telemedicine).  Patients are able to view lab/test results, encounter notes, upcoming appointments, etc.  Non-urgent messages can be sent to your provider as well.   To learn more about what you can do with MyChart, go to [email or web address].    Your next appointment:   1 year(s)  The format for your next appointment:   In Person  Provider:   Buford Dresser, MD  Your cardiac CT will be scheduled at one of the below locations:   [facility] [address] [place], [place] [address] [phone]  If scheduled at [facility], please arrive at the [facility] main entrance of [facility] 30 minutes prior to test start time. Proceed to the [facility] Radiology Department (first floor) to check-in and test prep.  Please  follow these instructions carefully (unless otherwise directed):  Hold all erectile dysfunction medications at least 3 days (72 hrs) prior to test.  On the Night Before the Test: . Be sure to Drink plenty of water. . Do not consume any caffeinated/decaffeinated beverages or chocolate 12 hours prior to your test. . Do not take any antihistamines 12 hours prior to your test.  On the Day of the Test: . Drink plenty of water. Do not  drink any water within one hour of the test. . Do not eat any food 4 hours prior to the test. . You may take your regular medications prior to the test.  . FEMALES- please wear underwire-free bra if available        After the Test: . Drink plenty of water. . After receiving IV contrast, you may experience a mild flushed feeling. This is normal. . On occasion, you may experience a mild rash up to 24 hours after the test. This is not dangerous. If this occurs, you can take Benadryl 25 mg and increase your fluid intake. . If you experience trouble breathing, this can be serious. If it is severe call 911 IMMEDIATELY. If it is mild, please call our office. . If you take any of these medications: Glipizide/Metformin, Avandament, Glucavance, please do not take 48 hours after completing test unless otherwise instructed.   Once we have confirmed authorization from your insurance company, we will call you to set up a date and time for your test.   For non-scheduling related questions, please contact the cardiac imaging nurse navigator should you have any questions/concerns: Marchia Bond, RN Navigator Cardiac Imaging [facility] [phone] office  For scheduling needs, including cancellations and rescheduling, please call [phone].       Signed, Buford Dresser, MD PhD 12/08/2019  [place]

## 2020-01-10 ENCOUNTER — Encounter: Payer: Self-pay | Admitting: Dermatology

## 2020-01-10 ENCOUNTER — Other Ambulatory Visit: Payer: Self-pay

## 2020-01-10 ENCOUNTER — Ambulatory Visit: Payer: BC Managed Care – PPO | Admitting: Dermatology

## 2020-01-10 DIAGNOSIS — L910 Hypertrophic scar: Secondary | ICD-10-CM

## 2020-01-10 MED ORDER — TRIAMCINOLONE ACETONIDE 40 MG/ML IJ SUSP
40.0000 mg | Freq: Once | INTRAMUSCULAR | Status: AC
  Administered 2020-01-10: 40 mg

## 2020-01-15 ENCOUNTER — Encounter: Payer: Self-pay | Admitting: Dermatology

## 2020-01-15 MED ORDER — TRIAMCINOLONE ACETONIDE 40 MG/ML IJ SUSP: 40.0000 mg | Freq: Once | INTRAMUSCULAR | Status: DC

## 2020-01-15 NOTE — Progress Notes (Signed)
   Follow-Up Visit   Subjective  Jackie Casey is a 59 y.o. female who presents for the following: Follow-up (keloid on chest- injected previous office visit).  Keloid Location: Chest Duration:  Quality:  Associated Signs/Symptoms: Itch more than pain Modifying Factors: Previous injections helped Severity:  Timing: Context:   The following portions of the chart were reviewed this encounter and updated as appropriate:     Objective  Well appearing patient in no apparent distress; mood and affect are within normal limits.  A focused examination was performed including Head and neck and chest. Relevant physical exam findings are noted in the Assessment and Plan.   Assessment & Plan  Keloid Chest - Medial (Center)  Intralesional injection - Chest - Medial (Center) 0.2 cc 40 mg/cc triamcinolone injected, 4 injections total.

## 2020-01-19 ENCOUNTER — Encounter: Payer: Self-pay | Admitting: Cardiology

## 2020-01-19 DIAGNOSIS — R9439 Abnormal result of other cardiovascular function study: Secondary | ICD-10-CM | POA: Insufficient documentation

## 2020-02-21 ENCOUNTER — Other Ambulatory Visit: Payer: Self-pay

## 2020-02-21 ENCOUNTER — Encounter: Payer: Self-pay | Admitting: Dermatology

## 2020-02-21 ENCOUNTER — Ambulatory Visit: Payer: BC Managed Care – PPO | Admitting: Dermatology

## 2020-02-21 DIAGNOSIS — L910 Hypertrophic scar: Secondary | ICD-10-CM | POA: Diagnosis not present

## 2020-02-21 DIAGNOSIS — L815 Leukoderma, not elsewhere classified: Secondary | ICD-10-CM

## 2020-02-21 MED ORDER — TRIAMCINOLONE ACETONIDE 40 MG/ML IJ SUSP
40.0000 mg | Freq: Once | INTRAMUSCULAR | Status: AC
  Administered 2020-02-21: 40 mg

## 2020-02-21 NOTE — Patient Instructions (Addendum)
Several issues discussed with Jackie Casey today.  The keloid on her chest is definitely softer but only slightly flatter.  This was reinjected with triamcinolone 40 mg/cc and it was much easier to inject.  If there is enough improvement in this she can cancel her follow-up in 10 weeks.  We examined her upper back arms which showed subtle patches of lightening not complete depigmentation.  The 2 conditions that most commonly would produce this would either be a postinflammatory condition (no definite rash before the onset of the pigment loss) or a variant of guttate hypomelanosis.  There is really no benefit to laboratory studies or biopsy; this is not clinically vitiligo.  She may try a self tanner to see if that will blend the pigment.  Finally [name] wanted to discuss whether she could improve her central facial appearance with Botox.  I did honestly inform her that that is not part of my practice but if she would like to speak with an ethical plastic surgeon whom I trust I will provide her with Dr. Mitzi Hansen Schneider's telephone number and she can schedule this at her will.  Routine follow-up 10 weeks.

## 2020-02-23 ENCOUNTER — Telehealth (HOSPITAL_COMMUNITY): Payer: Self-pay | Admitting: *Deleted

## 2020-02-23 NOTE — Telephone Encounter (Signed)
Pt returning call regarding upcoming cardiac imaging study; pt verbalizes understanding of appt date/time, parking situation and where to check in, pre-test NPO status and medications ordered, and verified current allergies; name and call back number provided for further questions should they arise  Jackie Pattison Tai RN Navigator Cardiac Imaging [facility] [phone] office [phone] cell  

## 2020-02-23 NOTE — Telephone Encounter (Signed)
Attempted to call patient regarding upcoming cardiac CT appointment. Left message on voicemail with name and callback number  Laurice Kimmons Tai RN Navigator Cardiac Imaging [facility] [phone] Office [phone] Cell 

## 2020-02-24 LAB — BASIC METABOLIC PANEL
BUN/Creatinine Ratio: 13 (ref 12–28)
BUN: 13 mg/dL (ref 8–27)
CO2: 25 mmol/L (ref 20–29)
Calcium: 9.2 mg/dL (ref 8.7–10.3)
Chloride: 103 mmol/L (ref 96–106)
Creatinine, Ser: 1 mg/dL (ref 0.57–1.00)
GFR calc Af Amer: 71 mL/min/{1.73_m2} (ref >59)
GFR calc non Af Amer: 61 mL/min/{1.73_m2} (ref >59)
Glucose: 84 mg/dL (ref 65–99)
Potassium: 4.1 mmol/L (ref 3.5–5.2)
Sodium: 141 mmol/L (ref 134–144)

## 2020-02-27 ENCOUNTER — Encounter: Payer: BC Managed Care – PPO | Admitting: *Deleted

## 2020-02-27 ENCOUNTER — Ambulatory Visit (HOSPITAL_COMMUNITY)
Admission: RE | Admit: 2020-02-27 | Discharge: 2020-02-27 | Disposition: A | Discharge status: Home / Self Care | Payer: BC Managed Care – PPO | Source: Ambulatory Visit | Attending: Cardiology | Admitting: Cardiology

## 2020-02-27 ENCOUNTER — Other Ambulatory Visit: Payer: Self-pay

## 2020-02-27 DIAGNOSIS — Z006 Encounter for examination for normal comparison and control in clinical research program: Secondary | ICD-10-CM

## 2020-02-27 DIAGNOSIS — R072 Precordial pain: Secondary | ICD-10-CM | POA: Insufficient documentation

## 2020-02-27 MED ORDER — IOHEXOL 350 MG/ML SOLN
80.0000 mL | Freq: Once | INTRAVENOUS | Status: AC | PRN
  Administered 2020-02-27: 80 mL via INTRAVENOUS

## 2020-02-27 MED ORDER — NITROGLYCERIN 0.4 MG SL SUBL
SUBLINGUAL_TABLET | SUBLINGUAL | Status: AC
  Filled 2020-02-27: qty 2

## 2020-02-27 MED ORDER — NITROGLYCERIN 0.4 MG SL SUBL
0.8000 mg | SUBLINGUAL_TABLET | Freq: Once | SUBLINGUAL | Status: AC
  Administered 2020-02-27: 0.8 mg via SUBLINGUAL

## 2020-02-27 NOTE — Research (Signed)
CADFEM Informed Consent                  Subject Name:  [facility]   Subject met inclusion and exclusion criteria.  The informed consent form, study requirements and expectations were reviewed with the subject and questions and concerns were addressed prior to the signing of the consent form.  The subject verbalized understanding of the trial requirements.  The subject agreed to participate in the CADFEM trial and signed the informed consent.  The informed consent was obtained prior to performance of any protocol-specific procedures for the subject.  A copy of the signed informed consent was given to the subject and a copy was placed in the subject's medical record.   [place] Camps, Research Assistant  02/27/2020 09:01 a.m.

## 2020-02-27 NOTE — Progress Notes (Signed)
CT scan completed. Tolerated well. D/C home ambulatory, awake and alert. In no distress. 

## 2020-03-09 ENCOUNTER — Encounter: Payer: Self-pay | Admitting: Gastroenterology

## 2020-03-12 ENCOUNTER — Telehealth: Payer: Self-pay | Admitting: Cardiology

## 2020-03-12 NOTE — Telephone Encounter (Signed)
Patient is returning call to discuss results from CT completed on 02/27/20. Please call.

## 2020-03-12 NOTE — Telephone Encounter (Signed)
Called and spoke with pt, reviewed Dr.Christopher's result and recommendations for her CT. Pt states she would like a more in depth explanation. Notified we could set her up for a VV to discuss with Dr.Christopher or if she preferred I would send this message to Dr.Christopher for more explanation. Pt would like this message to be sent to Dr.Christopher for review. No other questions at this time.

## 2020-03-14 ENCOUNTER — Encounter: Payer: Self-pay | Admitting: Dermatology

## 2020-03-14 NOTE — Progress Notes (Signed)
   Follow-Up Visit   Subjective  Jackie Casey is a 59 y.o. female who presents for the following: Keloid (CHEST PREVIOUS TAC INJECTION, NOT AS HARD OR ITCHY).  Keloid Location: Chest Duration:  Quality:  Associated Signs/Symptoms: Modifying Factors: Previous injection helpful Severity:  Timing: Context: She would like to discuss several other skin issues  Objective  Well appearing patient in no apparent distress; mood and affect are within normal limits.  A focused examination was performed including Head, neck, back, chest.. Relevant physical exam findings are noted in the Assessment and Plan.   Assessment & Plan    Keloid Chest - Medial (Center)  triamcinolone acetonide (KENALOG-40) injection 40 mg - Chest - Medial (Center)  Intralesional injection - Chest - Medial (Center) Hibiclens prep, keloid injected with 40 mg/cc triamcinolone, total 0.2 cc.  Well-tolerated.  Guttate hypomelanosis (2) Left Upper Back; Right Upper Back  If aesthetically bothersome, may try a dihydroxy acetone based some of [place] tanner to see if this will help lighten the color.  No laboratory studies or biopsy currently indicated.   Several issues discussed with Jackie Casey today.  The keloid on her chest is definitely softer but only slightly flatter.  This was reinjected with triamcinolone 40 mg/cc and it was much easier to inject.  If there is enough improvement in this she can cancel her follow-up in 10 weeks.  We examined her upper back arms which showed subtle patches of lightening not complete depigmentation.  The 2 conditions that most commonly would produce this would either be a postinflammatory condition (no definite rash before the onset of the pigment loss) or a variant of guttate hypomelanosis.  There is really no benefit to laboratory studies or biopsy; this is not clinically vitiligo.  She may try a self tanner to see if that will blend the pigment.  Finally Jackie Casey wanted to discuss  whether she could improve her central facial appearance with Botox.  I did honestly inform her that that is not part of my practice but if she would like to speak with an ethical plastic surgeon whom I trust I will provide her with Dr. Mitzi Hansen Schneider's telephone number and she can schedule this at her will.  Routine follow-up 10 weeks.   I, Jackie Monarch, MD, have reviewed all documentation for this visit.  The documentation on 03/14/20 for the exam, diagnosis, procedures, and orders are all accurate and complete.

## 2020-03-19 NOTE — Telephone Encounter (Signed)
Can you ask her what she would like more information on? The CT was completely normal, no calcium, no blockages seen in the vessels. Thanks.

## 2020-03-20 NOTE — Telephone Encounter (Signed)
Left message to call back  

## 2020-03-20 NOTE — Telephone Encounter (Signed)
Follow up  ° ° °Pt returning call  °

## 2020-03-20 NOTE — Telephone Encounter (Signed)
Returned the call to the patient. Results were explained to her.   IMPRESSION: 1. No evidence of CAD, CADRADS = 0.  2. Coronary calcium score of 0. This was 0 percentile for age and sex matched control.  3. Normal coronary origin with right dominance.  She stated that she was still having pressure when she exerted herself and was concerned. She has been advised of her results but would still like for Dr. Harrell Gave to know to see what the next steps could be.

## 2020-03-21 NOTE — Telephone Encounter (Signed)
The CT suggests this is not a heart issue. Next step would be to follow up with PCP to discuss what other things may be causing her symptoms. Thanks.

## 2020-03-21 NOTE — Telephone Encounter (Signed)
Pt updated and verbalized understanding.  

## 2020-03-27 ENCOUNTER — Ambulatory Visit: Payer: BC Managed Care – PPO | Admitting: Family Medicine

## 2020-03-27 ENCOUNTER — Other Ambulatory Visit: Payer: Self-pay

## 2020-03-27 ENCOUNTER — Encounter: Payer: Self-pay | Admitting: Family Medicine

## 2020-03-27 ENCOUNTER — Ambulatory Visit (HOSPITAL_BASED_OUTPATIENT_CLINIC_OR_DEPARTMENT_OTHER)
Admission: RE | Admit: 2020-03-27 | Discharge: 2020-03-27 | Disposition: A | Discharge status: Home / Self Care | Payer: BC Managed Care – PPO | Source: Ambulatory Visit | Attending: Family Medicine | Admitting: Family Medicine

## 2020-03-27 VITALS — BP 149/68 | HR 58 | Temp 98.1°F | Resp 13 | Ht 65.0 in | Wt 157.2 lb

## 2020-03-27 DIAGNOSIS — R079 Chest pain, unspecified: Secondary | ICD-10-CM

## 2020-03-27 DIAGNOSIS — E785 Hyperlipidemia, unspecified: Secondary | ICD-10-CM

## 2020-03-27 LAB — CBC WITH DIFFERENTIAL/PLATELET
Basophils Absolute: 0 10*3/uL (ref 0.0–0.1)
Basophils Relative: 0.9 % (ref 0.0–3.0)
Eosinophils Absolute: 0 10*3/uL (ref 0.0–0.7)
Eosinophils Relative: 2 % (ref 0.0–5.0)
HCT: 36 % (ref 36.0–46.0)
Hemoglobin: 11.6 g/dL — ABNORMAL LOW (ref 12.0–15.0)
Lymphocytes Relative: 28.5 % (ref 12.0–46.0)
Lymphs Abs: 0.7 10*3/uL (ref 0.7–4.0)
MCHC: 32.2 g/dL (ref 30.0–36.0)
MCV: 86.5 fl (ref 78.0–100.0)
Monocytes Absolute: 0.2 10*3/uL (ref 0.1–1.0)
Monocytes Relative: 7.8 % (ref 3.0–12.0)
Neutro Abs: 1.5 10*3/uL (ref 1.4–7.7)
Neutrophils Relative %: 60.8 % (ref 43.0–77.0)
Platelets: 105 10*3/uL — ABNORMAL LOW (ref 150.0–400.0)
RBC: 4.17 Mil/uL (ref 3.87–5.11)
RDW: 13.6 % (ref 11.5–15.5)
WBC: 2.4 10*3/uL — ABNORMAL LOW (ref 4.0–10.5)

## 2020-03-27 LAB — COMPREHENSIVE METABOLIC PANEL
ALT: 17 U/L (ref 0–35)
AST: 18 U/L (ref 0–37)
Albumin: 4.3 g/dL (ref 3.5–5.2)
Alkaline Phosphatase: 51 U/L (ref 39–117)
BUN: 17 mg/dL (ref 6–23)
CO2: 33 mEq/L — ABNORMAL HIGH (ref 19–32)
Calcium: 9.6 mg/dL (ref 8.4–10.5)
Chloride: 104 mEq/L (ref 96–112)
Creatinine, Ser: 1.05 mg/dL (ref 0.40–1.20)
GFR: 64.56 mL/min (ref >60.00)
Glucose, Bld: 52 mg/dL — ABNORMAL LOW (ref 70–99)
Potassium: 4.2 mEq/L (ref 3.5–5.1)
Sodium: 142 mEq/L (ref 135–145)
Total Bilirubin: 0.3 mg/dL (ref 0.2–1.2)
Total Protein: 7.5 g/dL (ref 6.0–8.3)

## 2020-03-27 LAB — LIPID PANEL
Cholesterol: 158 mg/dL (ref 0–200)
HDL: 60.7 mg/dL (ref >39.00)
LDL Cholesterol: 79 mg/dL (ref 0–99)
NonHDL: 97.17
Total CHOL/HDL Ratio: 3
Triglycerides: 90 mg/dL (ref 0.0–149.0)
VLDL: 18 mg/dL (ref 0.0–40.0)

## 2020-03-27 LAB — D-DIMER, QUANTITATIVE: D-Dimer, Quant: <0.19 mcg/mL FEU (ref <0.50)

## 2020-03-27 NOTE — Assessment & Plan Note (Addendum)
cardiolgy w/u neg Check cxr and spirometry  bp borderline today--- dash diet ,  F/u 2-3 weeks or sooner prn

## 2020-03-27 NOTE — Progress Notes (Signed)
Patient ID: Jackie Casey, female    DOB: [birth year]  Age: 59 y.o. MRN: [id number]    Subjective:  Subjective  HPI Maziah Wendt presents for f/u cardiology.   Cards w/u neg for chest pain and pt was advised to f/u here.  She cont to have chest pressure.  No sob today but states she does feel more sob and cp when outside in humidity or when she exercises---- -no wheezing that she knows of.  Pt denies anxiety--- does admit to some stress  Review of Systems  Constitutional: Negative for appetite change, diaphoresis, fatigue and unexpected weight change.  Eyes: Negative for pain, redness and visual disturbance.  Respiratory: Positive for chest tightness and shortness of breath. Negative for cough and wheezing.   Cardiovascular: Positive for chest pain. Negative for palpitations and leg swelling.  Endocrine: Negative for cold intolerance, heat intolerance, polydipsia, polyphagia and polyuria.  Genitourinary: Negative for difficulty urinating, dysuria and frequency.  Neurological: Negative for dizziness, light-headedness, numbness and headaches.    History Past Medical History:  Diagnosis Date  . Depression   . Trigeminal neuralgia     She has a past surgical history that includes Tonsillectomy.   Her family history includes Asthma in her mother; Hypertension in her mother; Stroke in her mother.She reports that she has never smoked. She has never used smokeless tobacco. She reports current alcohol use. She reports that she does not use drugs.  Current Outpatient Medications on File Prior to Visit  Medication Sig Dispense Refill  . fluticasone (FLONASE) 50 MCG/ACT nasal spray Place 2 sprays into both nostrils daily. 16 g 6  . levocetirizine (XYZAL) 5 MG tablet Take 1 tablet (5 mg total) by mouth every evening. 30 tablet 5  . Multiple Vitamin (MULTIVITAMIN) tablet Take 1 tablet by mouth daily.      . pantoprazole (PROTONIX) 40 MG tablet Take 1 tablet (40 mg total) by mouth daily. 30  tablet 3  . pregabalin (LYRICA) 100 MG capsule Take 1 capsule (100 mg total) by mouth 2 (two) times daily. 180 capsule 3   Current Facility-Administered Medications on File Prior to Visit  Medication Dose Route Frequency Provider Last Rate Last Admin  . triamcinolone acetonide (KENALOG-40) injection 40 mg  40 mg Intramuscular Once [name], MD         Objective:  Objective  Physical Exam Vitals and nursing note reviewed.  Constitutional:      Appearance: She is well-developed.  HENT:     Head: Normocephalic and atraumatic.  Eyes:     Conjunctiva/sclera: Conjunctivae normal.  Neck:     Thyroid: No thyromegaly.     Vascular: No carotid bruit or JVD.  Cardiovascular:     Rate and Rhythm: Normal rate and regular rhythm.     Heart sounds: Normal heart sounds. No murmur heard.   Pulmonary:     Effort: Pulmonary effort is normal. No respiratory distress.     Breath sounds: Normal breath sounds. No wheezing or rales.  Chest:     Chest wall: No tenderness.  Musculoskeletal:     Cervical back: Normal range of motion and neck supple.  Neurological:     Mental Status: She is alert and oriented to person, place, and time.    BP (!) 149/68 (BP Location: Right Arm, Patient Position: Sitting)   Pulse (!) 58   Temp 98.1 F (36.7 C) (Oral)   Resp 13   Ht 5\' 5"  (1.651 m)   Wt 157 lb 3.2 oz (  71.3 kg)   LMP 04/01/2016   SpO2 100%   BMI 26.16 kg/m  Wt Readings from Last 3 Encounters:  03/27/20 157 lb 3.2 oz (71.3 kg)  12/08/19 160 lb (72.6 kg)  11/07/19 163 lb 3.2 oz (74 kg)     Lab Results  Component Value Date   WBC 2.4 Repeated and verified X2. (L) 08/29/2019   HGB 11.7 (L) 08/29/2019   HCT 36.2 08/29/2019   PLT 102.0 (L) 08/29/2019   GLUCOSE 84 02/24/2020   CHOL 169 08/29/2019   TRIG 45.0 08/29/2019   HDL 56.70 08/29/2019   LDLCALC 103 (H) 08/29/2019   ALT 16 08/29/2019   AST 16 08/29/2019   NA 141 02/24/2020   K 4.1 02/24/2020   CL 103 02/24/2020    CREATININE 1.00 02/24/2020   BUN 13 02/24/2020   CO2 25 02/24/2020   TSH 0.50 08/29/2019   INR 1.1 03/15/2008   HGBA1C 5.3 09/05/2016   MICROALBUR <0.2 09/22/2014    CT CORONARY MORPH W/CTA COR W/SCORE W/CA W/CM &/OR WO/CM  Addendum Date: 02/28/2020   ADDENDUM REPORT: 02/28/2020 17:17 HISTORY: Chest pain, nonspecific EXAM: Cardiac/Coronary CT TECHNIQUE: The patient was scanned on a [employer]. PROTOCOL: A 120 kV prospective scan was triggered in the descending thoracic aorta at 111 HU's. Axial non-contrast 3 mm slices were carried out through the heart. The data set was analyzed on a dedicated work station and scored using the [place]. Gantry rotation speed was 250 msecs and collimation was 0.6 mm. 0.8 mg of sl NTG was given. The 3D data set was reconstructed in 5% intervals of 35-75% of the R-R cycle. Diastolic phases were analyzed on a dedicated work station using MPR, MIP and VRT modes. The patient received 58mL OMNIPAQUE IOHEXOL 350 MG/ML SOLN of contrast. FINDINGS: Coronary calcium score: The patient's coronary artery calcium score is 0, which places the patient in the 0th percentile. Coronary arteries: Normal coronary origins.  Right dominance. Right Coronary Artery: Normal caliber vessel, gives rise to PDA. No significant plaque or stenosis. Left Main Coronary Artery: Normal caliber vessel. No significant plaque or stenosis. Left Anterior Descending Coronary Artery: Normal caliber vessel. No significant plaque or stenosis. Gives rise to 3 small diagonal branches. Distal LAD wraps apex. Left Circumflex Artery: Normal caliber vessel. No significant plaque or stenosis. Gives rise to 2 OM branches. Aorta: Normal size, 31 mm at the mid ascending aorta (level of the PA bifurcation) measured double oblique. No calcifications. No dissection. Aortic Valve: No calcifications. Trileaflet. Other findings: Normal pulmonary vein drainage into the left atrium. Normal left atrial appendage  without a thrombus. Normal size of the pulmonary artery. IMPRESSION: 1. No evidence of CAD, CADRADS = 0. 2. Coronary calcium score of 0. This was 0 percentile for age and sex matched control. 3. Normal coronary origin with right dominance. Electronically Signed   By: Buford Dresser M.D.   On: 02/28/2020 17:17   Result Date: 02/28/2020 EXAM: OVER-READ INTERPRETATION  CT CHEST The following report is an over-read performed by radiologist Dr. Vinnie Langton of [facility], [place] on 02/27/2020. This over-read does not include interpretation of cardiac or coronary anatomy or pathology. The coronary calcium score/coronary CTA interpretation by the cardiologist is attached. COMPARISON:  None. FINDINGS: Within the visualized portions of the thorax there are no suspicious appearing pulmonary nodules or masses, there is no acute consolidative airspace disease, no pleural effusions, no pneumothorax and no lymphadenopathy. Visualized portions of the upper abdomen are unremarkable. There  are no aggressive appearing lytic or blastic lesions noted in the visualized portions of the skeleton. IMPRESSION: 1. No significant incidental noncardiac findings are noted. Electronically Signed: By: Vinnie Langton M.D. On: 02/27/2020 10:35     Assessment & Plan:  Plan  I am having Kitana Medlock maintain her multivitamin, pregabalin, fluticasone, levocetirizine, and pantoprazole. We will continue to administer triamcinolone acetonide.  No orders of the defined types were placed in this encounter.   Problem List Items Addressed This Visit      Unprioritized   Chest pain - Primary   Relevant Orders   DG Chest 2 View   Spirometry with Graph   Spirometry with graph   Lipid panel   Comprehensive metabolic panel   CBC with Differential/Platelet   D-Dimer, Quantitative    Other Visit Diagnoses    Dyslipidemia       Relevant Orders   Lipid panel   Comprehensive metabolic panel   CBC with  Differential/Platelet      Follow-up: Return in about 2 weeks (around 04/10/2020), or if symptoms worsen or fail to improve, for f/u bp.  [name], DO

## 2020-03-27 NOTE — Patient Instructions (Signed)
DASH Eating Plan DASH stands for "Dietary Approaches to Stop Hypertension." The DASH eating plan is a healthy eating plan that has been shown to reduce high blood pressure (hypertension). It may also reduce your risk for type 2 diabetes, heart disease, and stroke. The DASH eating plan may also help with weight loss. What are tips for following this plan?  General guidelines  Avoid eating more than 2,300 mg (milligrams) of salt (sodium) a day. If you have hypertension, you may need to reduce your sodium intake to 1,500 mg a day.  Limit alcohol intake to no more than 1 drink a day for nonpregnant women and 2 drinks a day for men. One drink equals 12 oz of beer, 5 oz of wine, or 1 oz of hard liquor.  Work with your health care provider to maintain a healthy body weight or to lose weight. Ask what an ideal weight is for you.  Get at least 30 minutes of exercise that causes your heart to beat faster (aerobic exercise) most days of the week. Activities may include walking, swimming, or biking.  Work with your health care provider or diet and nutrition specialist (dietitian) to adjust your eating plan to your individual calorie needs. Reading food labels   Check food labels for the amount of sodium per serving. Choose foods with less than 5 percent of the Daily Value of sodium. Generally, foods with less than 300 mg of sodium per serving fit into this eating plan.  To find whole grains, look for the word "whole" as the first word in the ingredient list. Shopping  Buy products labeled as "low-sodium" or "no salt added."  Buy fresh foods. Avoid canned foods and premade or frozen meals. Cooking  Avoid adding salt when cooking. Use salt-free seasonings or herbs instead of table salt or sea salt. Check with your health care provider or pharmacist before using salt substitutes.  Do not fry foods. Cook foods using healthy methods such as baking, boiling, grilling, and broiling instead.  Cook with  heart-healthy oils, such as olive, canola, soybean, or sunflower oil. Meal planning  Eat a balanced diet that includes: ? 5 or more servings of fruits and vegetables each day. At each meal, try to fill half of your plate with fruits and vegetables. ? Up to 6-8 servings of whole grains each day. ? Less than 6 oz of lean meat, poultry, or fish each day. A 3-oz serving of meat is about the same size as a deck of cards. One egg equals 1 oz. ? 2 servings of low-fat dairy each day. ? A serving of nuts, seeds, or beans 5 times each week. ? Heart-healthy fats. Healthy fats called Omega-3 fatty acids are found in foods such as flaxseeds and coldwater fish, like sardines, salmon, and mackerel.  Limit how much you eat of the following: ? Canned or prepackaged foods. ? Food that is high in trans fat, such as fried foods. ? Food that is high in saturated fat, such as fatty meat. ? Sweets, desserts, sugary drinks, and other foods with added sugar. ? Full-fat dairy products.  Do not salt foods before eating.  Try to eat at least 2 vegetarian meals each week.  Eat more home-cooked food and less restaurant, buffet, and fast food.  When eating at a restaurant, ask that your food be prepared with less salt or no salt, if possible. What foods are recommended? The items listed may not be a complete list. Talk with your dietitian about   what dietary choices are best for you. Grains Whole-grain or whole-wheat bread. Whole-grain or whole-wheat pasta. Brown rice. Oatmeal. Quinoa. Bulgur. Whole-grain and low-sodium cereals. Pita bread. Low-fat, low-sodium crackers. Whole-wheat flour tortillas. Vegetables Fresh or frozen vegetables (raw, steamed, roasted, or grilled). Low-sodium or reduced-sodium tomato and vegetable juice. Low-sodium or reduced-sodium tomato sauce and tomato paste. Low-sodium or reduced-sodium canned vegetables. Fruits All fresh, dried, or frozen fruit. Canned fruit in natural juice (without  added sugar). Meat and other protein foods Skinless chicken or turkey. Ground chicken or turkey. Pork with fat trimmed off. Fish and seafood. Egg whites. Dried beans, peas, or lentils. Unsalted nuts, nut butters, and seeds. Unsalted canned beans. Lean cuts of beef with fat trimmed off. Low-sodium, lean deli meat. Dairy Low-fat (1%) or fat-free (skim) milk. Fat-free, low-fat, or reduced-fat cheeses. Nonfat, low-sodium ricotta or cottage cheese. Low-fat or nonfat yogurt. Low-fat, low-sodium cheese. Fats and oils Soft margarine without trans fats. Vegetable oil. Low-fat, reduced-fat, or light mayonnaise and salad dressings (reduced-sodium). Canola, safflower, olive, soybean, and sunflower oils. Avocado. Seasoning and other foods Herbs. Spices. Seasoning mixes without salt. Unsalted popcorn and pretzels. Fat-free sweets. What foods are not recommended? The items listed may not be a complete list. Talk with your dietitian about what dietary choices are best for you. Grains Baked goods made with fat, such as croissants, muffins, or some breads. Dry pasta or rice meal packs. Vegetables Creamed or fried vegetables. Vegetables in a cheese sauce. Regular canned vegetables (not low-sodium or reduced-sodium). Regular canned tomato sauce and paste (not low-sodium or reduced-sodium). Regular tomato and vegetable juice (not low-sodium or reduced-sodium). Pickles. Olives. Fruits Canned fruit in a light or heavy syrup. Fried fruit. Fruit in cream or butter sauce. Meat and other protein foods Fatty cuts of meat. Ribs. Fried meat. Bacon. Sausage. Bologna and other processed lunch meats. Salami. Fatback. Hotdogs. Bratwurst. Salted nuts and seeds. Canned beans with added salt. Canned or smoked fish. Whole eggs or egg yolks. Chicken or turkey with skin. Dairy Whole or 2% milk, cream, and half-and-half. Whole or full-fat cream cheese. Whole-fat or sweetened yogurt. Full-fat cheese. Nondairy creamers. Whipped toppings.  Processed cheese and cheese spreads. Fats and oils Butter. Stick margarine. Lard. Shortening. Ghee. Bacon fat. Tropical oils, such as coconut, palm kernel, or palm oil. Seasoning and other foods Salted popcorn and pretzels. Onion salt, garlic salt, seasoned salt, table salt, and sea salt. Worcestershire sauce. Tartar sauce. Barbecue sauce. Teriyaki sauce. Soy sauce, including reduced-sodium. Steak sauce. Canned and packaged gravies. Fish sauce. Oyster sauce. Cocktail sauce. Horseradish that you find on the shelf. Ketchup. Mustard. Meat flavorings and tenderizers. Bouillon cubes. Hot sauce and Tabasco sauce. Premade or packaged marinades. Premade or packaged taco seasonings. Relishes. Regular salad dressings. Where to find more information:  National Heart, Lung, and Blood Institute: www.nhlbi.nih.gov  American Heart Association: www.heart.org Summary  The DASH eating plan is a healthy eating plan that has been shown to reduce high blood pressure (hypertension). It may also reduce your risk for type 2 diabetes, heart disease, and stroke.  With the DASH eating plan, you should limit salt (sodium) intake to 2,300 mg a day. If you have hypertension, you may need to reduce your sodium intake to 1,500 mg a day.  When on the DASH eating plan, aim to eat more fresh fruits and vegetables, whole grains, lean proteins, low-fat dairy, and heart-healthy fats.  Work with your health care provider or diet and nutrition specialist (dietitian) to adjust your eating plan to your   individual calorie needs. This information is not intended to replace advice given to you by your health care provider. Make sure you discuss any questions you have with your health care provider. Document Revised: 07/10/2017 Document Reviewed: 07/21/2016 Elsevier Patient Education  2020 [employer]  

## 2020-04-03 ENCOUNTER — Ambulatory Visit: Payer: BC Managed Care – PPO | Admitting: Family Medicine

## 2020-04-03 ENCOUNTER — Other Ambulatory Visit: Payer: Self-pay

## 2020-04-03 ENCOUNTER — Encounter: Payer: Self-pay | Admitting: Family Medicine

## 2020-04-03 VITALS — BP 140/60 | HR 81 | Temp 98.8°F | Resp 18 | Ht 65.0 in | Wt 155.6 lb

## 2020-04-03 DIAGNOSIS — F418 Other specified anxiety disorders: Secondary | ICD-10-CM | POA: Diagnosis not present

## 2020-04-03 DIAGNOSIS — R5383 Other fatigue: Secondary | ICD-10-CM

## 2020-04-03 DIAGNOSIS — R0789 Other chest pain: Secondary | ICD-10-CM | POA: Diagnosis not present

## 2020-04-03 DIAGNOSIS — R42 Dizziness and giddiness: Secondary | ICD-10-CM | POA: Diagnosis not present

## 2020-04-03 MED ORDER — MECLIZINE HCL 25 MG PO TABS: 25.0000 mg | ORAL_TABLET | Freq: Three times a day (TID) | ORAL | 0 refills | Status: DC | PRN

## 2020-04-03 MED ORDER — ESCITALOPRAM OXALATE 10 MG PO TABS: 10.0000 mg | ORAL_TABLET | Freq: Every day | ORAL | 2 refills | Status: DC

## 2020-04-03 NOTE — Patient Instructions (Signed)

## 2020-04-03 NOTE — Progress Notes (Signed)
Patient ID: Jackie Casey, female    DOB: [birth year]  Age: 59 y.o. MRN: [id number]    Subjective:  Subjective  HPI Jackie Casey presents for dizziness and ringing in ears x 4-5 days.   She does admit to some anxiety  Chest pain has improved  Spirometry has not been done  yet   Review of Systems  Constitutional: Negative for appetite change, diaphoresis, fatigue and unexpected weight change.  HENT: Positive for tinnitus. Negative for congestion, dental problem, drooling, ear discharge, ear pain, facial swelling and hearing loss.   Eyes: Negative for pain, redness and visual disturbance.  Respiratory: Negative for cough, chest tightness, shortness of breath and wheezing.   Cardiovascular: Negative for chest pain, palpitations and leg swelling.  Endocrine: Negative for cold intolerance, heat intolerance, polydipsia, polyphagia and polyuria.  Genitourinary: Negative for difficulty urinating, dysuria and frequency.  Neurological: Positive for dizziness. Negative for light-headedness, numbness and headaches.  Psychiatric/Behavioral: The patient is nervous/anxious.     History Past Medical History:  Diagnosis Date  . Depression   . Trigeminal neuralgia     She has a past surgical history that includes Tonsillectomy.   Her family history includes Asthma in her mother; Hypertension in her mother; Stroke in her mother.She reports that she has never smoked. She has never used smokeless tobacco. She reports current alcohol use. She reports that she does not use drugs.  Current Outpatient Medications on File Prior to Visit  Medication Sig Dispense Refill  . Multiple Vitamin (MULTIVITAMIN) tablet Take 1 tablet by mouth daily.      . pregabalin (LYRICA) 100 MG capsule Take 1 capsule (100 mg total) by mouth 2 (two) times daily. 180 capsule 3   Current Facility-Administered Medications on File Prior to Visit  Medication Dose Route Frequency Provider Last Rate Last Admin  . triamcinolone  acetonide (KENALOG-40) injection 40 mg  40 mg Intramuscular Once [name], MD         Objective:  Objective  Physical Exam Vitals and nursing note reviewed.  Constitutional:      Appearance: She is well-developed.  HENT:     Head: Normocephalic and atraumatic.     Right Ear: Hearing, tympanic membrane and ear canal normal.     Left Ear: Hearing, tympanic membrane and ear canal normal.  Eyes:     Conjunctiva/sclera: Conjunctivae normal.  Neck:     Thyroid: No thyromegaly.     Vascular: No carotid bruit or JVD.  Cardiovascular:     Rate and Rhythm: Normal rate and regular rhythm.     Heart sounds: Normal heart sounds. No murmur heard.   Pulmonary:     Effort: Pulmonary effort is normal. No respiratory distress.     Breath sounds: Normal breath sounds. No wheezing or rales.  Chest:     Chest wall: No tenderness.  Musculoskeletal:     Cervical back: Normal range of motion and neck supple.  Neurological:     Mental Status: She is alert and oriented to person, place, and time.    BP 140/60 (BP Location: Right Arm, Patient Position: Sitting, Cuff Size: Normal)   Pulse 81   Temp 98.8 F (37.1 C) (Oral)   Resp 18   Ht 5\' 5"  (1.651 m)   Wt 155 lb 9.6 oz (70.6 kg)   LMP 04/01/2016   SpO2 99%   BMI 25.89 kg/m  Wt Readings from Last 3 Encounters:  04/03/20 155 lb 9.6 oz (70.6 kg)  03/27/20 157 lb 3.2  oz (71.3 kg)  12/08/19 160 lb (72.6 kg)     Lab Results  Component Value Date   WBC 2.2 (L) 04/03/2020   HGB 11.9 04/03/2020   HCT 37.1 04/03/2020   PLT 136 (L) 04/03/2020   GLUCOSE 70 04/03/2020   CHOL 158 03/27/2020   TRIG 90.0 03/27/2020   HDL 60.70 03/27/2020   LDLCALC 79 03/27/2020   ALT 19 04/03/2020   AST 20 04/03/2020   NA 141 04/03/2020   K 4.2 04/03/2020   CL 103 04/03/2020   CREATININE 0.94 04/03/2020   BUN 16 04/03/2020   CO2 28 04/03/2020   TSH 0.38 (L) 04/03/2020   INR 1.1 03/15/2008   HGBA1C 5.3 09/05/2016   MICROALBUR <0.2 09/22/2014     DG Chest 2 View  Result Date: 03/27/2020 CLINICAL DATA:  Chest pain. EXAM: CHEST - 2 VIEW COMPARISON:  04/15/2007. FINDINGS: Mediastinum hilar structures normal. Mild cardiomegaly. No pulmonary venous congestion. No focal infiltrate. Mild biapical pleural thickening again noted consistent scarring. No pleural effusion or pneumothorax. Degenerative change thoracic spine. IMPRESSION: Mild cardiomegaly. No pulmonary venous congestion. No acute pulmonary disease. Electronically Signed   By: [place]   On: 03/27/2020 17:00     Assessment & Plan:  Plan  I am having Ayelet Loux start on escitalopram and meclizine. I am also having her maintain her multivitamin and pregabalin. We will continue to administer triamcinolone acetonide.  Meds ordered this encounter  Medications  . escitalopram (LEXAPRO) 10 MG tablet    Sig: Take 1 tablet (10 mg total) by mouth at bedtime.    Dispense:  30 tablet    Refill:  2  . meclizine (ANTIVERT) 25 MG tablet    Sig: Take 1 tablet (25 mg total) by mouth 3 (three) times daily as needed for dizziness.    Dispense:  30 tablet    Refill:  0    Problem List Items Addressed This Visit      Unprioritized   Chest pressure    Improved but not completely + palpitations also Cardiology w/u neg cxr normal  Pt also c/o some sob---  Check spirometry  Referral to pulm for spirometry      Depression with anxiety    Start lexapro  F/u 1 month or sooner prn       Relevant Medications   escitalopram (LEXAPRO) 10 MG tablet   Other Relevant Orders   CBC with Differential/Platelet (Completed)   TSH (Completed)   Vitamin B12 (Completed)   Comprehensive metabolic panel (Completed)   POCT Urinalysis Dipstick (Automated)   Dizziness    Check labs  Cardiology w/u normal       Relevant Medications   meclizine (ANTIVERT) 25 MG tablet   Other Relevant Orders   CBC with Differential/Platelet (Completed)   TSH (Completed)   Vitamin B12 (Completed)    Comprehensive metabolic panel (Completed)   POCT Urinalysis Dipstick (Automated)   CT Head Wo Contrast (Completed)   Other fatigue - Primary    Check labs again       Relevant Orders   CBC with Differential/Platelet (Completed)   TSH (Completed)   Vitamin B12 (Completed)   Comprehensive metabolic panel (Completed)   POCT Urinalysis Dipstick (Automated)      Follow-up: Return in about 4 weeks (around 05/01/2020), or if symptoms worsen or fail to improve, for anxiety.  [name], DO

## 2020-04-04 ENCOUNTER — Other Ambulatory Visit: Payer: Self-pay | Admitting: Family Medicine

## 2020-04-04 DIAGNOSIS — R0789 Other chest pain: Secondary | ICD-10-CM

## 2020-04-04 DIAGNOSIS — E0590 Thyrotoxicosis, unspecified without thyrotoxic crisis or storm: Secondary | ICD-10-CM

## 2020-04-04 DIAGNOSIS — D72819 Decreased white blood cell count, unspecified: Secondary | ICD-10-CM

## 2020-04-04 LAB — CBC WITH DIFFERENTIAL/PLATELET
Absolute Monocytes: 301 cells/uL (ref 200–950)
Basophils Absolute: 20 cells/uL (ref 0–200)
Basophils Relative: 0.9 %
Eosinophils Absolute: 31 cells/uL (ref 15–500)
Eosinophils Relative: 1.4 %
HCT: 37.1 % (ref 35.0–45.0)
Hemoglobin: 11.9 g/dL (ref 11.7–15.5)
Lymphs Abs: 603 cells/uL — ABNORMAL LOW (ref 850–3900)
MCH: 27.2 pg (ref 27.0–33.0)
MCHC: 32.1 g/dL (ref 32.0–36.0)
MCV: 84.7 fL (ref 80.0–100.0)
Monocytes Relative: 13.7 %
Neutro Abs: 1245 cells/uL — ABNORMAL LOW (ref 1500–7800)
Neutrophils Relative %: 56.6 %
Platelets: 136 10*3/uL — ABNORMAL LOW (ref 140–400)
RBC: 4.38 10*6/uL (ref 3.80–5.10)
RDW: 12.8 % (ref 11.0–15.0)
Total Lymphocyte: 27.4 %
WBC: 2.2 10*3/uL — ABNORMAL LOW (ref 3.8–10.8)

## 2020-04-04 LAB — COMPREHENSIVE METABOLIC PANEL
AG Ratio: 1.3 (calc) (ref 1.0–2.5)
ALT: 19 U/L (ref 6–29)
AST: 20 U/L (ref 10–35)
Albumin: 4.4 g/dL (ref 3.6–5.1)
Alkaline phosphatase (APISO): 51 U/L (ref 37–153)
BUN: 16 mg/dL (ref 7–25)
CO2: 28 mmol/L (ref 20–32)
Calcium: 9.5 mg/dL (ref 8.6–10.4)
Chloride: 103 mmol/L (ref 98–110)
Creat: 0.94 mg/dL (ref 0.50–0.99)
Globulin: 3.4 g/dL (calc) (ref 1.9–3.7)
Glucose, Bld: 70 mg/dL (ref 65–99)
Potassium: 4.2 mmol/L (ref 3.5–5.3)
Sodium: 141 mmol/L (ref 135–146)
Total Bilirubin: 0.5 mg/dL (ref 0.2–1.2)
Total Protein: 7.8 g/dL (ref 6.1–8.1)

## 2020-04-04 LAB — TSH: TSH: 0.38 mIU/L — ABNORMAL LOW (ref 0.40–4.50)

## 2020-04-04 LAB — VITAMIN B12: Vitamin B-12: 747 pg/mL (ref 200–1100)

## 2020-04-05 ENCOUNTER — Ambulatory Visit: Payer: BC Managed Care – PPO | Admitting: Family Medicine

## 2020-04-06 ENCOUNTER — Ambulatory Visit (HOSPITAL_BASED_OUTPATIENT_CLINIC_OR_DEPARTMENT_OTHER)
Admission: RE | Admit: 2020-04-06 | Discharge: 2020-04-06 | Disposition: A | Discharge status: Home / Self Care | Payer: BC Managed Care – PPO | Source: Ambulatory Visit | Attending: Family Medicine | Admitting: Family Medicine

## 2020-04-06 ENCOUNTER — Other Ambulatory Visit: Payer: Self-pay

## 2020-04-06 DIAGNOSIS — R42 Dizziness and giddiness: Secondary | ICD-10-CM | POA: Diagnosis not present

## 2020-04-08 DIAGNOSIS — R5383 Other fatigue: Secondary | ICD-10-CM | POA: Insufficient documentation

## 2020-04-08 DIAGNOSIS — R42 Dizziness and giddiness: Secondary | ICD-10-CM | POA: Insufficient documentation

## 2020-04-08 NOTE — Assessment & Plan Note (Signed)
Check labs  Cardiology w/u normal

## 2020-04-08 NOTE — Assessment & Plan Note (Signed)
Start lexapro  F/u 1 month or sooner prn

## 2020-04-08 NOTE — Assessment & Plan Note (Signed)
Check labs again

## 2020-04-08 NOTE — Assessment & Plan Note (Signed)
Improved but not completely + palpitations also Cardiology w/u neg cxr normal  Pt also c/o some sob---  Check spirometry  Referral to pulm for spirometry

## 2020-04-09 ENCOUNTER — Encounter: Payer: Self-pay | Admitting: Family Medicine

## 2020-04-09 ENCOUNTER — Telehealth: Payer: Self-pay | Admitting: Family

## 2020-04-09 NOTE — Telephone Encounter (Signed)
lmom to inform of new patient appointment 9/29 at 1 pm.

## 2020-04-09 NOTE — Telephone Encounter (Signed)
Need more info-- she did not mention it at her ov and she has only lost 5 lbs in the last 5 months

## 2020-04-10 ENCOUNTER — Other Ambulatory Visit: Payer: Self-pay

## 2020-04-10 ENCOUNTER — Ambulatory Visit (AMBULATORY_SURGERY_CENTER): Payer: Self-pay

## 2020-04-10 VITALS — Ht 65.0 in | Wt 154.2 lb

## 2020-04-10 DIAGNOSIS — Z1211 Encounter for screening for malignant neoplasm of colon: Secondary | ICD-10-CM

## 2020-04-10 MED ORDER — NA SULFATE-K SULFATE-MG SULF 17.5-3.13-1.6 GM/177ML PO SOLN: 1.0000 | Freq: Once | ORAL | 0 refills | Status: AC

## 2020-04-10 NOTE — Progress Notes (Signed)
No allergies to soy or egg Pt is not on blood thinners or diet pills Denies issues with sedation/intubation Denies atrial flutter/fib Denies constipation   Pt is aware of Covid safety and care partner requirements.      

## 2020-04-10 NOTE — Telephone Encounter (Signed)
It may be ----- we have referred to endo -- but I asked for app to be here and they set it up in [place]--- that needs to change

## 2020-04-10 NOTE — Telephone Encounter (Signed)
Pt called today wanting to know who is going to find out [place] out why she is losing weight.  Pt stated she is not dieting and is eating as normal.  Pt states she has lost 3 more pounds since her last OV.  Per pt, this is concerning to her and maybe she needs her thyroid checked.

## 2020-04-11 ENCOUNTER — Encounter: Payer: Self-pay | Admitting: Gastroenterology

## 2020-04-12 ENCOUNTER — Other Ambulatory Visit: Payer: Self-pay

## 2020-04-12 ENCOUNTER — Ambulatory Visit: Payer: BC Managed Care – PPO | Admitting: Family Medicine

## 2020-04-12 ENCOUNTER — Encounter: Payer: Self-pay | Admitting: Pulmonary Disease

## 2020-04-12 ENCOUNTER — Ambulatory Visit (INDEPENDENT_AMBULATORY_CARE_PROVIDER_SITE_OTHER): Payer: BC Managed Care – PPO | Admitting: Pulmonary Disease

## 2020-04-12 VITALS — BP 140/68 | HR 59 | Temp 97.6°F | Ht 65.0 in | Wt 153.2 lb

## 2020-04-12 DIAGNOSIS — R0602 Shortness of breath: Secondary | ICD-10-CM | POA: Diagnosis not present

## 2020-04-12 NOTE — Patient Instructions (Signed)
--  We will arrange for Pulmonary function tests  Follow-up with me or NP

## 2020-04-12 NOTE — Addendum Note (Signed)
Addended by: Gavin Potters R on: 04/12/2020 11:16 AM   Modules accepted: Orders

## 2020-04-12 NOTE — Progress Notes (Signed)
Subjective:   PATIENT ID: Jackie Casey GENDER: female DOB: 02-07-1961, MRN: [id number]   HPI  Chief Complaint  Patient presents with  . Consult    SOB, chest pain X 2.5 weeks     Reason for Visit: New consult for shortness of breath  [name] is a 59 year old female never smoker with medical history only significant for trigemina neuralgia who presents with shortness of breath.   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 She also noticed elevated blood pressure (149/78) and/or palpitations with these activities. In the last few weeks, she also reports fatigue and dizziness occurring after exertion. Family history with asthma (mother) but patient has never had any respiratory issues  Her prior work-up was a stress test for chest pain which was abnormal. Per 12/08/19 Cardiology note the stress test was abnormal with ST depressions but able to perform 12.5 METs. Angiography was discussed and deferred. CT Coronary in July 2021 was normal.  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  Past Medical History:  Diagnosis Date  . Anemia    slight per pt.  . Depression   . GERD (gastroesophageal reflux disease)    in past per pt.  . Hypertension   . Thyroid disease    slightly low.  . Trigeminal neuralgia      Family History  Problem Relation Age of Onset  . Stroke Mother        stroke  . Asthma Mother   . Hypertension Mother   . Colon cancer Neg Hx   . Colon polyps Neg Hx   . Esophageal cancer Neg Hx   . Rectal cancer Neg Hx   . Stomach cancer Neg Hx      Social History   Occupational History  . Occupation: bus Geophysical data processor: [employer]  Tobacco Use  . Smoking status: Never Smoker  .  Smokeless tobacco: Never Used  . Tobacco comment: NEVER USED TOBACCO  Vaping Use  . Vaping Use: Never used  Substance and Sexual Activity  . Alcohol use: Yes    Alcohol/week: 0.0 standard drinks    Comment: rare--  1-2 x a month  . Drug use: No  . Sexual activity: Yes    Partners: Male    Birth control/protection: None    No Known Allergies   Outpatient Medications Prior to Visit  Medication Sig Dispense Refill  . Ascorbic Acid (VITAMIN C PO) Take by mouth.    . Multiple Vitamin (MULTIVITAMIN) tablet Take 1 tablet by mouth daily.      . pregabalin (LYRICA) 100 MG capsule Take 1 capsule (100 mg total) by mouth 2 (two) times daily. 180 capsule 3  . escitalopram (LEXAPRO) 10 MG tablet Take 1 tablet (10 mg total) by mouth at bedtime. (Patient not taking: Reported on 04/12/2020) 30 tablet 2  . meclizine (ANTIVERT) 25 MG tablet Take 1 tablet (25 mg total) by mouth 3 (three) times daily as needed for dizziness. (Patient not taking: Reported on 04/10/2020) 30 tablet 0   Facility-Administered Medications Prior to Visit  Medication Dose Route Frequency Provider Last Rate Last Admin  . triamcinolone acetonide (KENALOG-40) injection 40 mg  40 mg Intramuscular Once [name], MD        Review  of Systems  Constitutional: Positive for weight loss. Negative for chills, diaphoresis, fever and malaise/fatigue.  HENT: Negative for congestion, ear pain and sore throat.   Respiratory: Positive for shortness of breath. Negative for cough, hemoptysis, sputum production and wheezing.   Cardiovascular: Negative for chest pain, palpitations and leg swelling.  Gastrointestinal: Positive for heartburn. Negative for abdominal pain and nausea.  Genitourinary: Negative for frequency.  Musculoskeletal: Negative for joint pain and myalgias.  Skin: Negative for itching and rash.  Neurological: Negative for dizziness, weakness and headaches.  Endo/Heme/Allergies: Does not bruise/bleed easily.    Psychiatric/Behavioral: Negative for depression. The patient is not nervous/anxious.      Objective:   Vitals:   04/12/20 1024  BP: 140/68  Pulse: (!) 59  Temp: 97.6 F (36.4 C)  TempSrc: Temporal  SpO2: 99%  Weight: 153 lb 3.2 oz (69.5 kg)  Height: 5\' 5"  (1.651 m)   SpO2: 99 % O2 Device: None (Room air)  Physical Exam: General: Well-appearing, no acute distress HENT: [place], AT, OP clear, MMM Eyes: EOMI, no scleral icterus Respiratory: Clear to auscultation bilaterally.  No crackles, wheezing or rales Cardiovascular: RRR, -M/R/G, no JVD GI: BS+, soft, nontender Extremities:-Edema,-tenderness Neuro: AAO x4, CNII-XII grossly intact Skin: Intact, no rashes or bruising Psych: Normal mood, normal affect  Data Reviewed:  Imaging: CXR 03/27/20 - No acute cardiopulmonary abnormalities, no infiltrate effusion or edema CT Coronary 02/27/20 - On review of lung fields. Normal parenchyma and airways   PFT: None on file  Labs: CBC    Component Value Date/Time   WBC 2.2 (L) 04/03/2020 1429   RBC 4.38 04/03/2020 1429   HGB 11.9 04/03/2020 1429   HGB 10.7 (L) 10/16/2014 1528   HGB 12.0 05/15/2008 1558   HCT 37.1 04/03/2020 1429   HCT 32.8 (L) 10/16/2014 1528   HCT 35.4 05/15/2008 1558   PLT 136 (L) 04/03/2020 1429   PLT 105 (L) 10/16/2014 1528   PLT 142 (L) 05/15/2008 1558   MCV 84.7 04/03/2020 1429   MCV 87 10/16/2014 1528   MCV 86.5 05/15/2008 1558   MCH 27.2 04/03/2020 1429   MCHC 32.1 04/03/2020 1429   RDW 12.8 04/03/2020 1429   RDW 12.8 10/16/2014 1528   RDW 13.9 05/15/2008 1558   LYMPHSABS 603 (L) 04/03/2020 1429   LYMPHSABS 0.6 (L) 10/16/2014 1528   LYMPHSABS 0.9 05/15/2008 1558   MONOABS 0.2 03/27/2020 1136   MONOABS 0.3 05/15/2008 1558   EOSABS 31 04/03/2020 1429   EOSABS 0.0 10/16/2014 1528   BASOSABS 20 04/03/2020 1429   BASOSABS 0.0 10/16/2014 1528   BASOSABS 0.0 05/15/2008 1558   BMET    Component Value Date/Time   NA 141 04/03/2020 1429   NA  141 02/24/2020 1147   K 4.2 04/03/2020 1429   CL 103 04/03/2020 1429   CO2 28 04/03/2020 1429   GLUCOSE 70 04/03/2020 1429   BUN 16 04/03/2020 1429   BUN 13 02/24/2020 1147   CREATININE 0.94 04/03/2020 1429   CALCIUM 9.5 04/03/2020 1429   GFRNONAA 61 02/24/2020 1147   GFRAA 71 02/24/2020 1147        Assessment & Plan:   Discussion: 59 year old female who presents with shortness of breath with exertion x 2 weeks. She has had prior cardiac work-up with abnormal stress but negative CT coronary. She has no significant smoking or environmental exposures to suggest underlying lung disease or reactive airway issues. Will obtain PFTs to rule out obstructive or restrictive process. Depending  on the results, we will consider bronchodilators and/or chest imaging if indicated.  --We will arrange for Pulmonary function tests (spirometry with B&A)  Follow-up with me or NP   Health Maintenance Immunization History  Administered Date(s) Administered  . Influenza Split 07/11/2013  . Influenza Whole 05/31/2010  . Influenza,inj,Quad PF,6+ Mos 09/22/2014, 09/02/2016, 08/29/2019  . PFIZER SARS-COV-2 Vaccination 10/08/2019, 10/29/2019  . Td 09/09/1999  . Tdap 12/02/2010   CT Lung Screen - not indicated  No orders of the defined types were placed in this encounter. No orders of the defined types were placed in this encounter.   Return for after PFTs.  I have spent a total time of 45-minutes on the day of the appointment reviewing prior documentation, coordinating care and discussing medical diagnosis and plan with the patient/family. Imaging, labs and tests included in this note have been reviewed and interpreted independently by me.  [place], MD [facility] 04/12/2020 11:06 AM  Office Number [phone]

## 2020-04-24 ENCOUNTER — Encounter: Payer: Self-pay | Admitting: Internal Medicine

## 2020-04-24 ENCOUNTER — Ambulatory Visit: Payer: BC Managed Care – PPO | Admitting: Internal Medicine

## 2020-04-24 ENCOUNTER — Other Ambulatory Visit: Payer: Self-pay

## 2020-04-24 VITALS — BP 130/70 | HR 62 | Ht 65.0 in | Wt 154.6 lb

## 2020-04-24 DIAGNOSIS — E0590 Thyrotoxicosis, unspecified without thyrotoxic crisis or storm: Secondary | ICD-10-CM | POA: Diagnosis not present

## 2020-04-24 NOTE — Progress Notes (Signed)
Name: Jackie Casey  MRN/ DOB: [id number], 09-May-1961    Age/ Sex: 59 y.o., female     PCP: [name], DO   Reason for Endocrinology Evaluation: Hyperthyroidism     Initial Endocrinology Clinic Visit: 04/24/2020    PATIENT IDENTIFIER: [name] is a 59 y.o., female with a past medical history of trigeminal neuralgia , HTN and depresison . She has followed with [facility] since 04/24/2020 for consultative assistance with management of her Hyperthyroidism.   HISTORICAL SUMMARY: The patient has been noted with low TSH in 11/2017 with resolution until 03/2020 with a TSH of 0.38 uIU/mL . At the time she was having dizziness, palpitations and sob as well as depression and anxiety, as well as weight loss.    Of note, the pt has chronic leukopenia and thrombocytopenia since 2008. She has been evaluated by hem/onc in 2016 with recommendations to observe at the time   SUBJECTIVE:    Today (04/24/2020):  Ms. [name] is here for a follow up on subclinical hyperthyroidism.    She was noted with weight loss,hair loss and rash over the arm and scalp. The rash has been improving and attributes this to taking MVI.   Since being on MVI has noted improved in hr symptoms . No biotin intake    Denies local neck symptoms  She does not recall an acute viral infection , just a " not good feeling"  No prior exposure to radiation    Hem/Onc appointment end of this Month         HISTORY:  Past Medical History:  Past Medical History:  Diagnosis Date  . Anemia    slight per pt.  . Depression   . GERD (gastroesophageal reflux disease)    in past per pt.  . Hypertension   . Thyroid disease    slightly low.  . Trigeminal neuralgia    Past Surgical History:  Past Surgical History:  Procedure Laterality Date  . COLONOSCOPY  2011  . INDUCED ABORTION    . TONSILLECTOMY      Social History:  reports that she has never smoked. She has never used smokeless  tobacco. She reports current alcohol use. She reports that she does not use drugs. Family History:  Family History  Problem Relation Age of Onset  . Stroke Mother        stroke  . Asthma Mother   . Hypertension Mother   . Colon cancer Neg Hx   . Colon polyps Neg Hx   . Esophageal cancer Neg Hx   . Rectal cancer Neg Hx   . Stomach cancer Neg Hx      HOME MEDICATIONS: Allergies as of 04/24/2020   No Known Allergies     Medication List       Accurate as of April 24, 2020  2:04 PM. If you have any questions, ask your nurse or doctor.        escitalopram 10 MG tablet Commonly known as: Lexapro Take 1 tablet (10 mg total) by mouth at bedtime.   meclizine 25 MG tablet Commonly known as: ANTIVERT Take 1 tablet (25 mg total) by mouth 3 (three) times daily as needed for dizziness.   multivitamin tablet Take 1 tablet by mouth daily.   pregabalin 100 MG capsule Commonly known as: Lyrica Take 1 capsule (100 mg total) by mouth 2 (two) times daily.   VITAMIN C PO Take by mouth.  OBJECTIVE:   PHYSICAL EXAM: VS: BP 130/70 (BP Location: Left Arm, Patient Position: Sitting, Cuff Size: Normal)   Pulse 62   Ht 5\' 5"  (1.651 m)   Wt 154 lb 9.6 oz (70.1 kg)   LMP 04/01/2016   SpO2 97%   BMI 25.73 kg/m    EXAM: General: Pt appears well and is in NAD  Neck: General: Supple without adenopathy. Thyroid: Thyroid size normal.  No goiter or nodules appreciated. No thyroid bruit.  Lungs: Clear with good BS bilat with no rales, rhonchi, or wheezes  Heart: Auscultation: RRR.  Abdomen: Normoactive bowel sounds, soft, nontender, without masses or organomegaly palpable  Extremities:  BL LE: No pretibial edema normal ROM and strength.  Skin: Hair: Texture and amount normal with gender appropriate distribution Skin Inspection: No rashes Skin Palpation: Skin temperature, texture, and thickness normal to palpation  Neuro: Cranial nerves: II - XII grossly intact  Motor:  Normal strength throughout DTRs: 2+ and symmetric in UE without delay in relaxation phase  Mental Status: Judgment, insight: Intact Orientation: Oriented to time, place, and person Mood and affect: No depression, anxiety, or agitation     DATA REVIEWED: Results for ZAMIA, TYMINSKI (MRN [id number]) as of 04/25/2020 12:39  Ref. Range 04/03/2020 14:29 04/24/2020 14:00  TSH Latest Ref Range: 0.40 - 4.50 mIU/L 0.38 (L) 0.38 (L)  Triiodothyronine (T3) Latest Ref Range: 76 - 181 ng/dL  88  T4,Free(Direct) Latest Ref Range: 0.8 - 1.8 ng/dL  1.0      ASSESSMENT / PLAN / RECOMMENDATIONS:   1. Subclinical hyperthyroidism:   - It seems that initially she had symptoms consistent with hyperthyroidism but recently these symptoms have been improving.   - We discussed D/D essay interference, subacute thyroiditis, euthyroid sick syndrome, graves' disease vs autonomous nodule(s)   -We discussed with pt the benefits of methimazole in the Tx of hyperthyroidism, as well as the possible side effects/complications of anti-thyroid drug Tx (specifically detailing the rare, but serious side effect of agranulocytosis). Which I would be VERY RELUCTANT to start on her given her low baseline WBC/neutrophil count .  - We extensively discussed the various treatment options for hyperthyroidism including ablation therapy with radioactive iodine versus antithyroid drug treatment versus surgical therapy.   - I carefully explained to the patient that one of the consequences of I-131 ablation treatment would likely be permanent hypothyroidism which would require long-term replacement therapy with LT4.   - Repeat TFT's show stable low TSH with normal FT4 and T3. No intervention will be offered at this time, will monitor.     F/U in 4 months    Signed electronically by: Mack Guise, MD  [facility]  [address] [facility] [address] [place], [place] [address] Phone:  [phone] FAX: [phone]      CC: [address] [place] [address] Phone: [phone]  Fax: [phone]   Return to Endocrinology clinic as below: Future Appointments  Date Time Provider [place]  04/27/2020 11:30 AM Lavena Bullion, DO [facility]  05/01/2020 10:00 AM [name], DO [facility]  05/08/2020 10:30 AM [facility] None  05/08/2020 11:00 AM Parrett, Fonnie Mu, NP [facility] None  05/11/2020 10:30 AM [facility] None  05/11/2020 11:00 AM Ennever, Rudell Cobb, MD [facility] None

## 2020-04-24 NOTE — Patient Instructions (Signed)
Labs today

## 2020-04-26 LAB — T3: T3, Total: 88 ng/dL (ref 76–181)

## 2020-04-26 LAB — TRAB (TSH RECEPTOR BINDING ANTIBODY): TRAB: <1 IU/L (ref <=2.00)

## 2020-04-26 LAB — T4, FREE: Free T4: 1 ng/dL (ref 0.8–1.8)

## 2020-04-26 LAB — TSH: TSH: 0.38 mIU/L — ABNORMAL LOW (ref 0.40–4.50)

## 2020-04-27 ENCOUNTER — Ambulatory Visit (AMBULATORY_SURGERY_CENTER): Payer: BC Managed Care – PPO | Admitting: Gastroenterology

## 2020-04-27 ENCOUNTER — Other Ambulatory Visit: Payer: Self-pay

## 2020-04-27 ENCOUNTER — Encounter: Payer: Self-pay | Admitting: Gastroenterology

## 2020-04-27 VITALS — BP 91/64 | HR 51 | Temp 98.4°F | Resp 12 | Ht 65.0 in | Wt 154.0 lb

## 2020-04-27 DIAGNOSIS — Z1211 Encounter for screening for malignant neoplasm of colon: Secondary | ICD-10-CM

## 2020-04-27 DIAGNOSIS — D122 Benign neoplasm of ascending colon: Secondary | ICD-10-CM | POA: Diagnosis not present

## 2020-04-27 DIAGNOSIS — K635 Polyp of colon: Secondary | ICD-10-CM

## 2020-04-27 MED ORDER — SODIUM CHLORIDE 0.9 % IV SOLN: 500.0000 mL | Freq: Once | INTRAVENOUS | Status: DC

## 2020-04-27 NOTE — Progress Notes (Signed)
Pt's states no medical or surgical changes since previsit or office visit. VS by SP

## 2020-04-27 NOTE — Op Note (Signed)
[facility] Patient Name: Jackie Casey Procedure Date: 04/27/2020 11:04 AM MRN: [id number] Endoscopist: Gerrit Heck , MD Age: 59 Referring MD:  Date of Birth: 16-May-1961 Gender: Female Account #: [id number] Procedure:                Colonoscopy Indications:              Screening for colorectal malignant neoplasm (last                            colonoscopy was 10 years ago) Medicines:                Monitored Anesthesia Care Procedure:                Pre-Anesthesia Assessment:                           - Prior to the procedure, a History and Physical                            was performed, and patient medications and                            allergies were reviewed. The patient's tolerance of                            previous anesthesia was also reviewed. The risks                            and benefits of the procedure and the sedation                            options and risks were discussed with the patient.                            All questions were answered, and informed consent                            was obtained. Prior Anticoagulants: The patient has                            taken no previous anticoagulant or antiplatelet                            agents. ASA Grade Assessment: II - A patient with                            mild systemic disease. After reviewing the risks                            and benefits, the patient was deemed in                            satisfactory condition to undergo the procedure.  After obtaining informed consent, the colonoscope                            was passed under direct vision. Throughout the                            procedure, the patient's blood pressure, pulse, and                            oxygen saturations were monitored continuously. The                            Colonoscope was introduced through the anus and                            advanced to the the cecum,  identified by                            appendiceal orifice and ileocecal valve. The                            colonoscopy was performed without difficulty. The                            patient tolerated the procedure well. The quality                            of the bowel preparation was good. The ileocecal                            valve, appendiceal orifice, and rectum were                            photographed. Scope In: 11:29:11 AM Scope Out: 11:46:51 AM Scope Withdrawal Time: 0 hours 13 minutes 33 seconds  Total Procedure Duration: 0 hours 17 minutes 40 seconds  Findings:                 The perianal and digital rectal examinations were                            normal.                           A 4 mm polyp was found in the ascending colon. The                            polyp was sessile. The polyp was removed with a                            cold snare. Resection and retrieval were complete.                            Estimated blood loss was minimal.  The exam was otherwise normal throughout the                            remainder of the colon.                           The retroflexed view of the distal rectum and anal                            verge was normal and showed no anal or rectal                            abnormalities. Complications:            No immediate complications. Estimated Blood Loss:     Estimated blood loss was minimal. Impression:               - One 4 mm polyp in the ascending colon, removed                            with a cold snare. Resected and retrieved.                           - The distal rectum and anal verge are normal on                            retroflexion view. Recommendation:           - Patient has a contact number available for                            emergencies. The signs and symptoms of potential                            delayed complications were discussed with the                             patient. Return to normal activities tomorrow.                            Written discharge instructions were provided to the                            patient.                           - Resume previous diet.                           - Await pathology results.                           - Repeat colonoscopy in 5-10 years for surveillance                            based on pathology results.                           -  Return to GI office PRN. Gerrit Heck, MD 04/27/2020 11:50:48 AM

## 2020-04-27 NOTE — Patient Instructions (Signed)
Impression/Recommendations:  Polyp handout given to patient.  Resume previous diet. Await pathology results.  RepeatYOU HAD AN ENDOSCOPIC PROCEDURE TODAY AT THE [facility]:   Refer to the procedure report that was given to you for any specific questions about what was found during the examination.  If the procedure report does not answer your questions, please call your gastroenterologist to clarify.  If you requested that your care partner not be given the details of your procedure findings, then the procedure report has been included in a sealed envelope for you to review at your convenience later.  YOU SHOULD EXPECT: Some feelings of bloating in the abdomen. Passage of more gas than usual.  Walking can help get rid of the air that was put into your GI tract during the procedure and reduce the bloating. If you had a lower endoscopy (such as a colonoscopy or flexible sigmoidoscopy) you may notice spotting of blood in your stool or on the toilet paper. If you underwent a bowel prep for your procedure, you may not have a normal bowel movement for a few days.  Please Note:  You might notice some irritation and congestion in your nose or some drainage.  This is from the oxygen used during your procedure.  There is no need for concern and it should clear up in a day or so.  SYMPTOMS TO REPORT IMMEDIATELY:   Following lower endoscopy (colonoscopy or flexible sigmoidoscopy):  Excessive amounts of blood in the stool  Significant tenderness or worsening of abdominal pains  Swelling of the abdomen that is new, acute  Fever of 100F or higher For urgent or emergent issues, a gastroenterologist can be reached at any hour by calling [phone]. Do not use MyChart messaging for urgent concerns.    DIET:  We do recommend a small meal at first, but then you may proceed to your regular diet.  Drink plenty of fluids but you should avoid alcoholic beverages for 24 hours.  ACTIVITY:  You  should plan to take it easy for the rest of today and you should NOT DRIVE or use heavy machinery until tomorrow (because of the sedation medicines used during the test).    FOLLOW UP: Our staff will call the number listed on your records 48-72 hours following your procedure to check on you and address any questions or concerns that you may have regarding the information given to you following your procedure. If we do not reach you, we will leave a message.  We will attempt to reach you two times.  During this call, we will ask if you have developed any symptoms of COVID 19. If you develop any symptoms (ie: fever, flu-like symptoms, shortness of breath, cough etc.) before then, please call [phone].  If you test positive for Covid 19 in the 2 weeks post procedure, please call and report this information to [place].    If any biopsies were taken you will be contacted by phone or by letter within the next 1-3 weeks.  Please call us at [phone] if you have not heard about the biopsies in 3 weeks.    SIGNATURES/CONFIDENTIALITY: You and/or your care partner have signed paperwork which will be entered into your electronic medical record.  These signatures attest to the fact that that the information above on your After Visit Summary has been reviewed and is understood.  Full responsibility of the confidentiality of this discharge information lies with you and/or your care-partner. colonoscopy in 5-10 years for  surveillance.  Date to be determined after pathology results reviewed.

## 2020-04-27 NOTE — Progress Notes (Signed)
Called to room to assist during endoscopic procedure.  Patient ID and intended procedure confirmed with present staff. Received instructions for my participation in the procedure from the performing physician.  

## 2020-04-27 NOTE — Progress Notes (Signed)
A and O x3. Report to RN. Tolerated MAC anesthesia well.

## 2020-05-01 ENCOUNTER — Telehealth: Payer: Self-pay | Admitting: *Deleted

## 2020-05-01 ENCOUNTER — Other Ambulatory Visit: Payer: Self-pay

## 2020-05-01 ENCOUNTER — Ambulatory Visit (INDEPENDENT_AMBULATORY_CARE_PROVIDER_SITE_OTHER): Payer: BC Managed Care – PPO | Admitting: Family Medicine

## 2020-05-01 ENCOUNTER — Encounter: Payer: Self-pay | Admitting: Family Medicine

## 2020-05-01 ENCOUNTER — Telehealth: Payer: Self-pay

## 2020-05-01 DIAGNOSIS — F418 Other specified anxiety disorders: Secondary | ICD-10-CM

## 2020-05-01 NOTE — Patient Instructions (Signed)

## 2020-05-01 NOTE — Assessment & Plan Note (Signed)
Improved without meds

## 2020-05-01 NOTE — Progress Notes (Signed)
Patient ID: Jackie Casey, female    DOB: [birth year]  Age: 59 y.o. MRN: [id number]    Subjective:  Subjective  HPI Jackie Casey presents for f/u anxiety and sob.   Pt has started taking her vitamins and relaxing more and she is feeling better.     Review of Systems  Constitutional: Negative for appetite change, diaphoresis, fatigue and unexpected weight change.  Eyes: Negative for pain, redness and visual disturbance.  Respiratory: Negative for cough, chest tightness, shortness of breath and wheezing.   Cardiovascular: Negative for chest pain, palpitations and leg swelling.  Endocrine: Negative for cold intolerance, heat intolerance, polydipsia, polyphagia and polyuria.  Genitourinary: Negative for difficulty urinating, dysuria and frequency.  Neurological: Negative for dizziness, light-headedness, numbness and headaches.    History Past Medical History:  Diagnosis Date  . Anemia    slight per pt.  . Depression   . GERD (gastroesophageal reflux disease)    in past per pt.  . Hypertension   . Thyroid disease    slightly low.  . Trigeminal neuralgia     She has a past surgical history that includes Tonsillectomy; Induced abortion; and Colonoscopy (2011).   Her family history includes Asthma in her mother; Hypertension in her mother; Stroke in her mother.She reports that she has never smoked. She has never used smokeless tobacco. She reports current alcohol use. She reports that she does not use drugs.  Current Outpatient Medications on File Prior to Visit  Medication Sig Dispense Refill  . Multiple Vitamin (MULTIVITAMIN) tablet Take 1 tablet by mouth daily.      . pregabalin (LYRICA) 100 MG capsule Take 1 capsule (100 mg total) by mouth 2 (two) times daily. 180 capsule 3   Current Facility-Administered Medications on File Prior to Visit  Medication Dose Route Frequency Provider Last Rate Last Admin  . triamcinolone acetonide (KENALOG-40) injection 40 mg  40 mg Intramuscular  Once [name], MD         Objective:  Objective  Physical Exam Vitals and nursing note reviewed.  Constitutional:      Appearance: She is well-developed.  HENT:     Head: Normocephalic and atraumatic.  Eyes:     Conjunctiva/sclera: Conjunctivae normal.  Neck:     Thyroid: No thyromegaly.     Vascular: No carotid bruit or JVD.  Cardiovascular:     Rate and Rhythm: Normal rate and regular rhythm.     Heart sounds: Normal heart sounds. No murmur heard.   Pulmonary:     Effort: Pulmonary effort is normal. No respiratory distress.     Breath sounds: Normal breath sounds. No wheezing or rales.  Chest:     Chest wall: No tenderness.  Musculoskeletal:     Cervical back: Normal range of motion and neck supple.  Neurological:     Mental Status: She is alert and oriented to person, place, and time.    BP 110/70 (BP Location: Right Arm, Patient Position: Sitting, Cuff Size: Normal)   Pulse (!) 58   Temp 98.2 F (36.8 C) (Oral)   Resp 18   Ht 5\' 5"  (1.651 m)   Wt 155 lb 12.8 oz (70.7 kg)   LMP 04/01/2016   SpO2 98%   BMI 25.93 kg/m  Wt Readings from Last 3 Encounters:  05/01/20 155 lb 12.8 oz (70.7 kg)  04/27/20 154 lb (69.9 kg)  04/24/20 154 lb 9.6 oz (70.1 kg)     Lab Results  Component Value Date   WBC  2.2 (L) 04/03/2020   HGB 11.9 04/03/2020   HCT 37.1 04/03/2020   PLT 136 (L) 04/03/2020   GLUCOSE 70 04/03/2020   CHOL 158 03/27/2020   TRIG 90.0 03/27/2020   HDL 60.70 03/27/2020   LDLCALC 79 03/27/2020   ALT 19 04/03/2020   AST 20 04/03/2020   NA 141 04/03/2020   K 4.2 04/03/2020   CL 103 04/03/2020   CREATININE 0.94 04/03/2020   BUN 16 04/03/2020   CO2 28 04/03/2020   TSH 0.38 (L) 04/24/2020   INR 1.1 03/15/2008   HGBA1C 5.3 09/05/2016   MICROALBUR <0.2 09/22/2014    CT Head Wo Contrast  Result Date: 04/06/2020 CLINICAL DATA:  Dizziness. EXAM: CT HEAD WITHOUT CONTRAST TECHNIQUE: Contiguous axial images were obtained from the base of the skull  through the vertex without intravenous contrast. COMPARISON:  None. FINDINGS: Brain: No evidence of acute infarction, hemorrhage, hydrocephalus, extra-axial collection or mass lesion/mass effect. Vascular: No hyperdense vessel or unexpected calcification. Skull: Normal. Negative for fracture or focal lesion. Sinuses/Orbits: No acute finding. Other: None. IMPRESSION: No acute intracranial pathology. Electronically Signed   By: Virgina Norfolk M.D.   On: 04/06/2020 15:49     Assessment & Plan:  Plan  I have discontinued Jackie Casey's meclizine and Ascorbic Acid (VITAMIN C PO). I am also having her maintain her multivitamin and pregabalin. We will continue to administer triamcinolone acetonide.  No orders of the defined types were placed in this encounter.   Problem List Items Addressed This Visit    None      Follow-up: Return in about 6 months (around 10/29/2020), or if symptoms worsen or fail to improve, for annual exam, fasting.  [name], DO

## 2020-05-01 NOTE — Telephone Encounter (Signed)
No answer, left message to call back later today, B.Lataja Newland RN. 

## 2020-05-01 NOTE — Telephone Encounter (Signed)
°  Follow up Call-  Call back number 04/27/2020  Post procedure Call Back phone  # [phone]  Permission to leave phone message Yes  Some recent data might be hidden     LMOM to call back with any questions or concerns.  Also, call back if patient has developed fever, respiratory issues or been dx with COVID or had any family members or close contacts diagnosed since her procedure.

## 2020-05-03 ENCOUNTER — Encounter: Payer: Self-pay | Admitting: Gastroenterology

## 2020-05-08 ENCOUNTER — Ambulatory Visit: Payer: BC Managed Care – PPO | Admitting: Adult Health

## 2020-05-08 ENCOUNTER — Ambulatory Visit (INDEPENDENT_AMBULATORY_CARE_PROVIDER_SITE_OTHER): Payer: BC Managed Care – PPO | Admitting: Pulmonary Disease

## 2020-05-08 ENCOUNTER — Encounter: Payer: Self-pay | Admitting: Pulmonary Disease

## 2020-05-08 ENCOUNTER — Other Ambulatory Visit: Payer: Self-pay

## 2020-05-08 VITALS — BP 120/60 | HR 53 | Temp 97.3°F | Ht 65.0 in | Wt 155.0 lb

## 2020-05-08 DIAGNOSIS — R0602 Shortness of breath: Secondary | ICD-10-CM

## 2020-05-08 DIAGNOSIS — Z0000 Encounter for general adult medical examination without abnormal findings: Secondary | ICD-10-CM | POA: Insufficient documentation

## 2020-05-08 LAB — PULMONARY FUNCTION TEST
FEF 25-75 Post: 2.54 L/sec
FEF 25-75 Pre: 2.28 L/sec
FEF2575-%Change-Post: 11 %
FEF2575-%Pred-Post: 118 %
FEF2575-%Pred-Pre: 106 %
FEV1-%Change-Post: 2 %
FEV1-%Pred-Post: 111 %
FEV1-%Pred-Pre: 108 %
FEV1-Post: 2.43 L
FEV1-Pre: 2.37 L
FEV1FVC-%Change-Post: 0 %
FEV1FVC-%Pred-Pre: 100 %
FEV6-%Change-Post: 2 %
FEV6-%Pred-Post: 114 %
FEV6-%Pred-Pre: 111 %
FEV6-Post: 3.06 L
FEV6-Pre: 2.98 L
FEV6FVC-%Pred-Post: 103 %
FEV6FVC-%Pred-Pre: 103 %
FVC-%Change-Post: 2 %
FVC-%Pred-Post: 110 %
FVC-%Pred-Pre: 107 %
FVC-Post: 3.06 L
FVC-Pre: 2.98 L
Post FEV1/FVC ratio: 79 %
Post FEV6/FVC ratio: 100 %
Pre FEV1/FVC ratio: 80 %
Pre FEV6/FVC Ratio: 100 %

## 2020-05-08 NOTE — Progress Notes (Signed)
Spirometry before and after performed today.

## 2020-05-08 NOTE — Assessment & Plan Note (Signed)
Offered seasonal flu vaccine today, patient would like to receive this in October/2021

## 2020-05-08 NOTE — Progress Notes (Signed)
@Patient  ID: Jackie Casey, female    DOB: 05-Aug-1961, 59 y.o.   MRN: [id number]  Chief Complaint  Patient presents with  . Follow-up    SOB    Referring provider: [name], *  HPI:  60 year old female never smoker followed in our office for shortness of breath  PMH: Trigeminal neuralgia, palpitations, atypical chest pain, dizziness Smoker/ Smoking History: Never smoker  maintenance: None Pt of: Dr. Loanne Drilling  05/08/2020  - Visit   59 year old female never smoker followed in our office for shortness of breath.  Patient was last seen in September/2021 by Dr. Loanne Drilling.  Plan of care from September/2021 office visit was for the patient to obtain pulmonary function testing.  Those results are listed below:  05/08/2020-pulmonary function test-FVC 2.98 (107% addicted), postbronchodilator ratio 79, postbronchodilator FEV1 2.43 (111% predicted), no bronchodilator response  Patient reports that her breathing has improved since last being seen by Dr. Loanne Drilling.  She attributes this to better management of her thyroid levels by endocrinology.  She also reports that her blood levels have been stable.  She feels that she is having less shortness of breath.  Patient denies any previous history of childhood asthma, no significant childhood respiratory illness, no previous exposures of any sort of dust or environmental exposure.  Questionaires / Pulmonary Flowsheets:   ACT:  No flowsheet data found.  MMRC: mMRC Dyspnea Scale mMRC Score  05/08/2020 0    Epworth:  No flowsheet data found.  Tests:   Imaging: CXR 03/27/20 - No acute cardiopulmonary abnormalities, no infiltrate effusion or edema CT Coronary 02/27/20 - On review of lung fields. Normal parenchyma and airways     FENO:  No results found for: NITRICOXIDE  PFT: PFT Results Latest Ref Rng & Units 05/08/2020  FVC-Pre L 2.98  FVC-Predicted Pre % 107  FVC-Post L 3.06  FVC-Predicted Post % 110  Pre FEV1/FVC % % 80   Post FEV1/FCV % % 79  FEV1-Pre L 2.37  FEV1-Predicted Pre % 108  FEV1-Post L 2.43    WALK:  No flowsheet data found.  Imaging: No results found.  Lab Results:  CBC    Component Value Date/Time   WBC 2.2 (L) 04/03/2020 1429   RBC 4.38 04/03/2020 1429   HGB 11.9 04/03/2020 1429   HGB 10.7 (L) 10/16/2014 1528   HGB 12.0 05/15/2008 1558   HCT 37.1 04/03/2020 1429   HCT 32.8 (L) 10/16/2014 1528   HCT 35.4 05/15/2008 1558   PLT 136 (L) 04/03/2020 1429   PLT 105 (L) 10/16/2014 1528   PLT 142 (L) 05/15/2008 1558   MCV 84.7 04/03/2020 1429   MCV 87 10/16/2014 1528   MCV 86.5 05/15/2008 1558   MCH 27.2 04/03/2020 1429   MCHC 32.1 04/03/2020 1429   RDW 12.8 04/03/2020 1429   RDW 12.8 10/16/2014 1528   RDW 13.9 05/15/2008 1558   LYMPHSABS 603 (L) 04/03/2020 1429   LYMPHSABS 0.6 (L) 10/16/2014 1528   LYMPHSABS 0.9 05/15/2008 1558   MONOABS 0.2 03/27/2020 1136   MONOABS 0.3 05/15/2008 1558   EOSABS 31 04/03/2020 1429   EOSABS 0.0 10/16/2014 1528   BASOSABS 20 04/03/2020 1429   BASOSABS 0.0 10/16/2014 1528   BASOSABS 0.0 05/15/2008 1558    BMET    Component Value Date/Time   NA 141 04/03/2020 1429   NA 141 02/24/2020 1147   K 4.2 04/03/2020 1429   CL 103 04/03/2020 1429   CO2 28 04/03/2020 1429   GLUCOSE 70  04/03/2020 1429   BUN 16 04/03/2020 1429   BUN 13 02/24/2020 1147   CREATININE 0.94 04/03/2020 1429   CALCIUM 9.5 04/03/2020 1429   GFRNONAA 61 02/24/2020 1147   GFRAA 71 02/24/2020 1147    BNP No results found for: BNP  ProBNP No results found for: PROBNP  Specialty Problems      Pulmonary Problems   Sinus headache   Shortness of breath      No Known Allergies  Immunization History  Administered Date(s) Administered  . Influenza Split 07/11/2013  . Influenza Whole 05/31/2010  . Influenza,inj,Quad PF,6+ Mos 09/22/2014, 09/02/2016, 08/29/2019  . PFIZER SARS-COV-2 Vaccination 10/08/2019, 10/29/2019  . Td 09/09/1999  . Tdap 12/02/2010     Past Medical History:  Diagnosis Date  . Anemia    slight per pt.  . Depression   . GERD (gastroesophageal reflux disease)    in past per pt.  . Hypertension   . Thyroid disease    slightly low.  . Trigeminal neuralgia     Tobacco History: Social History   Tobacco Use  Smoking Status Never Smoker  Smokeless Tobacco Never Used  Tobacco Comment   NEVER USED TOBACCO   Counseling given: Not Answered Comment: NEVER USED TOBACCO   Continue to not smoke  Outpatient Encounter Medications as of 05/08/2020  Medication Sig  . Multiple Vitamin (MULTIVITAMIN) tablet Take 1 tablet by mouth daily.    . pregabalin (LYRICA) 100 MG capsule Take 1 capsule (100 mg total) by mouth 2 (two) times daily.   Facility-Administered Encounter Medications as of 05/08/2020  Medication  . triamcinolone acetonide (KENALOG-40) injection 40 mg     Review of Systems  Review of Systems  Constitutional: Negative for activity change, fatigue and fever.  HENT: Negative for sinus pressure, sinus pain and sore throat.   Respiratory: Negative for cough, shortness of breath and wheezing.   Cardiovascular: Negative for chest pain and palpitations.  Gastrointestinal: Negative for diarrhea, nausea and vomiting.  Musculoskeletal: Negative for arthralgias.  Neurological: Negative for dizziness.  Psychiatric/Behavioral: Negative for sleep disturbance. The patient is not nervous/anxious.      Physical Exam  BP 120/60 (BP Location: Left Arm, Cuff Size: Normal)   Pulse (!) 53   Temp (!) 97.3 F (36.3 C) (Oral)   Ht 5\' 5"  (1.651 m)   Wt 155 lb (70.3 kg)   LMP 04/01/2016   SpO2 100%   BMI 25.79 kg/m   Wt Readings from Last 5 Encounters:  05/08/20 155 lb (70.3 kg)  05/01/20 155 lb 12.8 oz (70.7 kg)  04/27/20 154 lb (69.9 kg)  04/24/20 154 lb 9.6 oz (70.1 kg)  04/12/20 153 lb 3.2 oz (69.5 kg)    BMI Readings from Last 5 Encounters:  05/08/20 25.79 kg/m  05/01/20 25.93 kg/m  04/27/20 25.63  kg/m  04/24/20 25.73 kg/m  04/12/20 25.49 kg/m     Physical Exam Vitals and nursing note reviewed.  Constitutional:      General: She is not in acute distress.    Appearance: Normal appearance. She is normal weight.  HENT:     Head: Normocephalic and atraumatic.     Right Ear: External ear normal.     Left Ear: External ear normal.     Nose: Nose normal. No congestion.     Mouth/Throat:     Mouth: Mucous membranes are moist.     Pharynx: Oropharynx is clear.  Eyes:     Pupils: Pupils are equal, round, and reactive  to light.  Cardiovascular:     Rate and Rhythm: Normal rate and regular rhythm.     Pulses: Normal pulses.     Heart sounds: Normal heart sounds. No murmur heard.   Pulmonary:     Effort: Pulmonary effort is normal. No respiratory distress.     Breath sounds: Normal breath sounds. No decreased air movement. No decreased breath sounds, wheezing or rales.  Musculoskeletal:     Cervical back: Normal range of motion.  Skin:    General: Skin is warm and dry.     Capillary Refill: Capillary refill takes less than 2 seconds.  Neurological:     General: No focal deficit present.     Mental Status: She is alert and oriented to person, place, and time. Mental status is at baseline.     Gait: Gait normal.  Psychiatric:        Mood and Affect: Mood normal.        Behavior: Behavior normal.        Thought Content: Thought content normal.        Judgment: Judgment normal.       Assessment & Plan:   Shortness of breath Reviewed pulmonary function testing today with patient She reports that her shortness of breath has improved since last being seen.  She attributes her shortness of breath symptoms improving due to improved management of her thyroid  Stable exam today  Plan: We will continue to clinically monitor Patient to follow-up with our office in 1 year or sooner if shortness of breath or symptoms worsen   Healthcare maintenance Offered seasonal flu  vaccine today, patient would like to receive this in October/2021    Return in about 1 year (around 05/08/2021), or if symptoms worsen or fail to improve, for Follow up with Dr. Loanne Drilling.   Lauraine Rinne, NP 05/08/2020   This appointment required 24 minutes of patient care (this includes precharting, chart review, review of results, face-to-face care, etc.).

## 2020-05-08 NOTE — Patient Instructions (Addendum)
You were seen today by Lauraine Rinne, NP  for:   1. Shortness of breath  Follow-up with our office in 1 year or if your shortness of breath worsens  Your breathing test today was normal this is reassuring  2. Healthcare maintenance  We recommend the flu vaccine today, you wanted to wait   Follow Up:    Return in about 1 year (around 05/08/2021), or if symptoms worsen or fail to improve, for Follow up with Dr. Loanne Drilling.   Notification of test results are managed in the following manner: If there are  any recommendations or changes to the  plan of care discussed in office today,  we will contact you and let you know what they are. If you do not hear from [place], then your results are normal and you can view them through your  MyChart account , or a letter will be sent to you. Thank you again for trusting [place] with your care  - Thank you, [facility]    It is flu season:   >>> Best ways to protect herself from the flu: Receive the yearly flu vaccine, practice good hand hygiene washing with soap and also using hand sanitizer when available, eat a nutritious meals, get adequate rest, hydrate appropriately       Please contact the office if your symptoms worsen or you have concerns that you are not improving.   Thank you for choosing [facility] for your healthcare, and for allowing [place] to partner with you on your healthcare journey. I am thankful to be able to provide care to you today.   Wyn Quaker FNP-C

## 2020-05-08 NOTE — Assessment & Plan Note (Signed)
Reviewed pulmonary function testing today with patient She reports that her shortness of breath has improved since last being seen.  She attributes her shortness of breath symptoms improving due to improved management of her thyroid  Stable exam today  Plan: We will continue to clinically monitor Patient to follow-up with our office in 1 year or sooner if shortness of breath or symptoms worsen

## 2020-05-09 ENCOUNTER — Ambulatory Visit: Payer: BC Managed Care – PPO | Admitting: Family

## 2020-05-09 ENCOUNTER — Inpatient Hospital Stay: Payer: BC Managed Care – PPO

## 2020-05-10 ENCOUNTER — Telehealth: Payer: Self-pay | Admitting: Hematology and Oncology

## 2020-05-10 NOTE — Telephone Encounter (Signed)
A new hem referral has been fwd to our office from [facility] for leukopenia. [name] cld to schedule a new hem appt w/Dr. Alvy Bimler on 10/6 at 11:20am. Pt aware toa rrive 30 minutes early.

## 2020-05-11 ENCOUNTER — Inpatient Hospital Stay: Payer: BC Managed Care – PPO

## 2020-05-11 ENCOUNTER — Inpatient Hospital Stay: Payer: BC Managed Care – PPO | Admitting: Hematology & Oncology

## 2020-05-16 ENCOUNTER — Inpatient Hospital Stay: Payer: BC Managed Care – PPO | Attending: Hematology and Oncology | Admitting: Hematology and Oncology

## 2020-05-16 ENCOUNTER — Inpatient Hospital Stay: Payer: BC Managed Care – PPO

## 2020-05-16 ENCOUNTER — Other Ambulatory Visit: Payer: Self-pay

## 2020-05-16 ENCOUNTER — Encounter: Payer: Self-pay | Admitting: Hematology and Oncology

## 2020-05-16 DIAGNOSIS — D72819 Decreased white blood cell count, unspecified: Secondary | ICD-10-CM | POA: Diagnosis not present

## 2020-05-16 DIAGNOSIS — R21 Rash and other nonspecific skin eruption: Secondary | ICD-10-CM

## 2020-05-16 DIAGNOSIS — R591 Generalized enlarged lymph nodes: Secondary | ICD-10-CM | POA: Insufficient documentation

## 2020-05-16 DIAGNOSIS — D696 Thrombocytopenia, unspecified: Secondary | ICD-10-CM

## 2020-05-16 DIAGNOSIS — E042 Nontoxic multinodular goiter: Secondary | ICD-10-CM | POA: Insufficient documentation

## 2020-05-16 DIAGNOSIS — R590 Localized enlarged lymph nodes: Secondary | ICD-10-CM | POA: Insufficient documentation

## 2020-05-16 LAB — CBC WITH DIFFERENTIAL/PLATELET
Abs Immature Granulocytes: 0 10*3/uL (ref 0.00–0.07)
Basophils Absolute: 0 10*3/uL (ref 0.0–0.1)
Basophils Relative: 1 %
Eosinophils Absolute: 0 10*3/uL (ref 0.0–0.5)
Eosinophils Relative: 2 %
HCT: 36.8 % (ref 36.0–46.0)
Hemoglobin: 11.7 g/dL — ABNORMAL LOW (ref 12.0–15.0)
Immature Granulocytes: 0 %
Lymphocytes Relative: 39 %
Lymphs Abs: 0.7 10*3/uL (ref 0.7–4.0)
MCH: 27.6 pg (ref 26.0–34.0)
MCHC: 31.8 g/dL (ref 30.0–36.0)
MCV: 86.8 fL (ref 80.0–100.0)
Monocytes Absolute: 0.2 10*3/uL (ref 0.1–1.0)
Monocytes Relative: 13 %
Neutro Abs: 0.9 10*3/uL — ABNORMAL LOW (ref 1.7–7.7)
Neutrophils Relative %: 45 %
Platelets: 125 10*3/uL — ABNORMAL LOW (ref 150–400)
RBC: 4.24 MIL/uL (ref 3.87–5.11)
RDW: 13.5 % (ref 11.5–15.5)
WBC: 1.9 10*3/uL — ABNORMAL LOW (ref 4.0–10.5)
nRBC: 0 % (ref 0.0–0.2)

## 2020-05-16 LAB — SEDIMENTATION RATE: Sed Rate: 22 mm/hr (ref 0–22)

## 2020-05-16 LAB — SAVE SMEAR(SSMR), FOR PROVIDER SLIDE REVIEW

## 2020-05-16 NOTE — Assessment & Plan Note (Signed)
She has chronic leukopenia since 2008 I suspect it could be due to her African-American heritage Numerous different studies have been done in the past including screening for viral illness, vitamin B12 deficiency and others One of her autoimmune screen came back positive several years ago I plan to repeat that She is not symptomatic Her peripheral smear revealed normal morphology WBC Observe for now

## 2020-05-16 NOTE — Assessment & Plan Note (Signed)
She has nonspecific skin rash Autoimmune disorder cannot be excluded, especially given her positive ANA in the past, chronic leukopenia and thrombocytopenia I will order another ANA screen

## 2020-05-16 NOTE — Progress Notes (Signed)
[facility] CONSULT NOTE  Patient Care Team: Carollee Herter, Alferd Apa, DO as PCP - General Buford Dresser, MD as PCP - Cardiology (Cardiology) [name], MD as Consulting Physician (Dermatology)  CHIEF COMPLAINTS/PURPOSE OF CONSULTATION:  Chronic leukopenia and thrombocytopenia  HISTORY OF PRESENTING ILLNESS:  Jackie Casey 59 y.o. female is here because of chronic leukopenia and thrombocytopenia.  The patient was evaluated extensively in the past She saw Dr. Marin Olp several years ago She was being observed She was found to have abnormal CBC from abnormal blood work I have had opportunity to review his CBC all the way to 2008 Her white blood cell count fluctuated between 1.6-3.3 Her platelet count fluctuated between 70,000 -158,000 She denies recent bruising/bleeding, such as spontaneous epistaxis, hematuria, melena or hematochezia She denies recent excessive menorrhagia The patient denies history of liver disease, exposure to heparin, history of cardiac murmur/prior cardiovascular surgery or recent new medications She denies prior blood or platelet transfusions The reason she is seen today is because she has nonspecific symptoms of weakness and dizziness She has occasional intermittent skin rash in her elbow and scalp She has alopecia and use Rogaine intermittently She denies regular alcohol use No family history of abnormal blood work or malignancies She denies history of recurrent infection from leukopenia She was found to have hyperactive thyroid.  Her endocrinologist wanted to start her on medications but is concerned about her pancytopenia The patient denies any recent signs or symptoms of bleeding such as spontaneous epistaxis, hematuria or hematochezia.   MEDICAL HISTORY:  Past Medical History:  Diagnosis Date  . Anemia    slight per pt.  . Depression   . GERD (gastroesophageal reflux disease)    in past per pt.  . Hypertension   . Thyroid  disease    slightly low.  . Trigeminal neuralgia     SURGICAL HISTORY: Past Surgical History:  Procedure Laterality Date  . COLONOSCOPY  2011  . INDUCED ABORTION    . TONSILLECTOMY      SOCIAL HISTORY: Social History   Socioeconomic History  . Marital status: Married    Spouse name: Not on file  . Number of children: 3  . Years of education: Not on file  . Highest education level: Not on file  Occupational History  . Occupation: bus Education administrator: [employer]  Tobacco Use  . Smoking status: Never Smoker  . Smokeless tobacco: Never Used  . Tobacco comment: NEVER USED TOBACCO  Vaping Use  . Vaping Use: Never used  Substance and Sexual Activity  . Alcohol use: Yes    Alcohol/week: 0.0 standard drinks    Comment: rare--  1-2 x a month  . Drug use: No  . Sexual activity: Yes    Partners: Male    Birth control/protection: None  Other Topics Concern  . Not on file  Social History Narrative   Exercise-- crunches, jumping jacks etc, twice a week   Social Determinants of Health   Financial Resource Strain:   . Difficulty of Paying Living Expenses: Not on file  Food Insecurity:   . Worried About Charity fundraiser in the Last Year: Not on file  . Ran Out of Food in the Last Year: Not on file  Transportation Needs:   . Lack of Transportation (Medical): Not on file  . Lack of Transportation (Non-Medical): Not on file  Physical Activity:   . Days of Exercise per Week: Not on file  .  Minutes of Exercise per Session: Not on file  Stress:   . Feeling of Stress : Not on file  Social Connections:   . Frequency of Communication with Friends and Family: Not on file  . Frequency of Social Gatherings with Friends and Family: Not on file  . Attends Religious Services: Not on file  . Active Member of Clubs or Organizations: Not on file  . Attends Archivist Meetings: Not on file  . Marital Status: Not on file  Intimate Partner Violence:   . Fear  of Current or Ex-Partner: Not on file  . Emotionally Abused: Not on file  . Physically Abused: Not on file  . Sexually Abused: Not on file    FAMILY HISTORY: Family History  Problem Relation Age of Onset  . Stroke Mother        stroke  . Asthma Mother   . Hypertension Mother   . Colon cancer Neg Hx   . Colon polyps Neg Hx   . Esophageal cancer Neg Hx   . Rectal cancer Neg Hx   . Stomach cancer Neg Hx     ALLERGIES:  has No Known Allergies.  MEDICATIONS:  Current Outpatient Medications  Medication Sig Dispense Refill  . Multiple Vitamin (MULTIVITAMIN) tablet Take 1 tablet by mouth daily.      . pregabalin (LYRICA) 100 MG capsule Take 1 capsule (100 mg total) by mouth 2 (two) times daily. 180 capsule 3   Current Facility-Administered Medications  Medication Dose Route Frequency Provider Last Rate Last Admin  . triamcinolone acetonide (KENALOG-40) injection 40 mg  40 mg Intramuscular Once [name], MD        REVIEW OF SYSTEMS:   Constitutional: Denies fevers, chills or abnormal night sweats Eyes: Denies blurriness of vision, double vision or watery eyes Ears, nose, mouth, throat, and face: Denies mucositis or sore throat Respiratory: Denies cough, dyspnea or wheezes Cardiovascular: Denies palpitation, chest discomfort or lower extremity swelling Gastrointestinal:  Denies nausea, heartburn or change in bowel habits Lymphatics: Denies new lymphadenopathy or easy bruising Neurological:Denies numbness, tingling or new weaknesses Behavioral/Psych: Mood is stable, no new changes  All other systems were reviewed with the patient and are negative.  PHYSICAL EXAMINATION: ECOG PERFORMANCE STATUS: 1 - Symptomatic but completely ambulatory  Vitals:   05/16/20 1121  BP: (!) 155/67  Pulse: 60  Resp: 18  Temp: (!) 97.2 F (36.2 C)  SpO2: 100%   Filed Weights   05/16/20 1121  Weight: 155 lb 12.8 oz (70.7 kg)    GENERAL:alert, no distress and comfortable SKIN: skin  color, texture, turgor are normal, no rashes or significant lesions EYES: normal, conjunctiva are pink and non-injected, sclera clear OROPHARYNX:no exudate, no erythema and lips, buccal mucosa, and tongue normal  NECK: Palpable thyroid enlargement LYMPH: Bilateral lymphadenopathy noted on the cervical region LUNGS: clear to auscultation and percussion with normal breathing effort HEART: regular rate & rhythm and no murmurs and no lower extremity edema ABDOMEN:abdomen soft, non-tender and normal bowel sounds Musculoskeletal:no cyanosis of digits and no clubbing  PSYCH: alert & oriented x 3 with fluent speech NEURO: no focal motor/sensory deficits  LABORATORY DATA:  I have reviewed the data as listed Lab Results  Component Value Date   WBC 1.9 (L) 05/16/2020   HGB 11.7 (L) 05/16/2020   HCT 36.8 05/16/2020   MCV 86.8 05/16/2020   PLT 125 (L) 05/16/2020   I have reviewed her peripheral blood smear Absolute leukopenia is seen but with normal  WBC morphology No evidence of platelet clumping.  The platelets are noted to be slightly enlarged in size  ASSESSMENT & PLAN Chronic leukopenia She has chronic leukopenia since 2008 I suspect it could be due to her African-American heritage Numerous different studies have been done in the past including screening for viral illness, vitamin B12 deficiency and others One of her autoimmune screen came back positive several years ago I plan to repeat that She is not symptomatic Her peripheral smear revealed normal morphology WBC Observe for now  Thrombocytopenia ([place]) She has chronic thrombocytopenia for many years She is not symptomatic Review of her peripheral smear show large platelets I suspect she might have ITP She is not symptomatic Observe for now  Lymphadenopathy of head and neck She has slight palpable enlarged thyroid gland She has small lymph nodes palpable on exam in the cervical region I recommend CT imaging for further  assessment and she is in agreement  Skin rash She has nonspecific skin rash Autoimmune disorder cannot be excluded, especially given her positive ANA in the past, chronic leukopenia and thrombocytopenia I will order another ANA screen   Orders Placed This Encounter  Procedures  . CT Soft Tissue Neck W Contrast    Standing Status:   Future    Standing Expiration Date:   05/16/2021    Order Specific Question:   If indicated for the ordered procedure, I authorize the administration of contrast media per Radiology protocol    Answer:   Yes    Order Specific Question:   Is patient pregnant?    Answer:   No    Order Specific Question:   Preferred imaging location?    Answer:   [facility]  . CBC with Differential/Platelet    Standing Status:   Standing    Number of Occurrences:   22    Standing Expiration Date:   05/16/2021  . Save Smear (SSMR)    Standing Status:   Future    Number of Occurrences:   1    Standing Expiration Date:   05/16/2021  . ANA, IFA (with reflex)    Standing Status:   Future    Number of Occurrences:   1    Standing Expiration Date:   05/16/2021  . Sedimentation rate    Standing Status:   Future    Number of Occurrences:   1    Standing Expiration Date:   05/16/2021   All questions were answered. The patient knows to call the clinic with any problems, questions or concerns. No barriers to learning was detected. The total time spent in the appointment was 55 minutes encounter with patients including review of chart and various tests results, discussions about plan of care and coordination of care plan  Heath Lark, MD 05/16/2020 12:47 PM

## 2020-05-16 NOTE — Assessment & Plan Note (Signed)
She has chronic thrombocytopenia for many years She is not symptomatic Review of her peripheral smear show large platelets I suspect she might have ITP She is not symptomatic Observe for now

## 2020-05-16 NOTE — Assessment & Plan Note (Signed)
She has slight palpable enlarged thyroid gland She has small lymph nodes palpable on exam in the cervical region I recommend CT imaging for further assessment and she is in agreement

## 2020-05-17 LAB — ANTINUCLEAR ANTIBODIES, IFA: ANA Ab, IFA: NEGATIVE

## 2020-05-21 ENCOUNTER — Other Ambulatory Visit: Payer: Self-pay

## 2020-05-21 ENCOUNTER — Telehealth: Payer: Self-pay

## 2020-05-21 DIAGNOSIS — R591 Generalized enlarged lymph nodes: Secondary | ICD-10-CM

## 2020-05-21 NOTE — Telephone Encounter (Signed)
Called and left a message asking her to call the office back to sche[place]le lab work prior to CT appt on 10/13.

## 2020-05-21 NOTE — Telephone Encounter (Signed)
She called back. Scheduled for lab appt tomorrow. She is aware of the time.

## 2020-05-22 ENCOUNTER — Inpatient Hospital Stay: Payer: BC Managed Care – PPO

## 2020-05-22 ENCOUNTER — Other Ambulatory Visit: Payer: Self-pay

## 2020-05-22 DIAGNOSIS — R21 Rash and other nonspecific skin eruption: Secondary | ICD-10-CM

## 2020-05-22 DIAGNOSIS — D72819 Decreased white blood cell count, unspecified: Secondary | ICD-10-CM

## 2020-05-22 DIAGNOSIS — D696 Thrombocytopenia, unspecified: Secondary | ICD-10-CM | POA: Diagnosis not present

## 2020-05-22 DIAGNOSIS — R591 Generalized enlarged lymph nodes: Secondary | ICD-10-CM

## 2020-05-22 LAB — CBC WITH DIFFERENTIAL/PLATELET
Abs Immature Granulocytes: 0 10*3/uL (ref 0.00–0.07)
Basophils Absolute: 0 10*3/uL (ref 0.0–0.1)
Basophils Relative: 0 %
Eosinophils Absolute: 0 10*3/uL (ref 0.0–0.5)
Eosinophils Relative: 1 %
HCT: 35.7 % — ABNORMAL LOW (ref 36.0–46.0)
Hemoglobin: 11.4 g/dL — ABNORMAL LOW (ref 12.0–15.0)
Immature Granulocytes: 0 %
Lymphocytes Relative: 39 %
Lymphs Abs: 0.9 10*3/uL (ref 0.7–4.0)
MCH: 27.5 pg (ref 26.0–34.0)
MCHC: 31.9 g/dL (ref 30.0–36.0)
MCV: 86.2 fL (ref 80.0–100.0)
Monocytes Absolute: 0.2 10*3/uL (ref 0.1–1.0)
Monocytes Relative: 10 %
Neutro Abs: 1.2 10*3/uL — ABNORMAL LOW (ref 1.7–7.7)
Neutrophils Relative %: 50 %
Platelets: 125 10*3/uL — ABNORMAL LOW (ref 150–400)
RBC: 4.14 MIL/uL (ref 3.87–5.11)
RDW: 13.5 % (ref 11.5–15.5)
WBC: 2.3 10*3/uL — ABNORMAL LOW (ref 4.0–10.5)
nRBC: 0 % (ref 0.0–0.2)

## 2020-05-22 LAB — CMP (CANCER CENTER ONLY)
ALT: 27 U/L (ref 0–44)
AST: 24 U/L (ref 15–41)
Albumin: 4 g/dL (ref 3.5–5.0)
Alkaline Phosphatase: 51 U/L (ref 38–126)
Anion gap: 1 — ABNORMAL LOW (ref 5–15)
BUN: 13 mg/dL (ref 6–20)
CO2: 33 mmol/L — ABNORMAL HIGH (ref 22–32)
Calcium: 9.6 mg/dL (ref 8.9–10.3)
Chloride: 103 mmol/L (ref 98–111)
Creatinine: 1 mg/dL (ref 0.44–1.00)
GFR, Estimated: >60 mL/min (ref >60)
Glucose, Bld: 87 mg/dL (ref 70–99)
Potassium: 4.2 mmol/L (ref 3.5–5.1)
Sodium: 137 mmol/L (ref 135–145)
Total Bilirubin: 0.3 mg/dL (ref 0.3–1.2)
Total Protein: 8 g/dL (ref 6.5–8.1)

## 2020-05-23 ENCOUNTER — Ambulatory Visit (HOSPITAL_BASED_OUTPATIENT_CLINIC_OR_DEPARTMENT_OTHER)
Admission: RE | Admit: 2020-05-23 | Discharge: 2020-05-23 | Disposition: A | Discharge status: Home / Self Care | Payer: BC Managed Care – PPO | Source: Ambulatory Visit | Attending: Hematology and Oncology | Admitting: Hematology and Oncology

## 2020-05-23 ENCOUNTER — Encounter (HOSPITAL_BASED_OUTPATIENT_CLINIC_OR_DEPARTMENT_OTHER): Payer: Self-pay

## 2020-05-23 DIAGNOSIS — R591 Generalized enlarged lymph nodes: Secondary | ICD-10-CM | POA: Insufficient documentation

## 2020-05-23 DIAGNOSIS — D72819 Decreased white blood cell count, unspecified: Secondary | ICD-10-CM | POA: Diagnosis present

## 2020-05-23 DIAGNOSIS — R21 Rash and other nonspecific skin eruption: Secondary | ICD-10-CM | POA: Insufficient documentation

## 2020-05-23 MED ORDER — IOHEXOL 300 MG/ML  SOLN
100.0000 mL | Freq: Once | INTRAMUSCULAR | Status: AC | PRN
  Administered 2020-05-23: 75 mL via INTRAVENOUS

## 2020-05-24 ENCOUNTER — Inpatient Hospital Stay (HOSPITAL_BASED_OUTPATIENT_CLINIC_OR_DEPARTMENT_OTHER): Payer: BC Managed Care – PPO | Admitting: Hematology and Oncology

## 2020-05-24 ENCOUNTER — Other Ambulatory Visit: Payer: Self-pay

## 2020-05-24 ENCOUNTER — Encounter: Payer: Self-pay | Admitting: Hematology and Oncology

## 2020-05-24 DIAGNOSIS — D696 Thrombocytopenia, unspecified: Secondary | ICD-10-CM | POA: Diagnosis not present

## 2020-05-24 DIAGNOSIS — R591 Generalized enlarged lymph nodes: Secondary | ICD-10-CM | POA: Diagnosis not present

## 2020-05-24 DIAGNOSIS — D72819 Decreased white blood cell count, unspecified: Secondary | ICD-10-CM | POA: Diagnosis not present

## 2020-05-24 NOTE — Assessment & Plan Note (Signed)
CT revealed thyroid nodules She has seen an endocrinologist recently with plan to start her on medications There is no contraindication for her to proceed as indicated I attempted to call her, unable to get hold of her Will send her a note

## 2020-05-24 NOTE — Assessment & Plan Note (Signed)
She has chronic thrombocytopenia for many years She is not symptomatic Review of her peripheral smear show large platelets I suspect she might have ITP She is not symptomatic Observe for now

## 2020-05-24 NOTE — Progress Notes (Signed)
[facility] OFFICE PROGRESS NOTE  Carollee Herter, Alferd Apa, DO  ASSESSMENT & PLAN:  Chronic leukopenia She has chronic leukopenia since 2008 I suspect it could be due to her African-American heritage Numerous different studies have been done in the past including screening for viral illness, vitamin B12 deficiency and others and were within normal limits She is not symptomatic Her peripheral smear revealed normal morphology WBC Observe for now, she does not need long term follow-up  Thrombocytopenia ([place]) She has chronic thrombocytopenia for many years She is not symptomatic Review of her peripheral smear show large platelets I suspect she might have ITP She is not symptomatic Observe for now  Lymphadenopathy of head and neck CT revealed thyroid nodules She has seen an endocrinologist recently with plan to start her on medications There is no contraindication for her to proceed as indicated I attempted to call her, unable to get hold of her Will send her a note   No orders of the defined types were placed in this encounter.   The total time spent in the appointment was 20 minutes encounter with patients including review of chart and various tests results, discussions about plan of care and coordination of care plan   All questions were answered. The patient knows to call the clinic with any problems, questions or concerns. No barriers to learning was detected.    Heath Lark, MD 10/14/202112:44 PM  INTERVAL HISTORY: [name] 59 y.o. female returns for follow-up on abnormal CBC She feels well No new symptoms SUMMARY OF HEMATOLOGIC HISTORY: [place] 59 y.o. female is here because of chronic leukopenia and thrombocytopenia.  The patient was evaluated extensively in the past She saw Dr. Marin Olp several years ago She was being observed She was found to have abnormal CBC from abnormal blood work I have had opportunity to review his CBC all the way  to 2008 Her white blood cell count fluctuated between 1.6-3.3 Her platelet count fluctuated between 70,000 -158,000 She denies recent bruising/bleeding, such as spontaneous epistaxis, hematuria, melena or hematochezia She denies recent excessive menorrhagia The patient denies history of liver disease, exposure to heparin, history of cardiac murmur/prior cardiovascular surgery or recent new medications She denies prior blood or platelet transfusions The reason she is seen today is because she has nonspecific symptoms of weakness and dizziness She has occasional intermittent skin rash in her elbow and scalp She has alopecia and use Rogaine intermittently She denies regular alcohol use No family history of abnormal blood work or malignancies She denies history of recurrent infection from leukopenia She was found to have hyperactive thyroid.  Her endocrinologist wanted to start her on medications but is concerned about her pancytopenia The patient denies any recent signs or symptoms of bleeding such as spontaneous epistaxis, hematuria or hematochezia. Repeat work-up and autoimmune screen is negative CT scanning of the neck showed thyroid nodules  I have reviewed the past medical history, past surgical history, social history and family history with the patient and they are unchanged from previous note.  ALLERGIES:  has No Known Allergies.  MEDICATIONS:  Current Outpatient Medications  Medication Sig Dispense Refill  . Multiple Vitamin (MULTIVITAMIN) tablet Take 1 tablet by mouth daily.      . pregabalin (LYRICA) 100 MG capsule Take 1 capsule (100 mg total) by mouth 2 (two) times daily. 180 capsule 3   Current Facility-Administered Medications  Medication Dose Route Frequency Provider Last Rate Last Admin  . triamcinolone acetonide (KENALOG-40) injection 40 mg  40 mg Intramuscular Once [name], MD         REVIEW OF SYSTEMS:   Constitutional: Denies fevers, chills or night  sweats Eyes: Denies blurriness of vision Ears, nose, mouth, throat, and face: Denies mucositis or sore throat Respiratory: Denies cough, dyspnea or wheezes Cardiovascular: Denies palpitation, chest discomfort or lower extremity swelling Gastrointestinal:  Denies nausea, heartburn or change in bowel habits Skin: Denies abnormal skin rashes Lymphatics: Denies new lymphadenopathy or easy bruising Neurological:Denies numbness, tingling or new weaknesses Behavioral/Psych: Mood is stable, no new changes  All other systems were reviewed with the patient and are negative.  PHYSICAL EXAMINATION: ECOG PERFORMANCE STATUS: 0 - Asymptomatic  Vitals:   05/24/20 1112  BP: (!) 144/65  Pulse: 66  Resp: 18  Temp: (!) 97.5 F (36.4 C)  SpO2: 100%   Filed Weights   05/24/20 1112  Weight: 151 lb 9.6 oz (68.8 kg)    GENERAL:alert, no distress and comfortable NEURO: alert & oriented x 3 with fluent speech, no focal motor/sensory deficits  LABORATORY DATA:  I have reviewed the data as listed     Component Value Date/Time   NA 137 05/22/2020 1017   NA 141 02/24/2020 1147   K 4.2 05/22/2020 1017   CL 103 05/22/2020 1017   CO2 33 (H) 05/22/2020 1017   GLUCOSE 87 05/22/2020 1017   BUN 13 05/22/2020 1017   BUN 13 02/24/2020 1147   CREATININE 1.00 05/22/2020 1017   CREATININE 0.94 04/03/2020 1429   CALCIUM 9.6 05/22/2020 1017   PROT 8.0 05/22/2020 1017   ALBUMIN 4.0 05/22/2020 1017   AST 24 05/22/2020 1017   ALT 27 05/22/2020 1017   ALKPHOS 51 05/22/2020 1017   BILITOT 0.3 05/22/2020 1017   GFRNONAA >60 05/22/2020 1017   GFRAA 71 02/24/2020 1147    No results found for: SPEP, UPEP  Lab Results  Component Value Date   WBC 2.3 (L) 05/22/2020   NEUTROABS 1.2 (L) 05/22/2020   HGB 11.4 (L) 05/22/2020   HCT 35.7 (L) 05/22/2020   MCV 86.2 05/22/2020   PLT 125 (L) 05/22/2020      Chemistry      Component Value Date/Time   NA 137 05/22/2020 1017   NA 141 02/24/2020 1147   K 4.2  05/22/2020 1017   CL 103 05/22/2020 1017   CO2 33 (H) 05/22/2020 1017   BUN 13 05/22/2020 1017   BUN 13 02/24/2020 1147   CREATININE 1.00 05/22/2020 1017   CREATININE 0.94 04/03/2020 1429      Component Value Date/Time   CALCIUM 9.6 05/22/2020 1017   ALKPHOS 51 05/22/2020 1017   AST 24 05/22/2020 1017   ALT 27 05/22/2020 1017   BILITOT 0.3 05/22/2020 1017       RADIOGRAPHIC STUDIES: I have reviewed CT imaging with the patient- I have personally reviewed the radiological images as listed and agreed with the findings in the report. CT Soft Tissue Neck W Contrast  Result Date: 05/23/2020 CLINICAL DATA:  Lymphadenopathy of the neck. EXAM: CT NECK WITH CONTRAST TECHNIQUE: Multidetector CT imaging of the neck was performed using the standard protocol following the bolus administration of intravenous contrast. CONTRAST:  40mL OMNIPAQUE IOHEXOL 300 MG/ML  SOLN COMPARISON:  None. FINDINGS: Pharynx and larynx: Normal. No mass or swelling. Salivary glands: No inflammation, mass, or stone. Thyroid: Multiple hypodense thyroid nodules, the largest in the left lobe measuring approximately 1.4 cm. Lymph nodes: None enlarged or abnormal density. Vascular: Negative. Limited intracranial: Negative.  Visualized orbits: Negative. Mastoids and visualized paranasal sinuses: Clear. Skeleton: No acute or aggressive process. Upper chest: Negative. Other: None. IMPRESSION: 1. No neck mass or adenopathy. 2. Multiple hypodense thyroid nodules, the largest in the left lobe measuring approximately 1.4 cm. Recommend thyroid ultrasound for further evaluation. Electronically Signed   By: Pedro Earls M.D.   On: 05/23/2020 13:52

## 2020-05-24 NOTE — Assessment & Plan Note (Signed)
She has chronic leukopenia since 2008 I suspect it could be due to her African-American heritage Numerous different studies have been done in the past including screening for viral illness, vitamin B12 deficiency and others and were within normal limits She is not symptomatic Her peripheral smear revealed normal morphology WBC Observe for now, she does not need long term follow-up

## 2020-05-31 ENCOUNTER — Ambulatory Visit (INDEPENDENT_AMBULATORY_CARE_PROVIDER_SITE_OTHER): Payer: BC Managed Care – PPO

## 2020-05-31 ENCOUNTER — Other Ambulatory Visit: Payer: Self-pay

## 2020-05-31 DIAGNOSIS — Z23 Encounter for immunization: Secondary | ICD-10-CM

## 2020-06-05 ENCOUNTER — Other Ambulatory Visit: Payer: Self-pay

## 2020-06-05 ENCOUNTER — Ambulatory Visit: Payer: BC Managed Care – PPO | Admitting: Internal Medicine

## 2020-06-05 ENCOUNTER — Encounter: Payer: Self-pay | Admitting: Internal Medicine

## 2020-06-05 VITALS — BP 134/84 | HR 60 | Ht 65.0 in | Wt 154.4 lb

## 2020-06-05 DIAGNOSIS — E042 Nontoxic multinodular goiter: Secondary | ICD-10-CM

## 2020-06-05 DIAGNOSIS — E0590 Thyrotoxicosis, unspecified without thyrotoxic crisis or storm: Secondary | ICD-10-CM | POA: Diagnosis not present

## 2020-06-05 LAB — TSH: TSH: 0.56 mIU/L (ref 0.40–4.50)

## 2020-06-05 LAB — T4, FREE: Free T4: 1 ng/dL (ref 0.8–1.8)

## 2020-06-05 NOTE — Progress Notes (Signed)
Name: Jackie Casey  MRN/ DOB: [id number], 07-24-1961    Age/ Sex: 59 y.o., female     PCP: [name], DO   Reason for Endocrinology Evaluation: Hyperthyroidism     Initial Endocrinology Clinic Visit: 04/24/2020    PATIENT IDENTIFIER: Jackie Casey is a 59 y.o., female with a past medical history of trigeminal neuralgia , HTN and depresison . She has followed with [facility] since 04/24/2020 for consultative assistance with management of her Hyperthyroidism.   HISTORICAL SUMMARY: The patient has been noted with low TSH in 11/2017 with resolution until 03/2020 with a TSH of 0.38 uIU/mL . At the time she was having dizziness, palpitations and sob as well as depression and anxiety, as well as weight loss.    Of note, the pt has chronic leukopenia and thrombocytopenia since 2008. She has been evaluated by hem/onc in 2016 with recommendations to observe at the time . Re-evaluated 05/2020 suggested related to heritage with no contraindication to treat with RAI if needed.   SUBJECTIVE:    Today (06/05/2020):  Jackie Casey is here for a follow up on subclinical hyperthyroidism.   She endorses improved hair loss and energy that she attributes to taking vitamins  Weigh has been stable  She is concerned about her hear despite resolution of palpitations and negative cardiac work up  Denies local neck symptoms     HISTORY:  Past Medical History:  Past Medical History:  Diagnosis Date  . Anemia    slight per pt.  . Depression   . GERD (gastroesophageal reflux disease)    in past per pt.  . Hypertension   . Thyroid disease    slightly low.  . Trigeminal neuralgia    Past Surgical History:  Past Surgical History:  Procedure Laterality Date  . COLONOSCOPY  2011  . INDUCED ABORTION    . TONSILLECTOMY      Social History:  reports that she has never smoked. She has never used smokeless tobacco. She reports current alcohol use. She reports that she does  not use drugs. Family History:  Family History  Problem Relation Age of Onset  . Stroke Mother        stroke  . Asthma Mother   . Hypertension Mother   . Colon cancer Neg Hx   . Colon polyps Neg Hx   . Esophageal cancer Neg Hx   . Rectal cancer Neg Hx   . Stomach cancer Neg Hx      HOME MEDICATIONS: Allergies as of 06/05/2020   No Known Allergies     Medication List       Accurate as of June 05, 2020 10:22 AM. If you have any questions, ask your nurse or doctor.        multivitamin tablet Take 1 tablet by mouth daily.   pregabalin 100 MG capsule Commonly known as: Lyrica Take 1 capsule (100 mg total) by mouth 2 (two) times daily.         OBJECTIVE:   PHYSICAL EXAM: VS: BP 134/84   Pulse 60   Ht 5\' 5"  (1.651 m)   Wt 154 lb 6 oz (70 kg)   LMP 04/01/2016   SpO2 98%   BMI 25.69 kg/m    EXAM: General: Pt appears well and is in NAD  Neck: General: Supple without adenopathy. Thyroid: Thyroid size normal.  No nodules appreciated. No thyroid bruit.  Lungs: Clear with good BS bilat with no rales, rhonchi, or wheezes  Heart: Auscultation: RRR.  Abdomen: Normoactive bowel sounds, soft, nontender, without masses or organomegaly palpable  Extremities:  BL LE: No pretibial edema normal ROM and strength.  Mental Status: Judgment, insight: Intact Orientation: Oriented to time, place, and person Mood and affect: No depression, anxiety, or agitation     DATA REVIEWED: Results for CHINMAYI, RUMER (MRN [id number]) as of 04/25/2020 12:39  Ref. Range 04/03/2020 14:29 04/24/2020 14:00  TSH Latest Ref Range: 0.40 - 4.50 mIU/L 0.38 (L) 0.38 (L)  Triiodothyronine (T3) Latest Ref Range: 76 - 181 ng/dL  88  T4,Free(Direct) Latest Ref Range: 0.8 - 1.8 ng/dL  1.0   CT scan 05/23/2020   IMPRESSION: 1. No neck mass or adenopathy. 2. Multiple hypodense thyroid nodules, the largest in the left lobe measuring approximately 1.4 cm. Recommend thyroid ultrasound for further  evaluation.   Old records , labs and images have been reviewed.    ASSESSMENT / PLAN / RECOMMENDATIONS:   1. Subclinical hyperthyroidism:   - Pt is concerned that hair loss, fatigue and previous palpitations are related to her thyroid.  She is otherwise clinically euthyroid  - TRAb level were undetectable  - We discussed that if her TSH remains low, will proceed with thyroid uptake and scan , but given that this has normalized   2. Multinodular Goiter:  - This was an incidental finding on CT scan of the neck for evaluation of lymphadenopathy.  - Will proceed with dedicated thyroid ultrasound    F/U in 3 months    Signed electronically by: Mack Guise, MD  [facility] End[facility]  [facility] [address] [place], [place] [address] Phone: [phone] FAX: [phone]      CC: [name], DO [address] [place] [address] Phone: [phone]  Fax: [phone]   Return to Endocrinology clinic as below: Future Appointments  Date Time Provider [place]  11/05/2020 10:20 AM [name], DO [facility]

## 2020-06-05 NOTE — Patient Instructions (Signed)
-   Labs today  - I have ordered the thyroid ultrasound  - Will order thyroid uptake and scan after your thyroid results come back

## 2020-06-13 ENCOUNTER — Other Ambulatory Visit: Payer: Self-pay

## 2020-06-13 ENCOUNTER — Ambulatory Visit (HOSPITAL_BASED_OUTPATIENT_CLINIC_OR_DEPARTMENT_OTHER)
Admission: RE | Admit: 2020-06-13 | Discharge: 2020-06-13 | Disposition: A | Discharge status: Home / Self Care | Payer: BC Managed Care – PPO | Source: Ambulatory Visit | Attending: Internal Medicine | Admitting: Internal Medicine

## 2020-06-13 DIAGNOSIS — E042 Nontoxic multinodular goiter: Secondary | ICD-10-CM | POA: Insufficient documentation

## 2020-06-14 ENCOUNTER — Other Ambulatory Visit: Payer: BC Managed Care – PPO

## 2020-06-14 ENCOUNTER — Ambulatory Visit: Payer: BC Managed Care – PPO | Admitting: Hematology & Oncology

## 2020-06-19 ENCOUNTER — Encounter: Payer: Self-pay | Admitting: Internal Medicine

## 2020-09-05 LAB — HM MAMMOGRAPHY

## 2020-09-26 ENCOUNTER — Ambulatory Visit: Payer: BC Managed Care – PPO | Admitting: Dermatology

## 2020-09-26 ENCOUNTER — Encounter: Payer: Self-pay | Admitting: Dermatology

## 2020-09-26 ENCOUNTER — Other Ambulatory Visit: Payer: Self-pay

## 2020-09-26 DIAGNOSIS — L650 Telogen effluvium: Secondary | ICD-10-CM | POA: Diagnosis not present

## 2020-09-26 DIAGNOSIS — L258 Unspecified contact dermatitis due to other agents: Secondary | ICD-10-CM | POA: Diagnosis not present

## 2020-09-26 MED ORDER — CLOBETASOL PROPIONATE 0.05 % EX FOAM: Freq: Two times a day (BID) | CUTANEOUS | 0 refills | Status: DC

## 2020-09-30 ENCOUNTER — Encounter: Payer: Self-pay | Admitting: Dermatology

## 2020-09-30 NOTE — Progress Notes (Signed)
   Follow-Up Visit   Subjective  Jackie Casey is a 60 y.o. female who presents for the following: Skin Problem (Scalp peeling after rogain treatment and hair dye combo, also hair shedding + over active thyroid but after repeat labs her thyroid levels are normal).  Rash and hair loss scalp Location:  Duration:  Quality: Most of scaly rash is clear but still some hair shedding Associated Signs/Symptoms: Modifying Factors: Topical Rogaine may have been the trigger Severity:  Timing: Context:   Objective  Well appearing patient in no apparent distress; mood and affect are within normal limits. Objective  Mid Frontal Scalp: Slightly positive pull test (average 2 hairs for every 10 fold).  Objective  Mid Parietal Scalp: History would certainly fit a contact dermatitis to topical minoxidil, but since she is not planning on reusing this product it is uncertain how much information would be gained by trying to get the product ingredients in doing formal patch testing.  Today there is no residual dermatitis or peeling.    A focused examination was performed including Head, neck, and fingernails . Relevant physical exam findings are noted in the Assessment and Plan.   Assessment & Plan    Telogen effluvium Mid Frontal Scalp  Patient informed that there are reports of telogen effluvium triggered by use of Rogaine (and perhaps contact dermatitis itself can be a trigger.  The likelihood that this will self-correct in 1 year or less is quite high all.  I have asked her to please contact the office t in 6 months with a status update.  clobetasol (OLUX) 0.05 % topical foam - Mid Frontal Scalp  Contact dermatitis due to other agent, unspecified contact dermatitis type Mid Parietal Scalp  Consider patch testing if rash recurs.      I, [name], MD, have reviewed all documentation for this visit.  The documentation on 09/30/20 for the exam, diagnosis, procedures, and orders are  all accurate and complete.

## 2020-11-05 ENCOUNTER — Other Ambulatory Visit: Payer: Self-pay

## 2020-11-05 ENCOUNTER — Ambulatory Visit (INDEPENDENT_AMBULATORY_CARE_PROVIDER_SITE_OTHER): Payer: BC Managed Care – PPO | Admitting: Family Medicine

## 2020-11-05 ENCOUNTER — Encounter: Payer: Self-pay | Admitting: Family Medicine

## 2020-11-05 ENCOUNTER — Ambulatory Visit (HOSPITAL_BASED_OUTPATIENT_CLINIC_OR_DEPARTMENT_OTHER)
Admission: RE | Admit: 2020-11-05 | Discharge: 2020-11-05 | Disposition: A | Discharge status: Home / Self Care | Payer: BC Managed Care – PPO | Source: Ambulatory Visit | Attending: Family Medicine | Admitting: Family Medicine

## 2020-11-05 VITALS — BP 140/80 | HR 52 | Temp 97.8°F | Resp 18 | Ht 65.0 in | Wt 160.0 lb

## 2020-11-05 DIAGNOSIS — G500 Trigeminal neuralgia: Secondary | ICD-10-CM

## 2020-11-05 DIAGNOSIS — M542 Cervicalgia: Secondary | ICD-10-CM | POA: Insufficient documentation

## 2020-11-05 DIAGNOSIS — R519 Headache, unspecified: Secondary | ICD-10-CM

## 2020-11-05 DIAGNOSIS — Z1211 Encounter for screening for malignant neoplasm of colon: Secondary | ICD-10-CM | POA: Diagnosis not present

## 2020-11-05 DIAGNOSIS — Z0000 Encounter for general adult medical examination without abnormal findings: Secondary | ICD-10-CM

## 2020-11-05 DIAGNOSIS — I10 Essential (primary) hypertension: Secondary | ICD-10-CM

## 2020-11-05 LAB — CBC WITH DIFFERENTIAL/PLATELET
Basophils Absolute: 0 10*3/uL (ref 0.0–0.1)
Basophils Relative: 0.8 % (ref 0.0–3.0)
Eosinophils Absolute: 0 10*3/uL (ref 0.0–0.7)
Eosinophils Relative: 2.2 % (ref 0.0–5.0)
HCT: 36.2 % (ref 36.0–46.0)
Hemoglobin: 11.9 g/dL — ABNORMAL LOW (ref 12.0–15.0)
Lymphocytes Relative: 40.4 % (ref 12.0–46.0)
Lymphs Abs: 0.9 10*3/uL (ref 0.7–4.0)
MCHC: 32.9 g/dL (ref 30.0–36.0)
MCV: 84.8 fl (ref 78.0–100.0)
Monocytes Absolute: 0.2 10*3/uL (ref 0.1–1.0)
Monocytes Relative: 11.4 % (ref 3.0–12.0)
Neutro Abs: 1 10*3/uL — ABNORMAL LOW (ref 1.4–7.7)
Neutrophils Relative %: 45.2 % (ref 43.0–77.0)
Platelets: 104 10*3/uL — ABNORMAL LOW (ref 150.0–400.0)
RBC: 4.27 Mil/uL (ref 3.87–5.11)
RDW: 13.8 % (ref 11.5–15.5)
WBC: 2.1 10*3/uL — ABNORMAL LOW (ref 4.0–10.5)

## 2020-11-05 LAB — COMPREHENSIVE METABOLIC PANEL
ALT: 21 U/L (ref 0–35)
AST: 20 U/L (ref 0–37)
Albumin: 4.3 g/dL (ref 3.5–5.2)
Alkaline Phosphatase: 43 U/L (ref 39–117)
BUN: 14 mg/dL (ref 6–23)
CO2: 31 mEq/L (ref 19–32)
Calcium: 9.5 mg/dL (ref 8.4–10.5)
Chloride: 102 mEq/L (ref 96–112)
Creatinine, Ser: 1.01 mg/dL (ref 0.40–1.20)
GFR: 60.21 mL/min (ref >60.00)
Glucose, Bld: 90 mg/dL (ref 70–99)
Potassium: 4.3 mEq/L (ref 3.5–5.1)
Sodium: 139 mEq/L (ref 135–145)
Total Bilirubin: 0.4 mg/dL (ref 0.2–1.2)
Total Protein: 7.5 g/dL (ref 6.0–8.3)

## 2020-11-05 LAB — LIPID PANEL
Cholesterol: 181 mg/dL (ref 0–200)
HDL: 60.2 mg/dL (ref >39.00)
LDL Cholesterol: 108 mg/dL — ABNORMAL HIGH (ref 0–99)
NonHDL: 120.51
Total CHOL/HDL Ratio: 3
Triglycerides: 65 mg/dL (ref 0.0–149.0)
VLDL: 13 mg/dL (ref 0.0–40.0)

## 2020-11-05 LAB — TSH: TSH: 0.77 u[IU]/mL (ref 0.35–4.50)

## 2020-11-05 MED ORDER — PREGABALIN 100 MG PO CAPS: 100.0000 mg | ORAL_CAPSULE | Freq: Two times a day (BID) | ORAL | 3 refills | Status: DC

## 2020-11-05 NOTE — Assessment & Plan Note (Signed)
Pt requests to see neuro Refill lyrica

## 2020-11-05 NOTE — Assessment & Plan Note (Signed)
ghm utd Check labs  

## 2020-11-05 NOTE — Patient Instructions (Signed)
Preventive Care 84-61 Years Old, Female Preventive care refers to lifestyle choices and visits with your health care provider that can promote health and wellness. This includes:  A yearly physical exam. This is also called an annual wellness visit.  Regular dental and eye exams.  Immunizations.  Screening for certain conditions.  Healthy lifestyle choices, such as: ? Eating a healthy diet. ? Getting regular exercise. ? Not using drugs or products that contain nicotine and tobacco. ? Limiting alcohol use. What can I expect for my preventive care visit? Physical exam Your health care provider will check your:  Height and weight. These may be used to calculate your BMI (body mass index). BMI is a measurement that tells if you are at a healthy weight.  Heart rate and blood pressure.  Body temperature.  Skin for abnormal spots. Counseling Your health care provider may ask you questions about your:  Past medical problems.  Family's medical history.  Alcohol, tobacco, and drug use.  Emotional well-being.  Home life and relationship well-being.  Sexual activity.  Diet, exercise, and sleep habits.  Work and work Statistician.  Access to firearms.  Method of birth control.  Menstrual cycle.  Pregnancy history. What immunizations do I need? Vaccines are usually given at various ages, according to a schedule. Your health care provider will recommend vaccines for you based on your age, medical history, and lifestyle or other factors, such as travel or where you work.   What tests do I need? Blood tests  Lipid and cholesterol levels. These may be checked every 5 years, or more often if you are over 3 years old.  Hepatitis C test.  Hepatitis B test. Screening  Lung cancer screening. You may have this screening every year starting at age 73 if you have a 30-pack-year history of smoking and currently smoke or have quit within the past 15 years.  Colorectal cancer  screening. ? All adults should have this screening starting at age 52 and continuing until age 17. ? Your health care provider may recommend screening at age 49 if you are at increased risk. ? You will have tests every 1-10 years, depending on your results and the type of screening test.  Diabetes screening. ? This is done by checking your blood sugar (glucose) after you have not eaten for a while (fasting). ? You may have this done every 1-3 years.  Mammogram. ? This may be done every 1-2 years. ? Talk with your health care provider about when you should start having regular mammograms. This may depend on whether you have a family history of breast cancer.  BRCA-related cancer screening. This may be done if you have a family history of breast, ovarian, tubal, or peritoneal cancers.  Pelvic exam and Pap test. ? This may be done every 3 years starting at age 10. ? Starting at age 11, this may be done every 5 years if you have a Pap test in combination with an HPV test. Other tests  STD (sexually transmitted disease) testing, if you are at risk.  Bone density scan. This is done to screen for osteoporosis. You may have this scan if you are at high risk for osteoporosis. Talk with your health care provider about your test results, treatment options, and if necessary, the need for more tests. Follow these instructions at home: Eating and drinking  Eat a diet that includes fresh fruits and vegetables, whole grains, lean protein, and low-fat dairy products.  Take vitamin and mineral supplements  as recommended by your health care provider.  Do not drink alcohol if: ? Your health care provider tells you not to drink. ? You are pregnant, may be pregnant, or are planning to become pregnant.  If you drink alcohol: ? Limit how much you have to 0-1 drink a day. ? Be aware of how much alcohol is in your drink. In the [place] one drink equals one 12 oz bottle of beer (355 mL), one 5 oz glass of  wine (148 mL), or one 1 oz glass of hard liquor (44 mL).   Lifestyle  Take daily care of your teeth and gums. Brush your teeth every morning and night with fluoride toothpaste. Floss one time each day.  Stay active. Exercise for at least 30 minutes 5 or more days each week.  Do not use any products that contain nicotine or tobacco, such as cigarettes, e-cigarettes, and chewing tobacco. If you need help quitting, ask your health care provider.  Do not use drugs.  If you are sexually active, practice safe sex. Use a condom or other form of protection to prevent STIs (sexually transmitted infections).  If you do not wish to become pregnant, use a form of birth control. If you plan to become pregnant, see your health care provider for a prepregnancy visit.  If told by your health care provider, take low-dose aspirin daily starting at age 50.  Find healthy ways to cope with stress, such as: ? Meditation, yoga, or listening to music. ? Journaling. ? Talking to a trusted person. ? Spending time with friends and family. Safety  Always wear your seat belt while driving or riding in a vehicle.  Do not drive: ? If you have been drinking alcohol. Do not ride with someone who has been drinking. ? When you are tired or distracted. ? While texting.  Wear a helmet and other protective equipment during sports activities.  If you have firearms in your house, make sure you follow all gun safety procedures. What's next?  Visit your health care provider once a year for an annual wellness visit.  Ask your health care provider how often you should have your eyes and teeth checked.  Stay up to date on all vaccines. This information is not intended to replace advice given to you by your health care provider. Make sure you discuss any questions you have with your health care provider. Document Revised: 05/01/2020 Document Reviewed: 04/08/2018 Elsevier Patient Education  2021 [employer].  

## 2020-11-05 NOTE — Progress Notes (Signed)
Patient ID: Jackie Casey, female    DOB: [birth year]  Age: 60 y.o. MRN: [id number]    Subjective:  Subjective  HPI Jackie Casey presents for a comprehensive physical examination. She complains of dark stools. She attributes the dark stool to eating carbohydrates and her vitamins. She also complains of tension in her head. She attributes the tension to her Dx of trigeminal neuralgia. She also complains of neck tension. 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 She denies any chest pain, SOB, fever, abdominal pain, cough, chills, sore throat, dysuria, urinary incontinence, back pain, HA, or N/VD at this time.   Review of Systems  Constitutional: Negative for chills, fatigue and fever.  HENT: Negative for congestion, ear pain, sinus pain and sore throat.        (+) tension in her head, secondary to her Dx of trigeminal neuralgia  Eyes: Negative for pain.  Respiratory: Negative for cough and shortness of breath.   Cardiovascular: Negative for chest pain, palpitations and leg swelling.  Gastrointestinal: Negative for abdominal pain, constipation, diarrhea, nausea and vomiting.       (+) dark stool  Genitourinary: Negative for dysuria, frequency, hematuria and urgency.  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   Neurological: Negative for headaches.    History Past Medical History:  Diagnosis Date  . Anemia    slight per pt.  . Depression   . GERD (gastroesophageal reflux disease)    in past per pt.  . Hypertension   . Thyroid disease    slightly low.  . Trigeminal neuralgia     She has a past surgical history that includes Tonsillectomy; Induced abortion; and Colonoscopy (2011).   Her family history includes Asthma in her mother; Hypertension in her mother; Stroke in her mother.She reports that she has never smoked. She has never used smokeless tobacco. She reports current alcohol  use. She reports that she does not use drugs.  Current Outpatient Medications on File Prior to Visit  Medication Sig Dispense Refill  . clobetasol (OLUX) 0.05 % topical foam Apply topically 2 (two) times daily. 50 g 0  . Multiple Vitamin (MULTIVITAMIN) tablet Take 1 tablet by mouth daily.     Current Facility-Administered Medications on File Prior to Visit  Medication Dose Route Frequency Provider Last Rate Last Admin  . triamcinolone acetonide (KENALOG-40) injection 40 mg  40 mg Intramuscular Once [name], MD         Objective:  Objective  Physical Exam Vitals and nursing note reviewed.  Constitutional:      General: She is not in acute distress.    Appearance: Normal appearance. She is well-developed. She is not ill-appearing.  HENT:     Head: Normocephalic and atraumatic.     Right Ear: Tympanic membrane, ear canal and external ear normal.     Left Ear: Tympanic membrane, ear canal and external ear normal.     Nose: Nose normal.  Eyes:     Extraocular Movements: Extraocular movements intact.     Pupils: Pupils are equal, round, and reactive to light.  Cardiovascular:     Rate and Rhythm: Normal rate and regular rhythm.     Pulses: Normal pulses.     Heart sounds: Normal heart sounds. No murmur heard. No friction rub. No gallop.   Pulmonary:     Effort: Pulmonary effort is normal. No respiratory distress.  Breath sounds: Normal breath sounds. No wheezing, rhonchi or rales.  Abdominal:     General: Bowel sounds are normal. There is no distension.     Palpations: Abdomen is soft.     Tenderness: There is no abdominal tenderness. There is no guarding.     Hernia: No hernia is present.  Musculoskeletal:        General: Normal range of motion.     Cervical back: Normal range of motion and neck supple.  Skin:    General: Skin is warm and dry.  Neurological:     Mental Status: She is alert and oriented to person, place, and time.  Psychiatric:        Behavior:  Behavior normal.        Thought Content: Thought content normal.    BP 140/80 (BP Location: Right Arm, Patient Position: Sitting, Cuff Size: Normal)   Pulse (!) 52   Temp 97.8 F (36.6 C) (Oral)   Resp 18   Ht 5\' 5"  (1.651 m)   Wt 160 lb (72.6 kg)   LMP 04/01/2016   SpO2 100%   BMI 26.63 kg/m  Wt Readings from Last 3 Encounters:  11/05/20 160 lb (72.6 kg)  06/05/20 154 lb 6 oz (70 kg)  05/24/20 151 lb 9.6 oz (68.8 kg)     Lab Results  Component Value Date   WBC 2.1 Repeated and verified X2. (L) 11/05/2020   HGB 11.9 (L) 11/05/2020   HCT 36.2 11/05/2020   PLT 104.0 (L) 11/05/2020   GLUCOSE 90 11/05/2020   CHOL 181 11/05/2020   TRIG 65.0 11/05/2020   HDL 60.20 11/05/2020   LDLCALC 108 (H) 11/05/2020   ALT 21 11/05/2020   AST 20 11/05/2020   NA 139 11/05/2020   K 4.3 11/05/2020   CL 102 11/05/2020   CREATININE 1.01 11/05/2020   BUN 14 11/05/2020   CO2 31 11/05/2020   TSH 0.77 11/05/2020   INR 1.1 03/15/2008   HGBA1C 5.3 09/05/2016   MICROALBUR <0.2 09/22/2014    US THYROID  Result Date: 06/13/2020 CLINICAL DATA:  60 year old female with multinodular thyroid EXAM: THYROID ULTRASOUND TECHNIQUE: Ultrasound examination of the thyroid gland and adjacent soft tissues was performed. COMPARISON:  None. FINDINGS: Parenchymal Echotexture: Normal Isthmus: 0.6 cm Right lobe: 4.1 cm x 2.5 cm x 1.2 cm Left lobe: 34.9 cm x 1.8 cm x 2.0 cm _________________________________________________________ Estimated total number of nodules >/= 1 cm: 2 Number of spongiform nodules >/=  2 cm not described below (TR1): 0 Number of mixed cystic and solid nodules >/= 1.5 cm not described below (TR2): 0 _________________________________________________________ Nodule # 1: Location: Right; Mid Maximum size: 0.6 cm; Other 2 dimensions: 0.5 cm x 0.5 cm Composition: cannot determine (2) Echogenicity: isoechoic (1) Shape: not taller-than-wide (0) Margins: ill-defined (0) Echogenic foci: none (0) ACR TI-RADS  total points: 3. ACR TI-RADS risk category: TR3 (3 points). ACR TI-RADS recommendations: Nodule does not meet criteria for surveillance or biopsy _________________________________________________________ Nodule # 2: Location: Left; Mid Maximum size: 1.1 cm; Other 2 dimensions: 0.7 cm by 1.1 cm cm Composition: cannot determine (2) Echogenicity: isoechoic (1) Shape: not taller-than-wide (0) Margins: ill-defined (0) Echogenic foci: none (0) ACR TI-RADS total points: 3. ACR TI-RADS risk category: TR3 (3 points). ACR TI-RADS recommendations: Nodule does not meet criteria for surveillance or biopsy _________________________________________________________ No adenopathy IMPRESSION: No thyroid nodule meets criteria for biopsy or surveillance, as designated by the newly established ACR TI-RADS criteria. Recommendations follow those established by  the new ACR TI-RADS criteria (J Am Coll Radiol 6270;35:009-381). Electronically Signed   By: Corrie Mckusick D.O.   On: 06/13/2020 14:14     Assessment & Plan:  Plan    Meds ordered this encounter  Medications  . pregabalin (LYRICA) 100 MG capsule    Sig: Take 1 capsule (100 mg total) by mouth 2 (two) times daily.    Dispense:  180 capsule    Refill:  3    Problem List Items Addressed This Visit      Unprioritized   Preventative health care - Primary    ghm utd Check labs       Relevant Orders   Lipid panel (Completed)   CBC with Differential/Platelet (Completed)   Comprehensive metabolic panel (Completed)   TSH (Completed)   TRIGEMINAL NEURALGIA    Pt requests to see neuro Refill lyrica       Relevant Medications   pregabalin (LYRICA) 100 MG capsule   Other Relevant Orders   Ambulatory referral to Neurology    Other Visit Diagnoses    Colon cancer screening       Relevant Orders   Fecal occult blood, imunochemical   Intractable headache, unspecified chronicity pattern, unspecified headache type       Relevant Medications   pregabalin  (LYRICA) 100 MG capsule   Other Relevant Orders   Ambulatory referral to Neurology   Neck pain       Relevant Orders   Ambulatory referral to Chiropractic   DG Cervical Spine Complete     Mammogram- Last completed on 09/05/2020, normal finding, repeat every 1 year.  Pap smear- Last completed on 11/29/2009, normal finding, repeat every 3 years.   Colonoscopy- Last completed on 04/27/2020, polyp noted, repeat every 5 years.  Follow-up: Return in about 1 year (around 11/05/2021), or if symptoms worsen or fail to improve, for fasting, annual exam.   I,Gordon Zheng,acting as a scribe for [name], DO.,have documented all relevant documentation on the behalf of [name], DO,as directed by  [name], DO while in the presence of Allen, DO, have reviewed all documentation for this visit. The documentation on 11/05/20 for the exam, diagnosis, procedures, and orders are all accurate and complete.

## 2020-12-26 NOTE — Progress Notes (Signed)
Cardiology Office Note:    Date:  12/27/2020   ID:  Jackie Casey, DOB Aug 22, 1960, MRN [id number]  PCP:  Carollee Herter, Alferd Apa, DO  Cardiologist:  Buford Dresser, MD  Referring MD: Carollee Herter, Alferd Apa, *   CC: follow up  History of Present Illness:    Jackie Casey is a 60 y.o. female with a hx of trigeminal neuralgia, chest pressure who is seen for follow up today. I initially met her 09/02/19 as a new consult at the request of [name], * for the evaluation and management of chest pressure.  Cardiac history: had echo, stress test, CT coronary for chest pressure. On my review of her echo, the grade 3 diastolic dysfunction is likely inaccurate. Her medial and lateral e' are 10.8 and 11.3, and her e/e' is less than 15. Her E/A ratio is 2.21, TR vmax 2.8 m/s, LA 32 ml/m2. At most, this is indeterminate diastolic dysfunction.  Exercise treadmill stress test showed excellent exercise capacity, able to do 12.5 METs without symptoms. However, she did have ST depressions. We discussed that this can be ischemia, but it can also be a false positive. Discussed options for further evaluation, decided to pursue CT cardiac for anatomy evaluation. CCTA showed calcium of zero, no CAD.  Today: She is feeling good overall. A few weeks ago she had some chest discomfort that may be related to acid reflux. It was a heavy pain that she describes as "feeling like her heart was going to fall out." She notes her diet has a lot of spicy foods. We reviewed her cardiac testing to date and that this is low risk overall for a cardiac etiology.  Her thyroid began to regulate again, she did not need to start any medications.   To stay active she walks at least 2-3 times a week and also participates in [place].  She denies any shortness of breath, palpitations, or exertional symptoms. No headaches, lightheadedness, or syncope to report. Also has no lower extremity edema, orthopnea or  PND.   Past Medical History:  Diagnosis Date  . Anemia    slight per pt.  . Depression   . GERD (gastroesophageal reflux disease)    in past per pt.  . Hypertension   . Thyroid disease    slightly low.  . Trigeminal neuralgia     Past Surgical History:  Procedure Laterality Date  . COLONOSCOPY  2011  . INDUCED ABORTION    . TONSILLECTOMY      Current Medications: Current Outpatient Medications on File Prior to Visit  Medication Sig  . clobetasol (OLUX) 0.05 % topical foam Apply topically 2 (two) times daily.  . Multiple Vitamin (MULTIVITAMIN) tablet Take 1 tablet by mouth daily.  . pregabalin (LYRICA) 100 MG capsule Take 1 capsule (100 mg total) by mouth 2 (two) times daily.   Current Facility-Administered Medications on File Prior to Visit  Medication  . triamcinolone acetonide (KENALOG-40) injection 40 mg     Allergies:   Patient has no known allergies.   Social History   Tobacco Use  . Smoking status: Never Smoker  . Smokeless tobacco: Never Used  . Tobacco comment: NEVER USED TOBACCO  Vaping Use  . Vaping Use: Never used  Substance Use Topics  . Alcohol use: Yes    Alcohol/week: 0.0 standard drinks    Comment: rare--  1-2 x a month  . Drug use: No    Family History: family history includes Asthma in her  mother; Hypertension in her mother; Stroke in her mother. There is no history of Colon cancer, Colon polyps, Esophageal cancer, Rectal cancer, or Stomach cancer.  ROS:   Please see the history of present illness.   (+) Heavy chest pain Additional pertinent ROS otherwise unremarkable.  EKGs/Labs/Other Studies Reviewed:    The following studies were reviewed today:  CT Coronary Morph 02/27/2020: 1. No evidence of CAD, CADRADS = 0.  2. Coronary calcium score of 0. This was 0 percentile for age and sex matched control.  3. Normal coronary origin with right dominance.  ETT 12/01/19  Blood pressure demonstrated a normal response to  exercise.  ST segment depression was noted during stress.   ETT with good exercise tolerance (10:30); no chest pain; normal blood pressure response; 2 to 3 mm of ST depression in the inferior leads suggestive of ischemia; abnormal exercise treadmill.  Echo 11/16/19 (see my interpretation of diastolic function above): 1. Left ventricular ejection fraction, by estimation, is 60 to 65%. The  left ventricle has normal function. The left ventricle has no regional  wall motion abnormalities. Left ventricular diastolic parameters are  consistent with Grade III diastolic  dysfunction (restrictive).  2. Left atrial size was moderately dilated.  3. The mitral valve is normal in structure. Mild mitral valve  regurgitation. No evidence of mitral stenosis.   EKG:  EKG is personally reviewed.   12/27/2020: sinus bradycardia at 49 bpm 09/02/19: sinus bradycardia  Recent Labs: 11/05/2020: ALT 21; BUN 14; Creatinine, Ser 1.01; Hemoglobin 11.9; Platelets 104.0; Potassium 4.3; Sodium 139; TSH 0.77  Recent Lipid Panel    Component Value Date/Time   CHOL 181 11/05/2020 1052   TRIG 65.0 11/05/2020 1052   HDL 60.20 11/05/2020 1052   CHOLHDL 3 11/05/2020 1052   VLDL 13.0 11/05/2020 1052   LDLCALC 108 (H) 11/05/2020 1052    Physical Exam:    VS:  BP 124/60 (BP Location: Left Arm, Patient Position: Sitting, Cuff Size: Normal)   Pulse (!) 49   Ht $R'5\' 5"'pB$  (1.651 m)   Wt 159 lb (72.1 kg)   LMP 04/01/2016   BMI 26.46 kg/m     Wt Readings from Last 3 Encounters:  12/27/20 159 lb (72.1 kg)  11/05/20 160 lb (72.6 kg)  06/05/20 154 lb 6 oz (70 kg)    GEN: Well nourished, well developed in no acute distress HEENT: Normal, moist mucous membranes NECK: No JVD CARDIAC: regular rhythm, normal S1 and S2, no rubs or gallops. No murmur. VASCULAR: Radial and DP pulses 2+ bilaterally. No carotid bruits RESPIRATORY:  Clear to auscultation without rales, wheezing or rhonchi  ABDOMEN: Soft, non-tender,  non-distended MUSCULOSKELETAL:  Ambulates independently SKIN: Warm and dry, no edema NEUROLOGIC:  Alert and oriented x 3. No focal neuro deficits noted. PSYCHIATRIC:  Normal affect   ASSESSMENT:    1. Chest pain, unspecified type   2. Encounter to discuss test results   3. Counseling on health promotion and disease prevention   4. Cardiac risk counseling    PLAN:    Chest pressure: has had thorough eval with echo, stress test, and CCTA, as above.  -unlikely to be cardiac in etiology, reviewed results of testing and discussed possible alternative causes -counseled on red flag warning signs that need medical attention, even with low risk testing  Cardiac risk counseling and prevention recommendations: -recommend heart healthy/Mediterranean diet, with whole grains, fruits, vegetable, fish, lean meats, nuts, and olive oil. Limit salt. -recommend moderate walking, 3-5 times/week for  30-50 minutes each session. Aim for at least 150 minutes.week. Goal should be pace of 3 miles/hours, or walking 1.5 miles in 30 minutes -recommend avoidance of tobacco products. Avoid excess alcohol. -ASCVD risk score: The 10-year ASCVD risk score Mikey Bussing DC Brooke Bonito., et al., 2013) is: 4.5%   Values used to calculate the score:     Age: 34 years     Sex: Female     Is Non-Hispanic African American: Yes     Diabetic: No     Tobacco smoker: No     Systolic Blood Pressure: 211 mmHg     Is BP treated: No     HDL Cholesterol: 60.2 mg/dL     Total Cholesterol: 181 mg/dL    Plan for follow up: 2 years or sooner as needed (per patient preference, PRN acceptable)  Medication Adjustments/Labs and Tests Ordered: Current medicines are reviewed at length with the patient today.  Concerns regarding medicines are outlined above.  Orders Placed This Encounter  Procedures  . EKG 12-Lead   No orders of the defined types were placed in this encounter.   Patient Instructions  Medication Instructions:  Your Physician  recommend you continue on your current medication as directed.    *If you need a refill on your cardiac medications before your next appointment, please call your pharmacy*   Lab Work: None   Testing/Procedures: None   Follow-Up: At [facility] you and your health needs are our priority.  As part of our continuing mission to provide you with exceptional heart care, we have created designated Provider Care Teams.  These Care Teams include your primary Cardiologist (physician) and Advanced Practice Providers (APPs -  Physician Assistants and Nurse Practitioners) who all work together to provide you with the care you need, when you need it.  We recommend signing up for the patient portal called "MyChart".  Sign up information is provided on this After Visit Summary.  MyChart is used to connect with patients for Virtual Visits (Telemedicine).  Patients are able to view lab/test results, encounter notes, upcoming appointments, etc.  Non-urgent messages can be sent to your provider as well.   To learn more about what you can do with MyChart, go to [email or web address].    Your next appointment:   2 year(s) @ [address] [place], [place] [address]   The format for your next appointment:   In Person  Provider:   Buford Dresser, MD        [facility],acting as a scribe for Buford Dresser, MD.,have documented all relevant documentation on the behalf of Buford Dresser, MD,as directed by  Buford Dresser, MD while in the presence of Buford Dresser, MD.  I, Buford Dresser, MD, have reviewed all documentation for this visit. The documentation on 12/27/20 for the exam, diagnosis, procedures, and orders are all accurate and complete.  Signed, Buford Dresser, MD PhD 12/27/2020  [place]

## 2020-12-27 ENCOUNTER — Ambulatory Visit (INDEPENDENT_AMBULATORY_CARE_PROVIDER_SITE_OTHER): Payer: BC Managed Care – PPO | Admitting: Cardiology

## 2020-12-27 ENCOUNTER — Encounter: Payer: Self-pay | Admitting: Cardiology

## 2020-12-27 ENCOUNTER — Other Ambulatory Visit: Payer: Self-pay

## 2020-12-27 VITALS — BP 124/60 | HR 49 | Ht 65.0 in | Wt 159.0 lb

## 2020-12-27 DIAGNOSIS — Z712 Person consulting for explanation of examination or test findings: Secondary | ICD-10-CM | POA: Diagnosis not present

## 2020-12-27 DIAGNOSIS — R079 Chest pain, unspecified: Secondary | ICD-10-CM | POA: Diagnosis not present

## 2020-12-27 DIAGNOSIS — Z7189 Other specified counseling: Secondary | ICD-10-CM | POA: Diagnosis not present

## 2020-12-27 NOTE — Patient Instructions (Signed)
Medication Instructions:  Your Physician recommend you continue on your current medication as directed.    *If you need a refill on your cardiac medications before your next appointment, please call your pharmacy*   Lab Work: None   Testing/Procedures: None   Follow-Up: At [facility], you and your health needs are our priority.  As part of our continuing mission to provide you with exceptional heart care, we have created designated Provider Care Teams.  These Care Teams include your primary Cardiologist (physician) and Advanced Practice Providers (APPs -  Physician Assistants and Nurse Practitioners) who all work together to provide you with the care you need, when you need it.  We recommend signing up for the patient portal called "MyChart".  Sign up information is provided on this After Visit Summary.  MyChart is used to connect with patients for Virtual Visits (Telemedicine).  Patients are able to view lab/test results, encounter notes, upcoming appointments, etc.  Non-urgent messages can be sent to your provider as well.   To learn more about what you can do with MyChart, go to [email or web address].    Your next appointment:   2 year(s) @ [address] [place], [place] [address]   The format for your next appointment:   In Person  Provider:   Bridgette Christopher, MD     

## 2021-01-18 ENCOUNTER — Ambulatory Visit: Payer: BC Managed Care – PPO | Admitting: Neurology

## 2021-01-24 ENCOUNTER — Encounter: Payer: Self-pay | Admitting: Neurology

## 2021-01-24 ENCOUNTER — Ambulatory Visit: Payer: BC Managed Care – PPO | Admitting: Neurology

## 2021-01-24 ENCOUNTER — Other Ambulatory Visit: Payer: Self-pay

## 2021-01-24 VITALS — BP 106/60 | HR 58 | Ht 65.0 in | Wt 158.5 lb

## 2021-01-24 DIAGNOSIS — R519 Headache, unspecified: Secondary | ICD-10-CM | POA: Diagnosis not present

## 2021-01-24 DIAGNOSIS — G500 Trigeminal neuralgia: Secondary | ICD-10-CM

## 2021-01-24 MED ORDER — PREGABALIN 75 MG PO CAPS: 75.0000 mg | ORAL_CAPSULE | Freq: Three times a day (TID) | ORAL | 3 refills | Status: DC

## 2021-01-24 NOTE — Progress Notes (Signed)
Chief Complaint  Patient presents with   New Patient (Initial Visit)    Referred for right-sided trigeminal neuralgia and headaches. She is taking pregabalin 100mg , one cap BID. It does not take the pain away completely but keeps it controlled. States headaches are not very often and she sometimes goes months without any issues. She describes headaches as "tension" or "movements across forehead".  She will infrequently use Tylenol. They tend to resolve with rest.       [place]  Jackie Casey is a 60 y.o. female   Chronic trigeminal neuralgia  Overall is under excellent control with low-dose of Lyrica, will change from 100 mg twice daily to 75 mg 3 times daily to have better coverage throughout the day  Headache has some migraine features  She will continue to use NSAIDs, mildly needed, if she has worsening symptoms contact our office  Pancytopenia  DIAGNOSTIC DATA (LABS, IMAGING, TESTING) - I reviewed patient records, labs, notes, testing and imaging myself where available.  Laboratory evaluation in March 2022: Normal TSH, CMP, CBC showed WBC of 2.1, hemoglobin of 11.1, platelet of 104, which is over about 4 baseline  Lipid panel, LDL MILDLY elevated 108, total cholesterol 181  CT head without contrast April 06, 2020 showed no significant   abnormality  CT neck of soft tissue October 2021, multiple hypodense thyroid nodules, largest in the left lobe measuring 1.4 cm   HISTORICAL  Jackie Casey is a 60 year old female, seen in request by her primary care physician Dr. Carollee Herter, Kendrick Fries for evaluation right trigeminal neuralgia, initial evaluation was on January 24, 2021  I reviewed and summarized the referring note.  Past medical history Chronic leukopenia, thrombocytopenia under close monitoring of Dr. Alvy Bimler, most recent visit was in Oct 2021, never received any treatment Lyrica 100mg  bid for trigeminal pain.  Reported history of intermittent right  trigeminal facial pain since 2007, radiating pain from right temporal region right upper teeth, initially she was seen by dentist, tooth extraction did not help her symptoms, was finally diagnosed with trigeminal neuralgia, has been treated with Lyrica 100 mg twice a day, which works well for her most of the time, denies significant side effect, reported have a short trial of Lyrica 100 mg 3 times a day in the past, works even better, with higher dose she barely has any symptoms, now 100 twice a day, she also feels intermittent right facial pain, short lasting, mild  She also complains of intermittent headaches, also across forehead, Tylenol and over-the-counter NSAIDs was helpful  Personally reviewed CT head without contrast August 2021 there was no acute abnormality.  PHYSICAL EXAM:   Vitals:   01/24/21 1001  BP: 106/60  Pulse: (!) 58  Weight: 158 lb 8 oz (71.9 kg)  Height: 5\' 5"  (1.651 m)   Not recorded     Body mass index is 26.38 kg/m.  PHYSICAL EXAMNIATION:  Gen: NAD, conversant, well nourised, well groomed                     Cardiovascular: Regular rate rhythm, no peripheral edema, warm, nontender. Eyes: Conjunctivae clear without exudates or hemorrhage Neck: Supple, no carotid bruits. Pulmonary: Clear to auscultation bilaterally   NEUROLOGICAL EXAM:  MENTAL STATUS: Speech:    Speech is normal; fluent and spontaneous with normal comprehension.  Cognition:     Orientation to time, place and person     Normal recent and remote memory     Normal Attention  span and concentration     Normal Language, naming, repeating,spontaneous speech     Fund of knowledge   CRANIAL NERVES: CN II: Visual fields are full to confrontation. Pupils are round equal and briskly reactive to light. CN III, IV, VI: extraocular movement are normal. No ptosis. CN V: Facial sensation is intact to light touch CN VII: Face is symmetric with normal eye closure  CN VIII: Hearing is normal to  causal conversation. CN IX, X: Phonation is normal. CN XI: Head turning and shoulder shrug are intact  MOTOR: There is no pronator drift of out-stretched arms. Muscle bulk and tone are normal. Muscle strength is normal.  REFLEXES: Reflexes are 2+ and symmetric at the biceps, triceps, knees, and ankles. Plantar responses are flexor.  SENSORY: Intact to light touch, pinprick and vibratory sensation are intact in fingers and toes.  COORDINATION: There is no trunk or limb dysmetria noted.  GAIT/STANCE: Posture is normal. Gait is steady with normal steps, base, arm swing, and turning. Heel and toe walking are normal. Tandem gait is normal.  Romberg is absent.  REVIEW OF SYSTEMS:  Full 14 system review of systems performed and notable only for as above All other review of systems were negative.   ALLERGIES: No Known Allergies  HOME MEDICATIONS: Current Outpatient Medications  Medication Sig Dispense Refill   Multiple Vitamin (MULTIVITAMIN) tablet Take 1 tablet by mouth daily.     pregabalin (LYRICA) 100 MG capsule Take 1 capsule (100 mg total) by mouth 2 (two) times daily. 180 capsule 3   No current facility-administered medications for this visit.    PAST MEDICAL HISTORY: Past Medical History:  Diagnosis Date   Anemia    slight per pt.   Depression    GERD (gastroesophageal reflux disease)    in past per pt.   Headache    Hypertension    Thyroid disease    slightly low.   Trigeminal neuralgia     PAST SURGICAL HISTORY: Past Surgical History:  Procedure Laterality Date   COLONOSCOPY  2011   INDUCED ABORTION     TONSILLECTOMY      FAMILY HISTORY: Family History  Problem Relation Age of Onset   Stroke Mother        stroke   Asthma Mother    Hypertension Mother    Other Father        unsure of medical history   Colon cancer Neg Hx    Colon polyps Neg Hx    Esophageal cancer Neg Hx    Rectal cancer Neg Hx    Stomach cancer Neg Hx     SOCIAL  HISTORY: Social History   Socioeconomic History   Marital status: Married    Spouse name: Not on file   Number of children: 3   Years of education: college   Highest education level: Not on file  Occupational History   Occupation: bus Education administrator: [employer]  Tobacco Use   Smoking status: Never   Smokeless tobacco: Never   Tobacco comments:    NEVER USED TOBACCO  Vaping Use   Vaping Use: Never used  Substance and Sexual Activity   Alcohol use: Yes    Alcohol/week: 0.0 standard drinks    Comment: rare--  1-2 x a month   Drug use: Never   Sexual activity: Yes    Partners: Male    Birth control/protection: None  Other Topics Concern   Not on file  Social History  Narrative   Lives with family   Right-handed.   No daily caffeine use.   Exercise-- crunches, jumping jacks etc, twice a week   Social Determinants of Health   Financial Resource Strain: Not on file  Food Insecurity: Not on file  Transportation Needs: Not on file  Physical Activity: Not on file  Stress: Not on file  Social Connections: Not on file  Intimate Partner Violence: Not on file      Marcial Pacas, M.D. Ph.D.  [facility] [address], [place], [place] [address] Ph: [phone] Fax: [phone]  CC:  [name], [place] [address] [place],  [place] [address]  [name], DO

## 2021-04-12 ENCOUNTER — Telehealth: Payer: Self-pay | Admitting: Family Medicine

## 2021-04-12 NOTE — Telephone Encounter (Signed)
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 She states her job is demanding a lot from her and its affecting her mental and physical health. Please advice.

## 2021-04-12 NOTE — Telephone Encounter (Signed)
Recommend patient having a office visit please

## 2021-04-18 ENCOUNTER — Other Ambulatory Visit: Payer: Self-pay

## 2021-04-18 ENCOUNTER — Encounter: Payer: Self-pay | Admitting: Family Medicine

## 2021-04-18 ENCOUNTER — Ambulatory Visit: Payer: BC Managed Care – PPO | Admitting: Family Medicine

## 2021-04-18 VITALS — BP 134/70 | HR 48 | Temp 98.0°F | Resp 18 | Ht 65.0 in | Wt 158.0 lb

## 2021-04-18 DIAGNOSIS — Z566 Other physical and mental strain related to work: Secondary | ICD-10-CM

## 2021-04-18 DIAGNOSIS — E0590 Thyrotoxicosis, unspecified without thyrotoxic crisis or storm: Secondary | ICD-10-CM

## 2021-04-18 NOTE — Assessment & Plan Note (Signed)
Note given to work only 40 hours

## 2021-04-18 NOTE — Progress Notes (Signed)
Subjective:   By signing my name below, I, Jackie Casey, attest that this documentation has been prepared under the direction and in the presence of Dr. Roma Schanz, DO. 04/18/2021    Patient ID: Jackie Casey, female    DOB: [birth year], 60 y.o.   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 Pt states when stress is high she feels dizziness, fatigue, and weakness    HPI Patient is in today for a office visit.  She complains of SOB, feeling weak, and increased stress after working longer hours at work. Her symptoms worsen while she is under increased stress. She also has thyroid disease which contributes to her symptoms. She works more than 40 hours per week and would like to work only 40 hours per week. Her work has reduced her hours to accommodate her condition but she is still requesting a form to work less hours in case they increase it in the future.   She also complains of fatigue. She continues taking supplements to manage her symptoms and found relief while she first started taking it but now only has mild relief. She continues seeing an endocrinologist specialist to manage her thyroid disease. She has an upcomming appointment with them later this year.   Her blood pressure is doing well during this visit.  BP Readings from Last 3 Encounters:  04/18/21 134/70  01/24/21 106/60  12/27/20 124/60   Pulse Readings from Last 3 Encounters:  04/18/21 (!) 48  01/24/21 (!) 58  12/27/20 (!) 49    Past Medical History:  Diagnosis Date   Anemia    slight per pt.   Depression    GERD (gastroesophageal reflux disease)    in past per pt.   Headache    Hypertension    Thyroid disease    slightly low.   Trigeminal neuralgia     Past Surgical History:  Procedure Laterality Date   COLONOSCOPY  2011   INDUCED ABORTION     TONSILLECTOMY      Family History  Problem Relation Age of  Onset   Stroke Mother        stroke   Asthma Mother    Hypertension Mother    Other Father        unsure of medical history   Colon cancer Neg Hx    Colon polyps Neg Hx    Esophageal cancer Neg Hx    Rectal cancer Neg Hx    Stomach cancer Neg Hx     Social History   Socioeconomic History   Marital status: Married    Spouse name: Not on file   Number of children: 3   Years of education: college   Highest education level: Not on file  Occupational History   Occupation: bus Education administrator: [employer]  Tobacco Use   Smoking status: Never   Smokeless tobacco: Never   Tobacco comments:    NEVER USED TOBACCO  Vaping Use   Vaping Use: Never used  Substance and Sexual Activity   Alcohol use: Yes    Alcohol/week: 0.0 standard drinks    Comment: rare--  1-2 x a month   Drug use: Never   Sexual activity: Yes    Partners: Male    Birth control/protection: None  Other Topics Concern   Not on file  Social History Narrative   Lives with  family   Right-handed.   No daily caffeine use.   Exercise-- crunches, jumping jacks etc, twice a week   Social Determinants of Health   Financial Resource Strain: Not on file  Food Insecurity: Not on file  Transportation Needs: Not on file  Physical Activity: Not on file  Stress: Not on file  Social Connections: Not on file  Intimate Partner Violence: Not on file    Outpatient Medications Prior to Visit  Medication Sig Dispense Refill   Multiple Vitamin (MULTIVITAMIN) tablet Take 1 tablet by mouth daily.     pregabalin (LYRICA) 75 MG capsule Take 1 capsule (75 mg total) by mouth 3 (three) times daily. 270 capsule 3   No facility-administered medications prior to visit.    No Known Allergies  Review of Systems  Constitutional:  Positive for malaise/fatigue. Negative for fever.  HENT:  Negative for congestion.   Eyes:  Negative for blurred vision.  Respiratory:  Positive for shortness of breath. Negative for  cough.   Cardiovascular:  Negative for chest pain, palpitations and leg swelling.  Gastrointestinal:  Negative for vomiting.  Musculoskeletal:  Negative for back pain.  Skin:  Negative for rash.  Neurological:  Negative for loss of consciousness and headaches.  Psychiatric/Behavioral:  The patient is nervous/anxious (increased stress).       Objective:    Physical Exam Vitals and nursing note reviewed.  Constitutional:      General: She is not in acute distress.    Appearance: Normal appearance. She is not ill-appearing.  HENT:     Head: Normocephalic and atraumatic.     Right Ear: External ear normal.     Left Ear: External ear normal.  Eyes:     Extraocular Movements: Extraocular movements intact.     Pupils: Pupils are equal, round, and reactive to light.  Skin:    General: Skin is warm and dry.  Neurological:     Mental Status: She is alert and oriented to person, place, and time.  Psychiatric:        Mood and Affect: Mood normal.        Behavior: Behavior normal.        Thought Content: Thought content normal.        Judgment: Judgment normal.    BP 134/70 (BP Location: Right Arm, Patient Position: Sitting, Cuff Size: Normal)   Pulse (!) 48   Temp 98 F (36.7 C) (Oral)   Resp 18   Ht '5\' 5"'$  (1.651 m)   Wt 158 lb (71.7 kg)   LMP 04/01/2016   SpO2 99%   BMI 26.29 kg/m  Wt Readings from Last 3 Encounters:  04/18/21 158 lb (71.7 kg)  01/24/21 158 lb 8 oz (71.9 kg)  12/27/20 159 lb (72.1 kg)    Diabetic Foot Exam - Simple   No data filed    Lab Results  Component Value Date   WBC 2.1 Repeated and verified X2. (L) 11/05/2020   HGB 11.9 (L) 11/05/2020   HCT 36.2 11/05/2020   PLT 104.0 (L) 11/05/2020   GLUCOSE 90 11/05/2020   CHOL 181 11/05/2020   TRIG 65.0 11/05/2020   HDL 60.20 11/05/2020   LDLCALC 108 (H) 11/05/2020   ALT 21 11/05/2020   AST 20 11/05/2020   NA 139 11/05/2020   K 4.3 11/05/2020   CL 102 11/05/2020   CREATININE 1.01 11/05/2020    BUN 14 11/05/2020   CO2 31 11/05/2020   TSH 0.77 11/05/2020   INR  1.1 03/15/2008   HGBA1C 5.3 09/05/2016   MICROALBUR <0.2 09/22/2014    Lab Results  Component Value Date   TSH 0.77 11/05/2020   Lab Results  Component Value Date   WBC 2.1 Repeated and verified X2. (L) 11/05/2020   HGB 11.9 (L) 11/05/2020   HCT 36.2 11/05/2020   MCV 84.8 11/05/2020   PLT 104.0 (L) 11/05/2020   Lab Results  Component Value Date   NA 139 11/05/2020   K 4.3 11/05/2020   CO2 31 11/05/2020   GLUCOSE 90 11/05/2020   BUN 14 11/05/2020   CREATININE 1.01 11/05/2020   BILITOT 0.4 11/05/2020   ALKPHOS 43 11/05/2020   AST 20 11/05/2020   ALT 21 11/05/2020   PROT 7.5 11/05/2020   ALBUMIN 4.3 11/05/2020   CALCIUM 9.5 11/05/2020   ANIONGAP 1 (L) 05/22/2020   GFR 60.21 11/05/2020   Lab Results  Component Value Date   CHOL 181 11/05/2020   Lab Results  Component Value Date   HDL 60.20 11/05/2020   Lab Results  Component Value Date   LDLCALC 108 (H) 11/05/2020   Lab Results  Component Value Date   TRIG 65.0 11/05/2020   Lab Results  Component Value Date   CHOLHDL 3 11/05/2020   Lab Results  Component Value Date   HGBA1C 5.3 09/05/2016       Assessment & Plan:   Problem List Items Addressed This Visit   None Visit Diagnoses     Stress at work    -  Primary        No orders of the defined types were placed in this encounter.   I, Dr. Roma Schanz, DO, personally preformed the services described in this documentation.  All medical record entries made by the scribe were at my direction and in my presence.  I have reviewed the chart and discharge instructions (if applicable) and agree that the record reflects my personal performance and is accurate and complete. 04/18/2021   I,Jackie Casey,acting as a scribe for [name], DO.,have documented all relevant documentation on the behalf of [name], DO,as directed by  [name], DO while  in the presence of [name], DO.   [name], DO

## 2021-04-18 NOTE — Assessment & Plan Note (Signed)
Per endo °

## 2021-04-18 NOTE — Patient Instructions (Signed)
Stress, Adult Stress is a normal reaction to life events. Stress is what you feel when life demands more than you are used to, or more than you think you can handle. Some stress can be useful, such as studying for a test or meeting a deadline at work. Stress that occurs too often or for too long can cause problems. It can affect your emotional health and interfere with relationships and normal daily activities. Too much stress can weaken your body's defense system (immune system) and increase your risk for physical illness. If you already have a medical problem, stress can make it worse. What are the causes? All sorts of life events can cause stress. An event that causes stress for one person may not be stressful for another person. Major life events, whether positive or negative, commonly cause stress. Examples include: Losing a job or starting a new job. Losing a loved one. Moving to a new town or home. Getting married or divorced. Having a baby. Getting injured or sick. Less obvious life events can also cause stress, especially if they occur day after day or in combination with each other. Examples include: Working long hours. Driving in traffic. Caring for children. Being in debt. Being in a difficult relationship. What are the signs or symptoms? Stress can cause emotional symptoms, including: Anxiety. This is feeling worried, afraid, on edge, overwhelmed, or out of control. Anger, including irritation or impatience. Depression. This is feeling sad, down, helpless, or guilty. Trouble focusing, remembering, or making decisions. Stress can cause physical symptoms, including: Aches and pains. These may affect your head, neck, back, stomach, or other areas of your body. Tight muscles or a clenched jaw. Low energy. Trouble sleeping. Stress can cause unhealthy behaviors, including: Eating to feel better (overeating) or skipping meals. Working too much or putting off tasks. Smoking,  drinking alcohol, or using drugs to feel better. How is this diagnosed? Stress is diagnosed through an assessment by your health care provider. He or she may diagnose this condition based on: Your symptoms and any stressful life events. Your medical history. Tests to rule out other causes of your symptoms. Depending on your condition, your health care provider may refer you to a specialist for further evaluation. How is this treated? Stress management techniques are the recommended treatment for stress. Medicine is not typically recommended for the treatment of stress. Techniques to reduce your reaction to stressful life events include: Stress identification. Monitor yourself for symptoms of stress and identify what causes stress for you. These skills may help you to avoid or prepare for stressful events. Time management. Set your priorities, keep a calendar of events, and learn to say no. Taking these actions can help you avoid making too many commitments. Techniques for coping with stress include: Rethinking the problem. Try to think realistically about stressful events rather than ignoring them or overreacting. Try to find the positives in a stressful situation rather than focusing on the negatives. Exercise. Physical exercise can release both physical and emotional tension. The key is to find a form of exercise that you enjoy and do it regularly. Relaxation techniques. These relax the body and mind. The key is to find one or more that you enjoy and use the techniques regularly. Examples include: Meditation, deep breathing, or progressive relaxation techniques. Yoga or tai chi. Biofeedback, mindfulness techniques, or journaling. Listening to music, being out in nature, or participating in other hobbies. Practicing a healthy lifestyle. Eat a balanced diet, drink plenty of water, limit or  avoid caffeine, and get plenty of sleep. Having a strong support network. Spend time with family, friends,  or other people you enjoy being around. Express your feelings and talk things over with someone you trust. Counseling or talk therapy with a mental health professional may be helpful if you are having trouble managing stress on your own. Follow these instructions at home: Lifestyle  Avoid drugs. Do not use any products that contain nicotine or tobacco, such as cigarettes, e-cigarettes, and chewing tobacco. If you need help quitting, ask your health care provider. Limit alcohol intake to no more than 1 drink a day for nonpregnant women and 2 drinks a day for men. One drink equals 12 oz of beer, 5 oz of wine, or 1 oz of hard liquor Do not use alcohol or drugs to relax. Eat a balanced diet that includes fresh fruits and vegetables, whole grains, lean meats, fish, eggs, and beans, and low-fat dairy. Avoid processed foods and foods high in added fat, sugar, and salt. Exercise at least 30 minutes on 5 or more days each week. Get 7-8 hours of sleep each night. General instructions  Practice stress management techniques as discussed with your health care provider. Drink enough fluid to keep your urine clear or pale yellow. Take over-the-counter and prescription medicines only as told by your health care provider. Keep all follow-up visits as told by your health care provider. This is important. Contact a health care provider if: Your symptoms get worse. You have new symptoms. You feel overwhelmed by your problems and can no longer manage them on your own. Get help right away if: You have thoughts of hurting yourself or others. If you ever feel like you may hurt yourself or others, or have thoughts about taking your own life, get help right away. You can go to your nearest emergency department or call: Your local emergency services (911 in the [place] A suicide crisis helpline, such as the [facility] at 3010449969. This is open 24 hours a day. Summary Stress is a  normal reaction to life events. It can cause problems if it happens too often or for too long. Practicing stress management techniques is the best way to treat stress. Counseling or talk therapy with a mental health professional may be helpful if you are having trouble managing stress on your own. This information is not intended to replace advice given to you by your health care provider. Make sure you discuss any questions you have with your health care provider. Document Revised: 10/05/2020 Document Reviewed: 04/13/2020 Elsevier Patient Education  2022 [employer].

## 2021-05-21 ENCOUNTER — Other Ambulatory Visit: Payer: Self-pay | Admitting: Family Medicine

## 2021-05-22 NOTE — Telephone Encounter (Signed)
Requesting: LYRICA  Contract: NONE UDS: NONE Last Visit: 04/18/21 Next Visit: NONE Last Refill: 01/24/21  Please Advise

## 2021-07-23 ENCOUNTER — Encounter: Payer: Self-pay | Admitting: Internal Medicine

## 2021-07-23 ENCOUNTER — Ambulatory Visit: Payer: BC Managed Care – PPO | Admitting: Internal Medicine

## 2021-07-23 VITALS — BP 120/82 | HR 62 | Ht 65.0 in | Wt 160.4 lb

## 2021-07-23 DIAGNOSIS — E042 Nontoxic multinodular goiter: Secondary | ICD-10-CM

## 2021-07-23 LAB — T4, FREE: Free T4: 0.78 ng/dL (ref 0.60–1.60)

## 2021-07-23 LAB — TSH: TSH: 0.46 u[IU]/mL (ref 0.35–5.50)

## 2021-07-23 NOTE — Patient Instructions (Signed)
Will check your thyroid today, if normal please have this check  once a year during your physical with your Primary Care  Thyroid ultrasound showed small cysts/nodule on the thyroid, no serial ultrasound was recommended but if you feel there's a neck enlargement please bring this to our attention

## 2021-07-23 NOTE — Progress Notes (Signed)
Name: Jackie Casey  MRN/ DOB: [id number], 1961-08-01    Age/ Sex: 60 y.o., female     PCP: [name], DO   Reason for Endocrinology Evaluation: Hyperthyroidism     Initial Endocrinology Clinic Visit: 04/24/2020    PATIENT IDENTIFIER: [name] is a 60 y.o., female with a past medical history of trigeminal neuralgia , HTN and depresison . She has followed with [facility] since 04/24/2020 for consultative assistance with management of her Hyperthyroidism.   HISTORICAL SUMMARY: The patient has been noted with low TSH in 11/2017 with resolution until 03/2020 with a TSH of 0.38 uIU/mL . At the time she was having dizziness, palpitations and sob as well as depression and anxiety, as well as weight loss.    TRAb was undetectable 04/2020 Thyroid ultrasound showed small nodules , none meeting criteria for FNA  or surveillance 06/2020   Of note, the pt has chronic leukopenia and thrombocytopenia since 2008. She has been evaluated by hem/onc in 2016 with recommendations to observe at the time . Re-evaluated 05/2020 suggested related to heritage with no contraindication to treat with RAI if needed.   SUBJECTIVE:    Today (07/23/2021):  Ms. [name] is here for a follow up on subclinical hyperthyroidism. She has NOT been to our clinic in 14 months.   Weigh has been stable  Denies local neck symptoms  Has occasional feelings of weakness that she attributes to work stress , she takes vitamins which help   Denies palpitations  Denies diarrhea or loose stools  Denies tremors  Denies local neck swelling         HISTORY:  Past Medical History:  Past Medical History:  Diagnosis Date   Anemia    slight per pt.   Depression    GERD (gastroesophageal reflux disease)    in past per pt.   Headache    Hypertension    Thyroid disease    slightly low.   Trigeminal neuralgia    Past Surgical History:  Past Surgical History:  Procedure Laterality Date    COLONOSCOPY  2011   INDUCED ABORTION     TONSILLECTOMY     Social History:  reports that she has never smoked. She has never used smokeless tobacco. She reports current alcohol use. She reports that she does not use drugs. Family History:  Family History  Problem Relation Age of Onset   Stroke Mother        stroke   Asthma Mother    Hypertension Mother    Other Father        unsure of medical history   Colon cancer Neg Hx    Colon polyps Neg Hx    Esophageal cancer Neg Hx    Rectal cancer Neg Hx    Stomach cancer Neg Hx      HOME MEDICATIONS: Allergies as of 07/23/2021   No Known Allergies      Medication List        Accurate as of July 23, 2021 10:17 AM. If you have any questions, ask your nurse or doctor.          IMMUNE SUPPORT VITAMIN C PO Take by mouth.   multivitamin tablet Take 1 tablet by mouth daily.   pregabalin 75 MG capsule Commonly known as: Lyrica Take 1 capsule (75 mg total) by mouth 3 (three) times daily.   pregabalin 100 MG capsule Commonly known as: LYRICA Take 1 capsule by mouth twice daily  OBJECTIVE:   PHYSICAL EXAM: VS: BP 120/82 (BP Location: Left Arm, Patient Position: Sitting, Cuff Size: Small)   Pulse 62   Ht 5\' 5"  (1.651 m)   Wt 160 lb 6.4 oz (72.8 kg)   LMP 04/01/2016   SpO2 99%   BMI 26.69 kg/m    EXAM: General: Pt appears well and is in NAD  Neck: General: Supple without adenopathy. Thyroid: Thyroid size normal.  No nodules appreciated. No thyroid bruit.  Lungs: Clear with good BS bilat with no rales, rhonchi, or wheezes  Heart: Auscultation: RRR.  Abdomen: Normoactive bowel sounds, soft, nontender, without masses or organomegaly palpable  Extremities:  BL LE: No pretibial edema normal ROM and strength.  Mental Status: Judgment, insight: Intact Orientation: Oriented to time, place, and person Mood and affect: No depression, anxiety, or agitation     DATA REVIEWED:   Latest Reference  Range & Units 07/23/21 10:29  TSH 0.35 - 5.50 uIU/mL 0.46  T4,Free(Direct) 0.60 - 1.60 ng/dL 0.78    Latest Reference Range & Units 04/24/20 14:00  TRAB <=2.00 IU/L <1.00     CT scan 05/23/2020   IMPRESSION: 1. No neck mass or adenopathy. 2. Multiple hypodense thyroid nodules, the largest in the left lobe measuring approximately 1.4 cm. Recommend thyroid ultrasound for further evaluation.  Thyroid ULtrasound 06/13/2020  Estimated total number of nodules >/= 1 cm: 2   Number of spongiform nodules >/=  2 cm not described below (TR1): 0   Number of mixed cystic and solid nodules >/= 1.5 cm not described below ([place]): 0   _________________________________________________________   Nodule # 1:   Location: Right; Mid   Maximum size: 0.6 cm; Other 2 dimensions: 0.5 cm x 0.5 cm   Composition: cannot determine (2)   Echogenicity: isoechoic (1)   Shape: not taller-than-wide (0)   Margins: ill-defined (0)   Echogenic foci: none (0)   ACR TI-RADS total points: 3.   ACR TI-RADS risk category: TR3 (3 points).   ACR TI-RADS recommendations:   Nodule does not meet criteria for surveillance or biopsy   _________________________________________________________   Nodule # 2:   Location: Left; Mid   Maximum size: 1.1 cm; Other 2 dimensions: 0.7 cm by 1.1 cm cm   Composition: cannot determine (2)   Echogenicity: isoechoic (1)   Shape: not taller-than-wide (0)   Margins: ill-defined (0)   Echogenic foci: none (0)   ACR TI-RADS total points: 3.   ACR TI-RADS risk category: TR3 (3 points).   ACR TI-RADS recommendations:   Nodule does not meet criteria for surveillance or biopsy   _________________________________________________________   No adenopathy   IMPRESSION: No thyroid nodule meets criteria for biopsy or surveillance, as designated by the newly established ACR TI-RADS criteria.      ASSESSMENT / PLAN / RECOMMENDATIONS:   Subclinical  hyperthyroidism:  - She had a TSh of 0.38 uIU/mL in 2021 this was followed by normalization of TFT's  - Pt with non-specific complaint of fatigue that resolves with vitamin D  - TFT's today     2. Multinodular Goiter:  - Thyroid ultrasound showed nodules that do NOT meet criteria for biopsy  nor surveillance.  - No local neck symptoms  - I would not recommend serial thyroid imaging unless there are clinical concerns ( such as local neck symptoms )    F/U PRN    Signed electronically by: Mack Guise, MD  [facility]  [address]  [place], [place] [address] Phone: [phone] FAX: [phone]      CC: [name], DO [address] [place] [address] Phone: [phone]  Fax: [phone]   Return to Endocrinology clinic as below: Future Appointments  Date Time Provider [place]  08/26/2021 10:45 AM Suzzanne Cloud, NP [facility] None  12/05/2021 10:00 AM [name], DO [id number]

## 2021-08-15 ENCOUNTER — Telehealth: Payer: Self-pay | Admitting: Neurology

## 2021-08-15 NOTE — Telephone Encounter (Signed)
LVM for patient to call back to rs. Judson Roch will be out this day.

## 2021-08-26 ENCOUNTER — Ambulatory Visit: Payer: BC Managed Care – PPO | Admitting: Neurology

## 2021-09-11 LAB — HM MAMMOGRAPHY

## 2021-09-16 ENCOUNTER — Other Ambulatory Visit: Payer: Self-pay | Admitting: Family Medicine

## 2021-09-16 MED ORDER — PREGABALIN 100 MG PO CAPS: 100.0000 mg | ORAL_CAPSULE | Freq: Two times a day (BID) | ORAL | 0 refills | Status: DC

## 2021-09-16 NOTE — Telephone Encounter (Signed)
Medication: 90 day supply  pregabalin (LYRICA) 100 MG capsule [[id number]]     Has the patient contacted their pharmacy? No. (If no, request that the patient contact the pharmacy for the refill.) (If yes, when and what did the pharmacy advise?)     Preferred Pharmacy (with phone number or street name):  [facility] ([address]), [place]  [address] [place]) [place] [address]  Phone:  [phone]  Fax:  [phone]     Agent: Please be advised that RX refills may take up to 3 business days. We ask that you follow-up with your pharmacy.

## 2021-09-16 NOTE — Telephone Encounter (Signed)
DOD  Requesting: Lyrica Contract: N/A UDS: N/A Last OV:04/18/21 Next OV: N/A Last Refill: 05/22/2021, #180--0 RF Database:   Please advise

## 2021-09-17 ENCOUNTER — Encounter: Payer: Self-pay | Admitting: *Deleted

## 2021-10-14 ENCOUNTER — Other Ambulatory Visit: Payer: Self-pay | Admitting: Family Medicine

## 2021-10-14 NOTE — Telephone Encounter (Signed)
Requesting: Lyrica '100mg'$   ?Contract: None ?UDS: None ?Last Visit: 04/18/2021 ?Next Visit: 12/05/2021 ?Last Refill: 09/16/2021 #180 and 0RF ? ?Please Advise ? ?

## 2021-11-14 IMAGING — CT CT NECK W/ CM
3 of 4 series · 14 of 33 positions shown, 17 images · IV contrast (Omnipaque)
Comparison: None.

CLINICAL DATA: Lymphadenopathy of the neck.

EXAM:
CT NECK WITH CONTRAST
TECHNIQUE: Multidetector CT imaging of the neck was performed using the
standard protocol following the bolus administration of intravenous
contrast.
CONTRAST:  75mL OMNIPAQUE IOHEXOL 300 MG/ML  SOLN

[Series 6: sag neck · sagittal · 0.47mm/px · 5 of 72 slices shown, 6 images]
[im 24/72  bone]
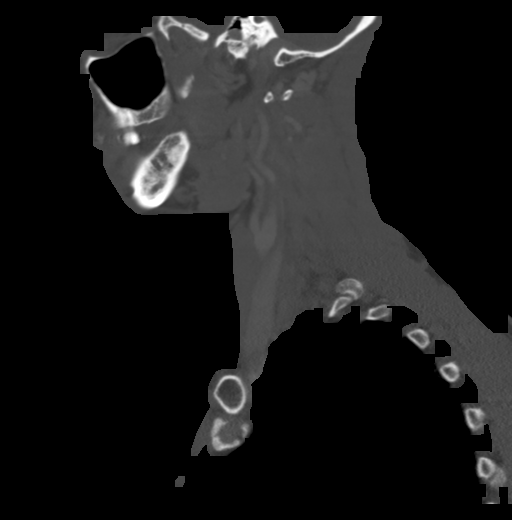
[im 30/72  bone]
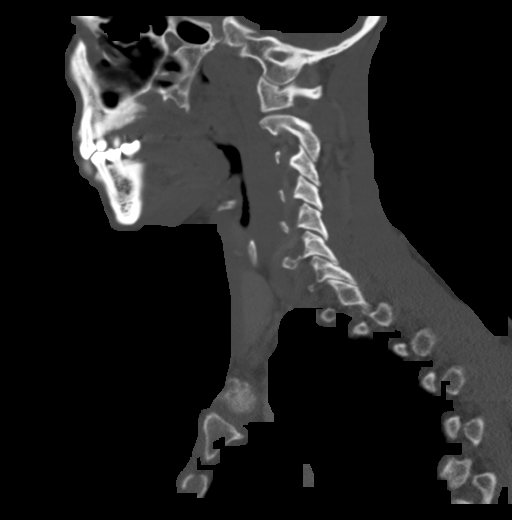
[im 36/72  soft-tissue]
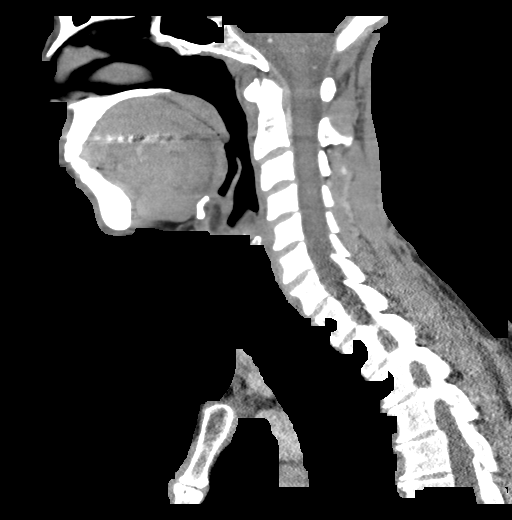
[im 36/72  bone]
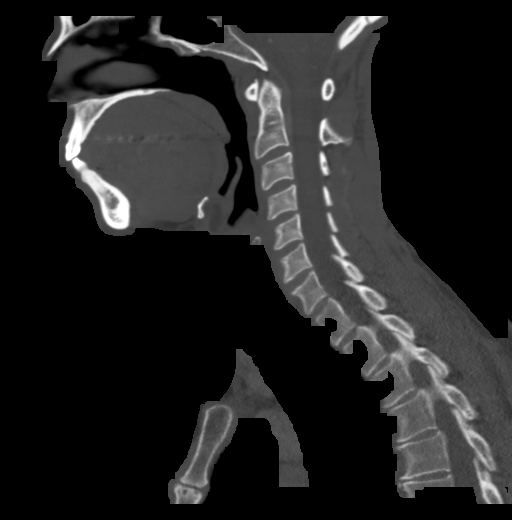
[im 42/72  bone]
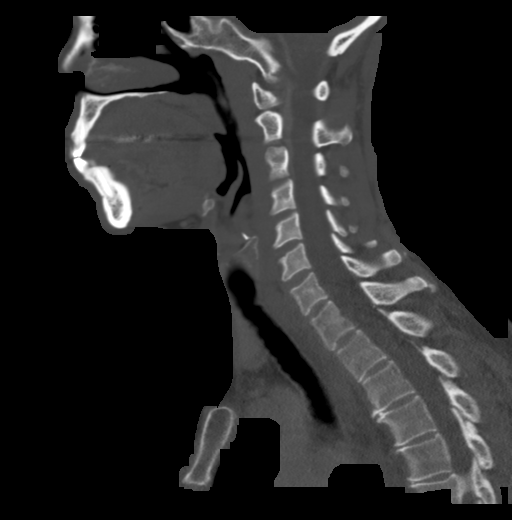
[im 48/72  bone]
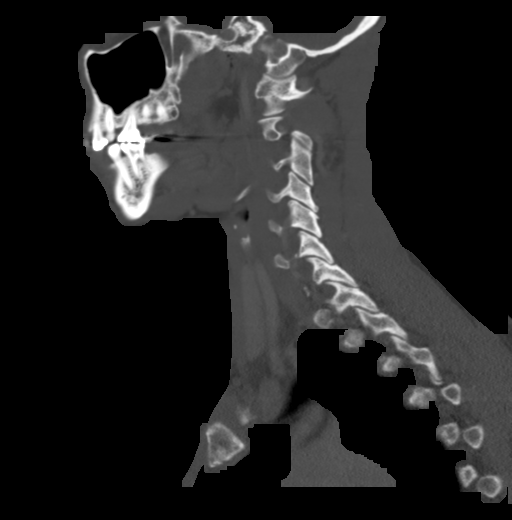

[Series 7: cor neck · coronal · 0.33mm/px · 3 of 77 slices shown]
[im 16/77  bone]
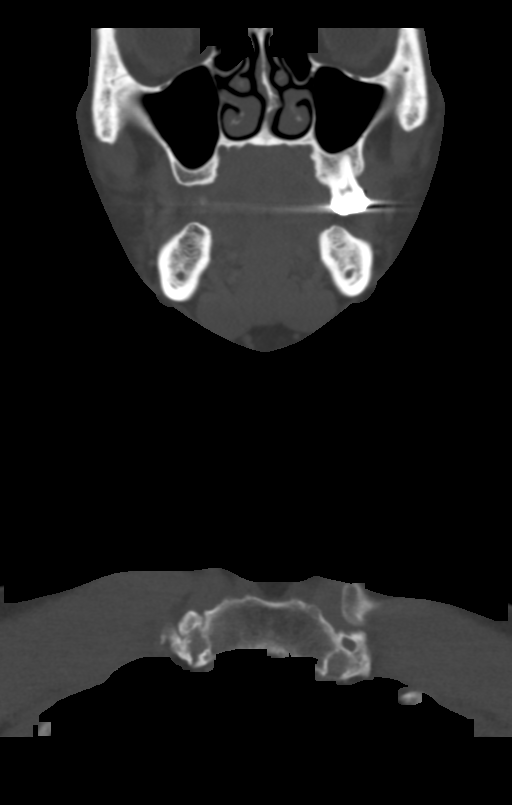
[im 31/77  bone]
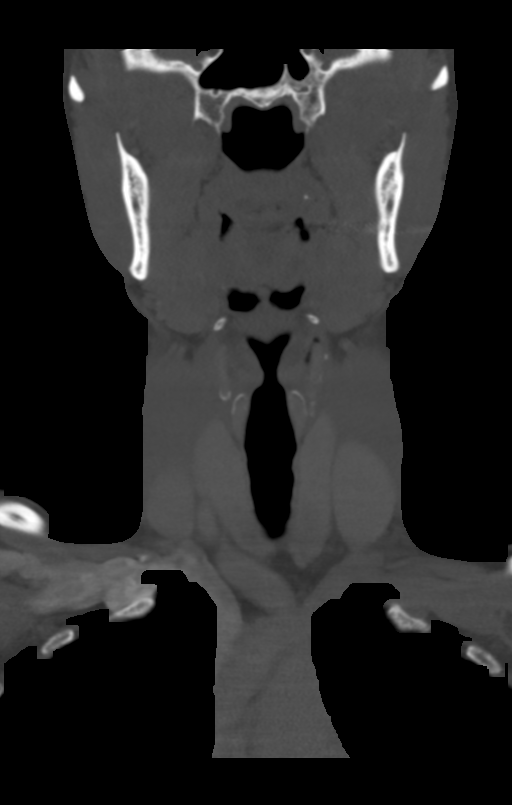
[im 46/77  bone]
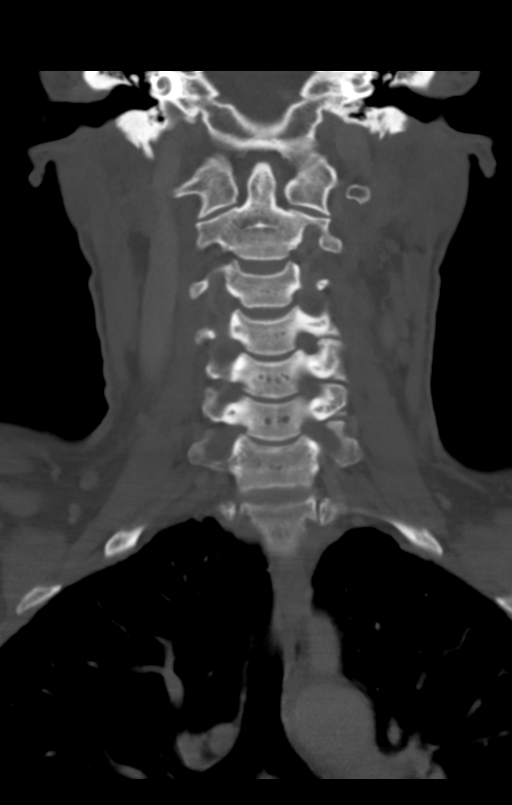

[Series 8: orthogonal ax · axial · 0.32mm/px · z∈[+937,+1120]mm · 6 of 135 slices shown, 8 images]
[im 20/135  soft-tissue]
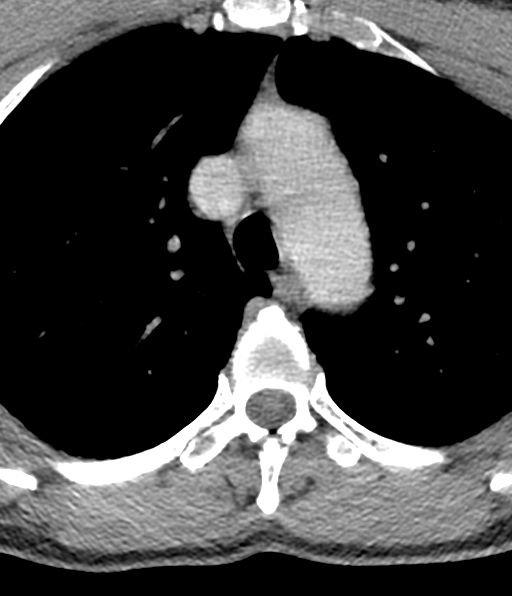
[im 20/135  bone]
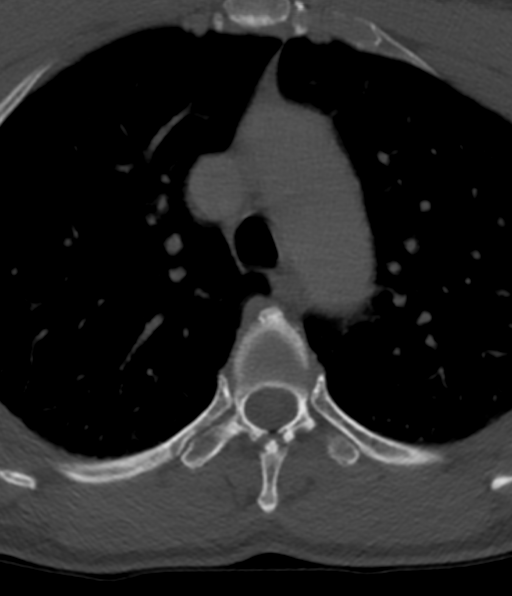
[im 39/135  bone]
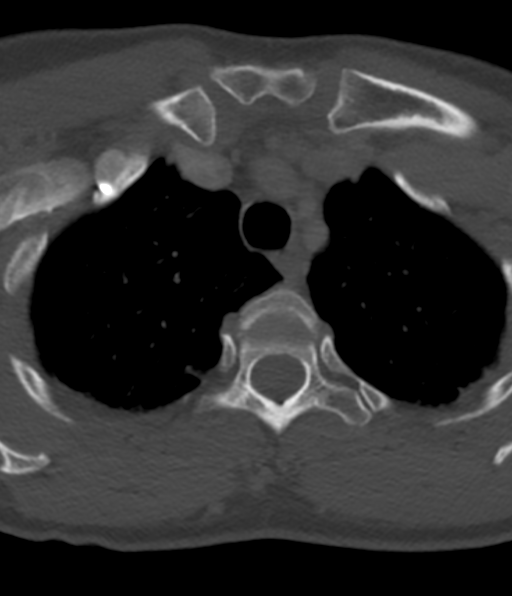
[im 58/135  bone]
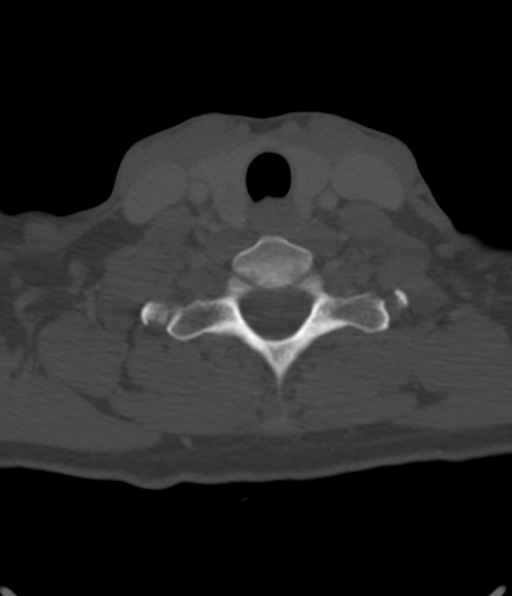
[im 77/135  bone]
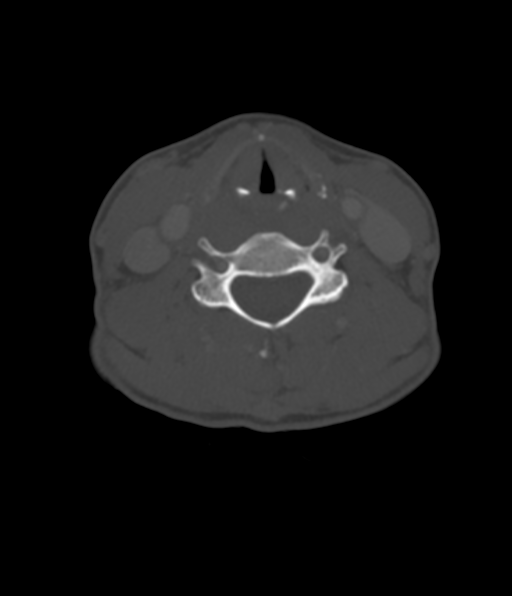
[im 96/135  soft-tissue]
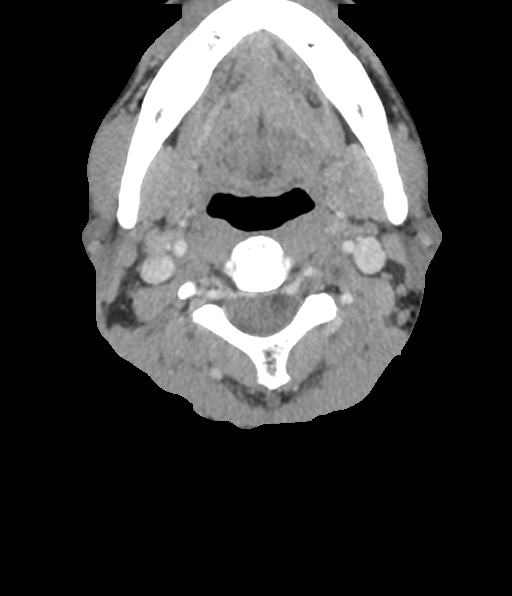
[im 96/135  bone]
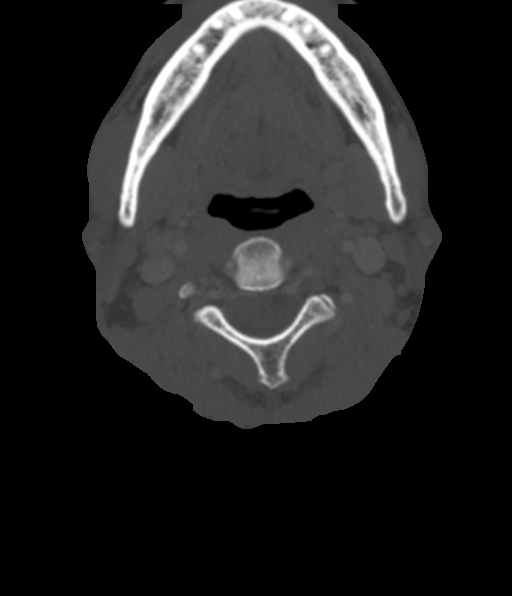
[im 115/135  bone]
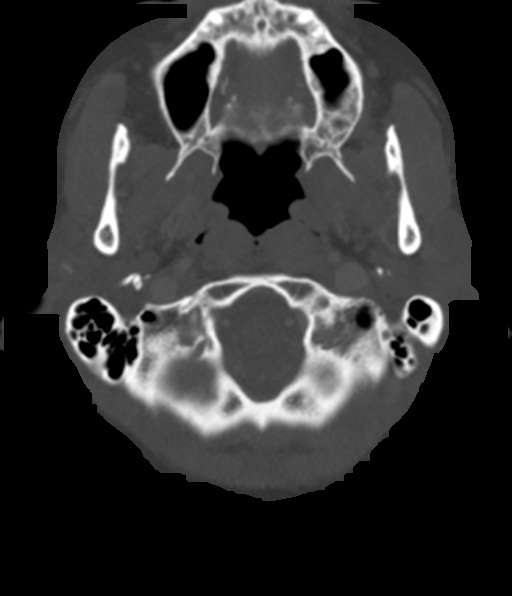

[14 of 33 positions shown; findings below may reference images not displayed]

FINDINGS: Pharynx and larynx: Normal. No mass or swelling.

Salivary glands: No inflammation, mass, or stone.

Thyroid: Multiple hypodense thyroid nodules, the largest in the left
lobe measuring approximately 1.4 cm.

Lymph nodes: None enlarged or abnormal density.

Vascular: Negative.

Limited intracranial: Negative.

Visualized orbits: Negative.

Mastoids and visualized paranasal sinuses: Clear.

Skeleton: No acute or aggressive process.

Upper chest: Negative.

Other: None.
IMPRESSION: 1. No neck mass or adenopathy.
2. Multiple hypodense thyroid nodules, the largest in the left lobe
measuring approximately 1.4 cm. Recommend thyroid ultrasound for
further evaluation.

## 2021-12-05 ENCOUNTER — Encounter: Payer: Self-pay | Admitting: Family Medicine

## 2021-12-05 ENCOUNTER — Ambulatory Visit (INDEPENDENT_AMBULATORY_CARE_PROVIDER_SITE_OTHER): Payer: BC Managed Care – PPO | Admitting: Family Medicine

## 2021-12-05 VITALS — BP 122/82 | HR 51 | Temp 97.6°F | Resp 18 | Ht 65.0 in | Wt 162.8 lb

## 2021-12-05 DIAGNOSIS — Z23 Encounter for immunization: Secondary | ICD-10-CM | POA: Diagnosis not present

## 2021-12-05 DIAGNOSIS — Z0000 Encounter for general adult medical examination without abnormal findings: Secondary | ICD-10-CM | POA: Diagnosis not present

## 2021-12-05 DIAGNOSIS — D696 Thrombocytopenia, unspecified: Secondary | ICD-10-CM

## 2021-12-05 DIAGNOSIS — R5383 Other fatigue: Secondary | ICD-10-CM

## 2021-12-05 DIAGNOSIS — G500 Trigeminal neuralgia: Secondary | ICD-10-CM | POA: Diagnosis not present

## 2021-12-05 LAB — CBC WITH DIFFERENTIAL/PLATELET
Basophils Absolute: 0 10*3/uL (ref 0.0–0.1)
Basophils Relative: 0.9 % (ref 0.0–3.0)
Eosinophils Absolute: 0.1 10*3/uL (ref 0.0–0.7)
Eosinophils Relative: 4.6 % (ref 0.0–5.0)
HCT: 35.7 % — ABNORMAL LOW (ref 36.0–46.0)
Hemoglobin: 11.6 g/dL — ABNORMAL LOW (ref 12.0–15.0)
Lymphocytes Relative: 44.4 % (ref 12.0–46.0)
Lymphs Abs: 1 10*3/uL (ref 0.7–4.0)
MCHC: 32.5 g/dL (ref 30.0–36.0)
MCV: 85.8 fl (ref 78.0–100.0)
Monocytes Absolute: 0.2 10*3/uL (ref 0.1–1.0)
Monocytes Relative: 11.4 % (ref 3.0–12.0)
Neutro Abs: 0.8 10*3/uL — ABNORMAL LOW (ref 1.4–7.7)
Neutrophils Relative %: 38.7 % — ABNORMAL LOW (ref 43.0–77.0)
Platelets: 93 10*3/uL — ABNORMAL LOW (ref 150.0–400.0)
RBC: 4.16 Mil/uL (ref 3.87–5.11)
RDW: 13.3 % (ref 11.5–15.5)
WBC: 2.2 10*3/uL — ABNORMAL LOW (ref 4.0–10.5)

## 2021-12-05 LAB — COMPREHENSIVE METABOLIC PANEL
ALT: 21 U/L (ref 0–35)
AST: 23 U/L (ref 0–37)
Albumin: 4.3 g/dL (ref 3.5–5.2)
Alkaline Phosphatase: 46 U/L (ref 39–117)
BUN: 15 mg/dL (ref 6–23)
CO2: 31 mEq/L (ref 19–32)
Calcium: 9.4 mg/dL (ref 8.4–10.5)
Chloride: 102 mEq/L (ref 96–112)
Creatinine, Ser: 0.98 mg/dL (ref 0.40–1.20)
GFR: 61.96 mL/min (ref >60.00)
Glucose, Bld: 79 mg/dL (ref 70–99)
Potassium: 4.8 mEq/L (ref 3.5–5.1)
Sodium: 137 mEq/L (ref 135–145)
Total Bilirubin: 0.5 mg/dL (ref 0.2–1.2)
Total Protein: 7.4 g/dL (ref 6.0–8.3)

## 2021-12-05 LAB — LIPID PANEL
Cholesterol: 172 mg/dL (ref 0–200)
HDL: 63.5 mg/dL (ref >39.00)
LDL Cholesterol: 99 mg/dL (ref 0–99)
NonHDL: 108.96
Total CHOL/HDL Ratio: 3
Triglycerides: 52 mg/dL (ref 0.0–149.0)
VLDL: 10.4 mg/dL (ref 0.0–40.0)

## 2021-12-05 LAB — VITAMIN B12: Vitamin B-12: 1082 pg/mL — ABNORMAL HIGH (ref 211–911)

## 2021-12-05 LAB — TSH: TSH: 0.45 u[IU]/mL (ref 0.35–5.50)

## 2021-12-05 LAB — VITAMIN D 25 HYDROXY (VIT D DEFICIENCY, FRACTURES): VITD: 50.14 ng/mL (ref 30.00–100.00)

## 2021-12-05 NOTE — Patient Instructions (Signed)

## 2021-12-05 NOTE — Assessment & Plan Note (Signed)
ghm utd Check labs  

## 2021-12-05 NOTE — Assessment & Plan Note (Signed)
F/u neuro  

## 2021-12-05 NOTE — Assessment & Plan Note (Signed)
Recheck wbc  ?

## 2021-12-05 NOTE — Progress Notes (Addendum)
? ?Subjective:  ? ?By signing my name below, I, [name], attest that this documentation has been prepared under the direction and in the presence of [name], DO. 12/05/2021 ?  ? ? Patient ID: Jackie Casey, female    DOB: 1961/04/25, 61 y.o.   MRN: [id number] ? ?Chief Complaint  ?Patient presents with  ? Annual Exam  ?  Pt states having coffee and no food   ? ? ?HPI ?Patient is in today for a comprehensive physical exam.  ? ?She continues taking 75 mg lyrica 3x daily PO and reports doing well while taking it.  ?She continues seeing her dermatologist regularly. She is planning on seeing them again to manage her keloid over her chest.  ?She denies having any fever, new muscle pain, new joint pain, new moles, congestion, sinus pain, sore throat, chest pain, palpations, cough, SOB, wheezing, n/v/d, constipation, blood in stool, dysuria, frequency, hematuria, or headaches at this time. ?She has no change in family medical history.  ?She is due for a tetanus vaccine and is interested in receiving it during this visit. She is interested in receiving the shingrix vaccine.  ?She continues exercising regularly 3x weekly.  ? ? ?Past Medical History:  ?Diagnosis Date  ? Anemia   ? slight per pt.  ? Depression   ? GERD (gastroesophageal reflux disease)   ? in past per pt.  ? Headache   ? Hypertension   ? Thyroid disease   ? slightly low.  ? Trigeminal neuralgia   ? ? ?Past Surgical History:  ?Procedure Laterality Date  ? COLONOSCOPY  2011  ? INDUCED ABORTION    ? TONSILLECTOMY    ? ? ?Family History  ?Problem Relation Age of Onset  ? Stroke Mother   ?     stroke  ? Asthma Mother   ? Hypertension Mother   ? Other Father   ?     unsure of medical history  ? Colon cancer Neg Hx   ? Colon polyps Neg Hx   ? Esophageal cancer Neg Hx   ? Rectal cancer Neg Hx   ? Stomach cancer Neg Hx   ? ? ?Social History  ? ?Socioeconomic History  ? Marital status: Married  ?  Spouse name: Not on file  ? Number of children: 3  ?  Years of education: college  ? Highest education level: Not on file  ?Occupational History  ? Occupation: bus driver  ?  Employer: [employer]  ?Tobacco Use  ? Smoking status: Never  ? Smokeless tobacco: Never  ? Tobacco comments:  ?  NEVER USED TOBACCO  ?Vaping Use  ? Vaping Use: Never used  ?Substance and Sexual Activity  ? Alcohol use: Yes  ?  Alcohol/week: 0.0 standard drinks  ?  Comment: rare--  1-2 x a month  ? Drug use: Never  ? Sexual activity: Yes  ?  Partners: Male  ?  Birth control/protection: None  ?Other Topics Concern  ? Not on file  ?Social History Narrative  ? Lives with family  ? Right-handed.  ? No daily caffeine use.  ? Exercise-- crunches, jumping jacks etc, 3x  a week  ? ?Social Determinants of Health  ? ?Financial Resource Strain: Not on file  ?Food Insecurity: Not on file  ?Transportation Needs: Not on file  ?Physical Activity: Not on file  ?Stress: Not on file  ?Social Connections: Not on file  ?Intimate Partner Violence: Not on file  ? ? ?  Outpatient Medications Prior to Visit  ?Medication Sig Dispense Refill  ? Multiple Vitamin (MULTIVITAMIN) tablet Take 1 tablet by mouth daily.    ? Multiple Vitamins-Minerals (IMMUNE SUPPORT VITAMIN C PO) Take by mouth.    ? pregabalin (LYRICA) 100 MG capsule Take 1 capsule by mouth twice daily 180 capsule 0  ? pregabalin (LYRICA) 75 MG capsule Take 1 capsule (75 mg total) by mouth 3 (three) times daily. 270 capsule 3  ? ?No facility-administered medications prior to visit.  ? ? ?No Known Allergies ? ?Review of Systems  ?Constitutional:  Negative for fever.  ?HENT:  Negative for congestion, sinus pain and sore throat.   ?Respiratory:  Negative for cough, shortness of breath and wheezing.   ?Cardiovascular:  Negative for chest pain and palpitations.  ?Gastrointestinal:  Negative for blood in stool, constipation, diarrhea, nausea and vomiting.  ?Genitourinary:  Negative for dysuria, frequency and hematuria.  ?Musculoskeletal:  Negative for joint  pain and myalgias.  ?Skin:   ?     (-)New moles  ?Neurological:  Negative for headaches.  ? ?   ?Objective:  ?  ?Physical Exam ?Constitutional:   ?   General: She is not in acute distress. ?   Appearance: Normal appearance. She is not ill-appearing.  ?HENT:  ?   Head: Normocephalic and atraumatic.  ?   Right Ear: Tympanic membrane, ear canal and external ear normal.  ?   Left Ear: Tympanic membrane, ear canal and external ear normal.  ?Eyes:  ?   Extraocular Movements: Extraocular movements intact.  ?   Pupils: Pupils are equal, round, and reactive to light.  ?Cardiovascular:  ?   Rate and Rhythm: Normal rate and regular rhythm.  ?   Heart sounds: Normal heart sounds. No murmur heard. ?  No gallop.  ?Pulmonary:  ?   Effort: Pulmonary effort is normal. No respiratory distress.  ?   Breath sounds: Normal breath sounds. No wheezing or rales.  ?Abdominal:  ?   General: Bowel sounds are normal. There is no distension.  ?   Palpations: Abdomen is soft.  ?   Tenderness: There is no abdominal tenderness. There is no guarding.  ?Skin: ?   General: Skin is warm and dry.  ?Neurological:  ?   Mental Status: She is alert and oriented to person, place, and time.  ?Psychiatric:     ?   Judgment: Judgment normal.  ? ? ?BP 122/82 (BP Location: Left Arm, Patient Position: Sitting, Cuff Size: Normal)   Pulse (!) 51   Temp 97.6 ?F (36.4 ?C) (Oral)   Resp 18   Ht '5\' 5"'$  (1.651 m)   Wt 162 lb 12.8 oz (73.8 kg)   LMP 04/01/2016   SpO2 95%   BMI 27.09 kg/m?  ?Wt Readings from Last 3 Encounters:  ?12/05/21 162 lb 12.8 oz (73.8 kg)  ?07/23/21 160 lb 6.4 oz (72.8 kg)  ?04/18/21 158 lb (71.7 kg)  ? ? ?Diabetic Foot Exam - Simple   ?No data filed ?  ? ?Lab Results  ?Component Value Date  ? WBC 2.1 Repeated and verified X2. (L) 11/05/2020  ? HGB 11.9 (L) 11/05/2020  ? HCT 36.2 11/05/2020  ? PLT 104.0 (L) 11/05/2020  ? GLUCOSE 90 11/05/2020  ? CHOL 181 11/05/2020  ? TRIG 65.0 11/05/2020  ? HDL 60.20 11/05/2020  ? LDLCALC 108 (H) 11/05/2020   ? ALT 21 11/05/2020  ? AST 20 11/05/2020  ? NA 139 11/05/2020  ? K 4.3 11/05/2020  ?  CL 102 11/05/2020  ? CREATININE 1.01 11/05/2020  ? BUN 14 11/05/2020  ? CO2 31 11/05/2020  ? TSH 0.46 07/23/2021  ? INR 1.1 03/15/2008  ? HGBA1C 5.3 09/05/2016  ? MICROALBUR <0.2 09/22/2014  ? ? ?Lab Results  ?Component Value Date  ? TSH 0.46 07/23/2021  ? ?Lab Results  ?Component Value Date  ? WBC 2.1 Repeated and verified X2. (L) 11/05/2020  ? HGB 11.9 (L) 11/05/2020  ? HCT 36.2 11/05/2020  ? MCV 84.8 11/05/2020  ? PLT 104.0 (L) 11/05/2020  ? ?Lab Results  ?Component Value Date  ? NA 139 11/05/2020  ? K 4.3 11/05/2020  ? CO2 31 11/05/2020  ? GLUCOSE 90 11/05/2020  ? BUN 14 11/05/2020  ? CREATININE 1.01 11/05/2020  ? BILITOT 0.4 11/05/2020  ? ALKPHOS 43 11/05/2020  ? AST 20 11/05/2020  ? ALT 21 11/05/2020  ? PROT 7.5 11/05/2020  ? ALBUMIN 4.3 11/05/2020  ? CALCIUM 9.5 11/05/2020  ? ANIONGAP 1 (L) 05/22/2020  ? GFR 60.21 11/05/2020  ? ?Lab Results  ?Component Value Date  ? CHOL 181 11/05/2020  ? ?Lab Results  ?Component Value Date  ? HDL 60.20 11/05/2020  ? ?Lab Results  ?Component Value Date  ? LDLCALC 108 (H) 11/05/2020  ? ?Lab Results  ?Component Value Date  ? TRIG 65.0 11/05/2020  ? ?Lab Results  ?Component Value Date  ? CHOLHDL 3 11/05/2020  ? ?Lab Results  ?Component Value Date  ? HGBA1C 5.3 09/05/2016  ? ?Mammogram- Last completed 10/18/2021. Results are normal. Repeat in 1 year.  ?Pap Smear- Last completed 08/29/2019. Results are normal. Repeat in 3 years.  ?Colonoscopy- Last completed 04/27/2020. Results showed: ?- One 4 mm polyp in the ascending colon, removed with a cold snare. Resected and retrieved. ?- The distal rectum and anal verge are normal on retroflexion view. ?Otherwise results are normal. ? ?   ?Assessment & Plan:  ? ?Problem List Items Addressed This Visit   ? ?  ? Unprioritized  ? Other fatigue  ? Relevant Orders  ? CBC with Differential/Platelet  ? Comprehensive metabolic panel  ? TSH  ? Vitamin B12  ?  VITAMIN D 25 Hydroxy (Vit-D Deficiency, Fractures)  ? Lipid panel  ? TRIGEMINAL NEURALGIA  ?  F/u neuro  ? ?  ?  ? Thrombocytopenia ([place])  ?  Recheck wbc  ? ?  ?  ? Preventative health care - Primary  ?  ghm utd ?C

## 2021-12-24 ENCOUNTER — Encounter: Payer: Self-pay | Admitting: Family Medicine

## 2021-12-24 ENCOUNTER — Telehealth (INDEPENDENT_AMBULATORY_CARE_PROVIDER_SITE_OTHER): Payer: BC Managed Care – PPO | Admitting: Family Medicine

## 2021-12-24 DIAGNOSIS — R0602 Shortness of breath: Secondary | ICD-10-CM | POA: Diagnosis not present

## 2021-12-24 DIAGNOSIS — F418 Other specified anxiety disorders: Secondary | ICD-10-CM

## 2021-12-24 DIAGNOSIS — G500 Trigeminal neuralgia: Secondary | ICD-10-CM

## 2021-12-24 NOTE — Progress Notes (Signed)
? ? ?MyChart Video Visit ? ? ? ?Virtual Visit via Video Note  ? ?This visit type was conducted due to national recommendations for restrictions regarding the COVID-19 Pandemic (e.g. social distancing) in an effort to limit this patient's exposure and mitigate transmission in our community. This patient is at least at moderate risk for complications without adequate follow up. This format is felt to be most appropriate for this patient at this time. Physical exam was limited by quality of the video and audio technology used for the visit. Alinda Dooms  was able to get the patient set up on a video visit. ? ?Patient location: Home Patient and provider in visit ?Provider location: Office ? ?I discussed the limitations of evaluation and management by telemedicine and the availability of in person appointments. The patient expressed understanding and agreed to proceed. ? ?Visit Date: 12/24/2021 ? ?Today's healthcare provider: [name], DO  ? ? ? ?Subjective:  ? ? Patient ID: Jackie Casey, female    DOB: 09-26-1960, 61 y.o.   MRN: [id number] ? ?Chief Complaint  ?Patient presents with  ? Medical Accommodation  ? ? ?HPI ?Patient is in today for a video visit.  ? ?She complains of SOB when she gets hot. 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 When she is feeling hot and SOB she eventually started feeling like she will pass out. She is trying to drive a bus with air conditioning. She is requesting a letter for her workplace explaining her condition and reasoning for accomodation.  ? ? ?Past Medical History:  ?Diagnosis Date  ? Anemia   ? slight per pt.  ? Depression   ? GERD (gastroesophageal reflux disease)   ? in past per pt.  ? Headache   ? Hypertension   ? Thyroid disease   ? slightly low.  ? Trigeminal neuralgia   ? ? ?Past Surgical History:  ?Procedure Laterality Date  ? COLONOSCOPY  2011  ? INDUCED ABORTION    ?  TONSILLECTOMY    ? ? ?Family History  ?Problem Relation Age of Onset  ? Stroke Mother   ?     stroke  ? Asthma Mother   ? Hypertension Mother   ? Other Father   ?     unsure of medical history  ? Colon cancer Neg Hx   ? Colon polyps Neg Hx   ? Esophageal cancer Neg Hx   ? Rectal cancer Neg Hx   ? Stomach cancer Neg Hx   ? ? ?Social History  ? ?Socioeconomic History  ? Marital status: Married  ?  Spouse name: Not on file  ? Number of children: 3  ? Years of education: college  ? Highest education level: Not on file  ?Occupational History  ? Occupation: bus driver  ?  Employer: [employer]  ?Tobacco Use  ? Smoking status: Never  ? Smokeless tobacco: Never  ? Tobacco comments:  ?  NEVER USED TOBACCO  ?Vaping Use  ? Vaping Use: Never used  ?Substance and Sexual Activity  ? Alcohol use: Yes  ?  Alcohol/week: 0.0 standard drinks  ?  Comment: rare--  1-2 x a month  ? Drug use: Never  ? Sexual activity: Yes  ?  Partners: Male  ?  Birth control/protection: None  ?Other Topics Concern  ? Not on file  ?Social History Narrative  ? Lives with family  ?  Right-handed.  ? No daily caffeine use.  ? Exercise-- crunches, jumping jacks etc, 3x  a week  ? ?Social Determinants of Health  ? ?Financial Resource Strain: Not on file  ?Food Insecurity: Not on file  ?Transportation Needs: Not on file  ?Physical Activity: Not on file  ?Stress: Not on file  ?Social Connections: Not on file  ?Intimate Partner Violence: Not on file  ? ? ?Outpatient Medications Prior to Visit  ?Medication Sig Dispense Refill  ? Multiple Vitamin (MULTIVITAMIN) tablet Take 1 tablet by mouth daily.    ? Multiple Vitamins-Minerals (IMMUNE SUPPORT VITAMIN C PO) Take by mouth.    ? pregabalin (LYRICA) 100 MG capsule Take 1 capsule by mouth twice daily 180 capsule 0  ? pregabalin (LYRICA) 75 MG capsule Take 1 capsule (75 mg total) by mouth 3 (three) times daily. 270 capsule 3  ? ?No facility-administered medications prior to visit.  ? ? ?No Known  Allergies ? ?Review of Systems  ?Constitutional:  Negative for fever and malaise/fatigue.  ?HENT:  Negative for congestion.   ?Eyes:  Negative for blurred vision.  ?Respiratory:  Positive for shortness of breath (while hot). Negative for cough.   ?Cardiovascular:  Negative for chest pain, palpitations and leg swelling.  ?Gastrointestinal:  Negative for vomiting.  ?Musculoskeletal:  Negative for back pain.  ?Skin:  Negative for rash.  ?Neurological:  Negative for loss of consciousness and headaches.  ? ?   ?Objective:  ?  ?Physical Exam ?Vitals and nursing note reviewed.  ?Constitutional:   ?   General: She is not in acute distress. ?   Appearance: She is well-developed. She is not ill-appearing, toxic-appearing or diaphoretic.  ?Eyes:  ?   Conjunctiva/sclera: Conjunctivae normal.  ?Neck:  ?   Thyroid: No thyromegaly.  ?   Vascular: No JVD.  ?Pulmonary:  ?   Breath sounds: No rales.  ?Chest:  ?   Chest wall: No tenderness.  ?Neurological:  ?   Mental Status: She is alert and oriented to person, place, and time.  ? ? ?LMP 04/01/2016  ?Wt Readings from Last 3 Encounters:  ?12/05/21 162 lb 12.8 oz (73.8 kg)  ?07/23/21 160 lb 6.4 oz (72.8 kg)  ?04/18/21 158 lb (71.7 kg)  ? ? ?Diabetic Foot Exam - Simple   ?No data filed ?  ? ?Lab Results  ?Component Value Date  ? WBC 2.2 (L) 12/05/2021  ? HGB 11.6 (L) 12/05/2021  ? HCT 35.7 (L) 12/05/2021  ? PLT 93.0 (L) 12/05/2021  ? GLUCOSE 79 12/05/2021  ? CHOL 172 12/05/2021  ? TRIG 52.0 12/05/2021  ? HDL 63.50 12/05/2021  ? [place] 99 12/05/2021  ? ALT 21 12/05/2021  ? AST 23 12/05/2021  ? NA 137 12/05/2021  ? K 4.8 12/05/2021  ? CL 102 12/05/2021  ? CREATININE 0.98 12/05/2021  ? BUN 15 12/05/2021  ? CO2 31 12/05/2021  ? TSH 0.45 12/05/2021  ? INR 1.1 03/15/2008  ? HGBA1C 5.3 09/05/2016  ? MICROALBUR <0.2 09/22/2014  ? ? ?Lab Results  ?Component Value Date  ? TSH 0.45 12/05/2021  ? ?Lab Results  ?Component Value Date  ? WBC 2.2 (L) 12/05/2021  ? HGB 11.6 (L) 12/05/2021  ? HCT 35.7  (L) 12/05/2021  ? MCV 85.8 12/05/2021  ? PLT 93.0 (L) 12/05/2021  ? ?Lab Results  ?Component Value Date  ? NA 137 12/05/2021  ? K 4.8 12/05/2021  ? CO2 31 12/05/2021  ? GLUCOSE 79 12/05/2021  ? BUN 15 12/05/2021  ?  CREATININE 0.98 12/05/2021  ? BILITOT 0.5 12/05/2021  ? ALKPHOS 46 12/05/2021  ? AST 23 12/05/2021  ? ALT 21 12/05/2021  ? PROT 7.4 12/05/2021  ? ALBUMIN 4.3 12/05/2021  ? CALCIUM 9.4 12/05/2021  ? ANIONGAP 1 (L) 05/22/2020  ? GFR 61.96 12/05/2021  ? ?Lab Results  ?Component Value Date  ? CHOL 172 12/05/2021  ? ?Lab Results  ?Component Value Date  ? HDL 63.50 12/05/2021  ? ?Lab Results  ?Component Value Date  ? [place] 99 12/05/2021  ? ?Lab Results  ?Component Value Date  ? TRIG 52.0 12/05/2021  ? ?Lab Results  ?Component Value Date  ? CHOLHDL 3 12/05/2021  ? ?Lab Results  ?Component Value Date  ? HGBA1C 5.3 09/05/2016  ? ? ?   ?Assessment & Plan:  ? ?Problem List Items Addressed This Visit   ? ?  ? Unprioritized  ? TRIGEMINAL NEURALGIA  ?  Stable with meds ? ? ?  ?  ? Shortness of breath  ?  Note to work written to get an air conditionsed bus for work  ? ?  ?  ? Depression with anxiety  ?  Stable  ? ?  ?  ? ? ? ?No orders of the defined types were placed in this encounter. ? ? ?I discussed the assessment and treatment plan with the patient. The patient was provided an opportunity to ask questions and all were answered. The patient agreed with the plan and demonstrated an understanding of the instructions. ?  ?The patient was advised to call back or seek an in-person evaluation if the symptoms worsen or if the condition fails to improve as anticipated. ? ?Engineering geologist as a Education administrator for [employer], DO.,have documented all relevant documentation on the behalf of [name], DO,as directed by  [name], DO while in the presence of [name], DO. ? ?I provided 20 minutes of face-to-face time during this encounter. ? ? ?[name], DO ?Arts development officer at [employer] ?[phone] (phone) ?[phone] (fax) ? ?[facility]  ?

## 2021-12-24 NOTE — Assessment & Plan Note (Signed)
Note to work written to get an air conditionsed bus for work  ?

## 2021-12-24 NOTE — Assessment & Plan Note (Signed)
Stable with meds 

## 2021-12-24 NOTE — Assessment & Plan Note (Signed)
Stable

## 2021-12-24 NOTE — Telephone Encounter (Signed)
Error

## 2022-02-04 ENCOUNTER — Ambulatory Visit (INDEPENDENT_AMBULATORY_CARE_PROVIDER_SITE_OTHER): Payer: BC Managed Care – PPO | Admitting: Family Medicine

## 2022-02-04 ENCOUNTER — Encounter: Payer: Self-pay | Admitting: Family Medicine

## 2022-02-04 VITALS — BP 121/71 | HR 56 | Temp 97.9°F | Resp 16 | Ht 65.0 in | Wt 160.0 lb

## 2022-02-04 DIAGNOSIS — G8929 Other chronic pain: Secondary | ICD-10-CM | POA: Diagnosis not present

## 2022-02-04 DIAGNOSIS — M25562 Pain in left knee: Secondary | ICD-10-CM | POA: Diagnosis not present

## 2022-02-04 MED ORDER — DICLOFENAC SODIUM 1 % EX GEL: 4.0000 g | Freq: Four times a day (QID) | CUTANEOUS | 3 refills | Status: DC

## 2022-02-05 ENCOUNTER — Telehealth: Payer: Self-pay

## 2022-02-05 ENCOUNTER — Other Ambulatory Visit: Payer: Self-pay | Admitting: Family Medicine

## 2022-02-05 DIAGNOSIS — G8929 Other chronic pain: Secondary | ICD-10-CM

## 2022-02-05 MED ORDER — MELOXICAM 7.5 MG PO TABS: ORAL_TABLET | ORAL | 2 refills | Status: DC

## 2022-02-05 NOTE — Telephone Encounter (Signed)
Coverage for this medication is denied for the following reason(s). We reviewed the information we received about your condition and circumstances. We used the plan approved policy when making this decision. The policy states the requested drug may be covered when the member has concern of an intolerance or contraindication to oral non-steroidal anti-inflammatory drugs (NSAIDs). Based on the policy and the information we received, your request was denied. The request was denied because we did not receive information that you have concern of an intolerance or contraindication to oral NSAIDs.

## 2022-02-05 NOTE — Telephone Encounter (Signed)
Key: [id number]  Sent to plan

## 2022-02-07 DIAGNOSIS — G8929 Other chronic pain: Secondary | ICD-10-CM | POA: Insufficient documentation

## 2022-02-07 NOTE — Progress Notes (Signed)
Established Patient Office Visit  Subjective   Patient ID: Jackie Casey, female    DOB: Jan 01, 1961  Age: 61 y.o. MRN: [id number]  Chief Complaint  Patient presents with   Knee Pain    Left side     HPI PT IS here c/o knee pain that has worsened since she was but in a shorter bus.    Patient Active Problem List   Diagnosis Date Noted   Chronic pain of left knee 02/07/2022   Stress at work 04/18/2021   New onset headache 01/24/2021   Multinodular goiter 06/05/2020   Subclinical hyperthyroidism 06/05/2020   Lymphadenopathy of head and neck 05/16/2020   Chronic leukopenia 05/16/2020   Skin rash 05/16/2020   Thrombocytopenia ([place]) 05/16/2020   Healthcare maintenance 05/08/2020   Shortness of breath 05/08/2020   Other fatigue 04/08/2020   Dizziness 04/08/2020   Abnormal stress ECG with treadmill 01/19/2020   Sinus headache 11/07/2019   Dyspepsia 11/07/2019   Chest pain 11/07/2019   Chest pressure 08/29/2019   Preventative health care 08/29/2019   Depression with anxiety 10/14/2015   Abdominal bloating 09/10/2012   GUAIAC POSITIVE STOOL 11/29/2009   OTHER SPECIFIED DISEASE OF WHITE BLOOD CELLS 01/21/2008   TRIGEMINAL NEURALGIA 11/25/2007   PALPITATIONS, OCCASIONAL 11/25/2007   Past Medical History:  Diagnosis Date   Anemia    slight per pt.   Depression    GERD (gastroesophageal reflux disease)    in past per pt.   Headache    Hypertension    Thyroid disease    slightly low.   Trigeminal neuralgia    Past Surgical History:  Procedure Laterality Date   COLONOSCOPY  2011   INDUCED ABORTION     TONSILLECTOMY     Social History   Tobacco Use   Smoking status: Never   Smokeless tobacco: Never   Tobacco comments:    NEVER USED TOBACCO  Vaping Use   Vaping Use: Never used  Substance Use Topics   Alcohol use: Yes    Alcohol/week: 0.0 standard drinks of alcohol    Comment: rare--  1-2 x a month   Drug use: Never   Social History   Socioeconomic  History   Marital status: Married    Spouse name: Not on file   Number of children: 3   Years of education: college   Highest education level: Not on file  Occupational History   Occupation: bus Education administrator: [employer]  Tobacco Use   Smoking status: Never   Smokeless tobacco: Never   Tobacco comments:    NEVER USED TOBACCO  Vaping Use   Vaping Use: Never used  Substance and Sexual Activity   Alcohol use: Yes    Alcohol/week: 0.0 standard drinks of alcohol    Comment: rare--  1-2 x a month   Drug use: Never   Sexual activity: Yes    Partners: Male    Birth control/protection: None  Other Topics Concern   Not on file  Social History Narrative   Lives with family   Right-handed.   No daily caffeine use.   Exercise-- crunches, jumping jacks etc, 3x  a week   Social Determinants of Health   Financial Resource Strain: Not on file  Food Insecurity: Not on file  Transportation Needs: Not on file  Physical Activity: Not on file  Stress: Not on file  Social Connections: Not on file  Intimate Partner Violence: Not on file   Family Status  Relation Name Status   Mother  Deceased   Father  Deceased   Sister  Alive   Brother  Alive   Brother  Alive   Brother  Alive   MGM  Deceased   MGF  Deceased   PGM  Deceased   PGF  Deceased   Son  Alive   Son  Alive   Son  Alive   Neg Hx  (Not Specified)   Family History  Problem Relation Age of Onset   Stroke Mother        stroke   Asthma Mother    Hypertension Mother    Other Father        unsure of medical history   Colon cancer Neg Hx    Colon polyps Neg Hx    Esophageal cancer Neg Hx    Rectal cancer Neg Hx    Stomach cancer Neg Hx    No Known Allergies    ROS    Objective:     BP 121/71 (BP Location: Right Arm, Cuff Size: Large)   Pulse (!) 56   Temp 97.9 F (36.6 C) (Oral)   Resp 16   Ht '5\' 5"'$  (1.651 m)   Wt 160 lb (72.6 kg)   LMP 04/01/2016   SpO2 98%   BMI 26.63 kg/m  BP  Readings from Last 3 Encounters:  02/04/22 121/71  12/05/21 122/82  07/23/21 120/82   Wt Readings from Last 3 Encounters:  02/04/22 160 lb (72.6 kg)  12/05/21 162 lb 12.8 oz (73.8 kg)  07/23/21 160 lb 6.4 oz (72.8 kg)   SpO2 Readings from Last 3 Encounters:  02/04/22 98%  12/05/21 95%  07/23/21 99%      Physical Exam   No results found for any visits on 02/04/22.  Last CBC Lab Results  Component Value Date   WBC 2.2 (L) 12/05/2021   HGB 11.6 (L) 12/05/2021   HCT 35.7 (L) 12/05/2021   MCV 85.8 12/05/2021   MCH 27.5 05/22/2020   RDW 13.3 12/05/2021   PLT 93.0 (L) 49/44/9675   Last metabolic panel Lab Results  Component Value Date   GLUCOSE 79 12/05/2021   NA 137 12/05/2021   K 4.8 12/05/2021   CL 102 12/05/2021   CO2 31 12/05/2021   BUN 15 12/05/2021   CREATININE 0.98 12/05/2021   GFRNONAA >60 05/22/2020   CALCIUM 9.4 12/05/2021   PROT 7.4 12/05/2021   ALBUMIN 4.3 12/05/2021   BILITOT 0.5 12/05/2021   ALKPHOS 46 12/05/2021   AST 23 12/05/2021   ALT 21 12/05/2021   ANIONGAP 1 (L) 05/22/2020   Last lipids Lab Results  Component Value Date   CHOL 172 12/05/2021   HDL 63.50 12/05/2021   LDLCALC 99 12/05/2021   TRIG 52.0 12/05/2021   CHOLHDL 3 12/05/2021   Last hemoglobin A1c Lab Results  Component Value Date   HGBA1C 5.3 09/05/2016   Last thyroid functions Lab Results  Component Value Date   TSH 0.45 12/05/2021   T3TOTAL 88 04/24/2020   T4TOTAL 7.5 07/12/2019   Last vitamin D Lab Results  Component Value Date   VD25OH 50.14 12/05/2021   Last vitamin B12 and Folate Lab Results  Component Value Date   VITAMINB12 1,082 (H) 12/05/2021   FOLATE >20.0 03/15/2008      The 10-year ASCVD risk score (Arnett DK, et al., 2019) is: 4.4%    Assessment & Plan:   Problem List Items Addressed This Visit  Unprioritized   Chronic pain of left knee - Primary    SEEMS TO BE WORSENING  REFER TO SPORT MED       Relevant Orders   Ambulatory  referral to Sports Medicine    Return if symptoms worsen or fail to improve.    [name], DO

## 2022-02-07 NOTE — Assessment & Plan Note (Signed)
SEEMS TO BE WORSENING  REFER TO SPORT MED

## 2022-02-18 NOTE — Progress Notes (Unsigned)
    Subjective:   I, Jackie Casey, am serving as a scribe for Dr. Lynne Leader.  CC: L knee pain  HPI: Pt is a 61 y/o female presenting w/ c/o L knee pain x2 months.  She locates her pain to to left lateral knee. 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  L knee swelling: yes in lateral Left knee L knee mechanical symptoms: Aggravating factors: when sitting too long the pain is worse  Treatments tried: has not been driving for a couple a months, exercise, brace, OTC rub, Ice   Pertinent review of Systems: No fevers or chills  Relevant historical information: Trigeminal neuralgia.   Objective:    Vitals:   02/19/22 1327  BP: 130/80  Pulse: (!) 53  SpO2: 99%   General: Well Developed, well nourished, and in no acute distress.   MSK: Left knee: Mild effusion normal-appearing otherwise.  Normal motion with crepitation.  Tender palpation lateral joint line. Stable ligamentous exam. Intact strength..  Lab and Radiology Results  Diagnostic Limited MSK Ultrasound of: Left knee Quad tendon intact normal appearing Patellar tendon normal appearing Slight narrowing of the medial joint line. Lateral joint line normal-appearing No Baker's cyst. Impression: Mild DJD.   X-ray images left knee obtained today personally and independently interpreted. Mild medial compartment DJD. No acute fractures present. Await formal radiology review  Impression and Recommendations:    Assessment and Plan: 61 y.o. female with left knee pain.  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  Since she started the summer break her knee pain has reduced quite a bit.  We discussed options.  Plan for quad strengthening home exercise program and Voltaren gel.  Intra-articular steroid injection would  probably be helpful but she would like to avoid that for now.  Think it is reasonable to proceed with a conservative management approach over the summer and if her pain returns as the school year starts with return to schoolbus driving certainly she can return and I can do an injection at any time.Marland Kitchen  PDMP not reviewed this encounter. Orders Placed This Encounter  Procedures   [place] LIMITED JOINT SPACE STRUCTURES LOW LEFT(NO LINKED CHARGES)    Order Specific Question:   Reason for Exam (SYMPTOM  OR DIAGNOSIS REQUIRED)    Answer:   L knee pain    Order Specific Question:   Preferred imaging location?    Answer:   [facility]   DG Knee AP/LAT W/Sun[place] Left    Standing Status:   Future    Number of Occurrences:   1    Standing Expiration Date:   03/21/2022    Order Specific Question:   Reason for Exam (SYMPTOM  OR DIAGNOSIS REQUIRED)    Answer:   L knee pain    Order Specific Question:   Preferred imaging location?    Answer:   [facility]   No orders of the defined types were placed in this encounter.   Discussed warning signs or symptoms. Please see discharge instructions. Patient expresses understanding.   The above documentation has been reviewed and is accurate and complete Lynne Leader, M.D.

## 2022-02-19 ENCOUNTER — Ambulatory Visit (INDEPENDENT_AMBULATORY_CARE_PROVIDER_SITE_OTHER): Payer: BC Managed Care – PPO

## 2022-02-19 ENCOUNTER — Ambulatory Visit: Payer: BC Managed Care – PPO | Admitting: Family Medicine

## 2022-02-19 ENCOUNTER — Ambulatory Visit: Payer: Self-pay

## 2022-02-19 VITALS — BP 130/80 | HR 53 | Ht 65.0 in | Wt 155.0 lb

## 2022-02-19 DIAGNOSIS — M25562 Pain in left knee: Secondary | ICD-10-CM

## 2022-02-19 NOTE — Patient Instructions (Signed)
Thank you for coming in today.   Please use Voltaren gel (Generic Diclofenac Gel) up to 4x daily for pain as needed.  This is available over-the-counter as both the name brand Voltaren gel and the generic diclofenac gel.   Please get an Xray today before you leave   If the pain returns we can do an injection.   Continue to work on leg strength. Exercise bike is helpful.

## 2022-02-21 NOTE — Progress Notes (Signed)
X-ray images left knee looks pretty normal to radiology.

## 2022-06-17 ENCOUNTER — Other Ambulatory Visit: Payer: Self-pay | Admitting: Family Medicine

## 2022-06-17 NOTE — Telephone Encounter (Signed)
Requesting: Lyrica '100mg'$  Contract: None MEB:RAXE Last Visit:02/04/22 Next Visit: None Last Refill: 10/14/21 #180 and 0RF   Please Advise

## 2022-08-06 ENCOUNTER — Telehealth: Payer: Self-pay | Admitting: Neurology

## 2022-08-06 NOTE — Telephone Encounter (Signed)
LVM and sent mychart msg informing pt of appointment change - NP out

## 2022-08-12 ENCOUNTER — Ambulatory Visit: Payer: BC Managed Care – PPO | Admitting: Neurology

## 2022-08-12 ENCOUNTER — Encounter: Payer: Self-pay | Admitting: Neurology

## 2022-08-12 VITALS — BP 132/79 | HR 55 | Ht 65.0 in | Wt 159.5 lb

## 2022-08-12 DIAGNOSIS — G500 Trigeminal neuralgia: Secondary | ICD-10-CM

## 2022-08-12 NOTE — Progress Notes (Signed)
Chief Complaint  Patient presents with   Follow-up    Rm 13. Alone. C/o occasional tension headache. Would like to discuss orthodontics.   ASSESSMENT AND PLAN  Jackie Casey is a 62 y.o. female   1.  Chronic right  trigeminal neuralgia -Pain remains under good control -Continue Lyrica 100 mg twice a day -Possibility pain may worsen with orthodontic intervention, consider options available, can discuss further if needed   2.  Headache has some migraine features -Continue NSAIDs as needed -Call for worsening, return here on an as-needed basis  3.  Pancytopenia -Close follow-up with PCP  DIAGNOSTIC DATA (LABS, IMAGING, TESTING) - I reviewed patient records, labs, notes, testing and imaging myself where available.  Laboratory evaluation in March 2022: Normal TSH, CMP, CBC showed WBC of 2.1, hemoglobin of 11.1, platelet of 104, which is over about 4 baseline  Lipid panel, LDL MILDLY elevated 108, total cholesterol 181  CT head without contrast April 06, 2020 showed no significant   abnormality  CT neck of soft tissue October 2021, multiple hypodense thyroid nodules, largest in the left lobe measuring 1.4 cm   HISTORICAL  Jackie Casey is a 62 year old female, seen in request by her primary care physician Dr. Carollee Herter, Kendrick Fries for evaluation right trigeminal neuralgia, initial evaluation was on January 24, 2021  I reviewed and summarized the referring note.  Past medical history Chronic leukopenia, thrombocytopenia under close monitoring of Dr. Alvy Bimler, most recent visit was in Oct 2021, never received any treatment Lyrica '100mg'$  bid for trigeminal pain.  Reported history of intermittent right trigeminal facial pain since 2007, radiating pain from right temporal region right upper teeth, initially she was seen by dentist, tooth extraction did not help her symptoms, was finally diagnosed with trigeminal neuralgia, has been treated with Lyrica 100 mg twice a day, which works  well for her most of the time, denies significant side effect, reported have a short trial of Lyrica 100 mg 3 times a day in the past, works even better, with higher dose she barely has any symptoms, now 100 twice a day, she also feels intermittent right facial pain, short lasting, mild  She also complains of intermittent headaches, also across forehead, Tylenol and over-the-counter NSAIDs was helpful  Personally reviewed CT head without contrast August 2021 there was no acute abnormality.  Update August 12, 2021 SS: Remains on Lyrica 100 mg twice daily, didn't try the 75 mg TID. Current dosing works well for her. Right sided TN pain is under good control. No side effects. Works as Teacher, early years/pre. Considering invisalign.   PHYSICAL EXAM:   Vitals:   08/12/22 1150  BP: 132/79  Pulse: (!) 55  Weight: 159 lb 8 oz (72.3 kg)  Height: '5\' 5"'$  (1.651 m)    Not recorded    Physical Exam  General: The patient is alert and cooperative at the time of the examination.  Skin: No significant peripheral edema is noted.  Neurologic Exam  Mental status: The patient is alert and oriented x 3 at the time of the examination. The patient has apparent normal recent and remote memory, with an apparently normal attention span and concentration ability.   Cranial nerves: Facial symmetry is present. Speech is normal, no aphasia or dysarthria is noted. Extraocular movements are full. Visual fields are full.  Motor: The patient has good strength in all 4 extremities.  Sensory examination: Soft touch sensation is symmetric on the face, arms, and legs.  Coordination: The patient has  good finger-nose-finger and heel-to-shin bilaterally.  Gait and station: The patient has a normal gait.   Reflexes: Deep tendon reflexes are symmetric.  REVIEW OF SYSTEMS:  Full 14 system review of systems performed and notable only for as above All other review of systems were negative.   ALLERGIES: No Known  Allergies  HOME MEDICATIONS: Current Outpatient Medications  Medication Sig Dispense Refill   ELDERBERRY PO Take 1 tablet by mouth as needed.     Multiple Vitamin (MULTIVITAMIN) tablet Take 1 tablet by mouth daily.     Omega-3 Fatty Acids (FISH OIL PO) Take 1 capsule by mouth daily.     pregabalin (LYRICA) 100 MG capsule Take 1 capsule by mouth twice daily 180 capsule 0   No current facility-administered medications for this visit.    PAST MEDICAL HISTORY: Past Medical History:  Diagnosis Date   Anemia    slight per pt.   Depression    GERD (gastroesophageal reflux disease)    in past per pt.   Headache    Hypertension    Thyroid disease    slightly low.   Trigeminal neuralgia     PAST SURGICAL HISTORY: Past Surgical History:  Procedure Laterality Date   COLONOSCOPY  2011   INDUCED ABORTION     TONSILLECTOMY      FAMILY HISTORY: Family History  Problem Relation Age of Onset   Stroke Mother        stroke   Asthma Mother    Hypertension Mother    Other Father        unsure of medical history   Colon cancer Neg Hx    Colon polyps Neg Hx    Esophageal cancer Neg Hx    Rectal cancer Neg Hx    Stomach cancer Neg Hx     SOCIAL HISTORY: Social History   Socioeconomic History   Marital status: Married    Spouse name: Not on file   Number of children: 3   Years of education: college   Highest education level: Not on file  Occupational History   Occupation: bus Education administrator: [employer]  Tobacco Use   Smoking status: Never   Smokeless tobacco: Never   Tobacco comments:    NEVER USED TOBACCO  Vaping Use   Vaping Use: Never used  Substance and Sexual Activity   Alcohol use: Yes    Alcohol/week: 0.0 standard drinks of alcohol    Comment: rare--  1-2 x a month   Drug use: Never   Sexual activity: Yes    Partners: Male    Birth control/protection: None  Other Topics Concern   Not on file  Social History Narrative   Lives with family    Right-handed.   No daily caffeine use.   Exercise-- crunches, jumping jacks etc, 3x  a week   Social Determinants of Health   Financial Resource Strain: Not on file  Food Insecurity: Not on file  Transportation Needs: Not on file  Physical Activity: Not on file  Stress: Not on file  Social Connections: Not on file  Intimate Partner Violence: Not on file    Butler Denmark, Laqueta Jean, [facility] [address] [place], [place] [id number] [phone]

## 2022-09-18 LAB — HM MAMMOGRAPHY

## 2022-10-23 ENCOUNTER — Other Ambulatory Visit: Payer: Self-pay | Admitting: Family Medicine

## 2022-10-23 NOTE — Telephone Encounter (Signed)
Requesting: Lyrica '100mg'$   Contract: None UDS: None Last Visit: 12/05/21 Next Visit: 10/31/22 Last Refill: 06/17/22 #180 and 0RF   Please Advise

## 2022-10-31 ENCOUNTER — Ambulatory Visit (INDEPENDENT_AMBULATORY_CARE_PROVIDER_SITE_OTHER): Payer: BC Managed Care – PPO | Admitting: Family Medicine

## 2022-10-31 ENCOUNTER — Encounter: Payer: Self-pay | Admitting: Family Medicine

## 2022-10-31 ENCOUNTER — Other Ambulatory Visit (HOSPITAL_COMMUNITY)
Admission: RE | Admit: 2022-10-31 | Discharge: 2022-10-31 | Disposition: A | Discharge status: Home / Self Care | Payer: BC Managed Care – PPO | Source: Ambulatory Visit | Attending: Family Medicine | Admitting: Family Medicine

## 2022-10-31 VITALS — BP 110/70 | HR 52 | Temp 97.7°F | Resp 16 | Ht 65.0 in | Wt 153.8 lb

## 2022-10-31 DIAGNOSIS — Z23 Encounter for immunization: Secondary | ICD-10-CM | POA: Diagnosis not present

## 2022-10-31 DIAGNOSIS — Z0000 Encounter for general adult medical examination without abnormal findings: Secondary | ICD-10-CM | POA: Diagnosis not present

## 2022-10-31 DIAGNOSIS — G500 Trigeminal neuralgia: Secondary | ICD-10-CM | POA: Diagnosis not present

## 2022-10-31 LAB — LIPID PANEL
Cholesterol: 169 mg/dL (ref 0–200)
HDL: 62 mg/dL (ref >39.00)
LDL Cholesterol: 99 mg/dL (ref 0–99)
NonHDL: 106.71
Total CHOL/HDL Ratio: 3
Triglycerides: 41 mg/dL (ref 0.0–149.0)
VLDL: 8.2 mg/dL (ref 0.0–40.0)

## 2022-10-31 LAB — CBC WITH DIFFERENTIAL/PLATELET
Basophils Absolute: 0 10*3/uL (ref 0.0–0.1)
Basophils Relative: 1 % (ref 0.0–3.0)
Eosinophils Absolute: 0.1 10*3/uL (ref 0.0–0.7)
Eosinophils Relative: 3.3 % (ref 0.0–5.0)
HCT: 36.5 % (ref 36.0–46.0)
Hemoglobin: 11.7 g/dL — ABNORMAL LOW (ref 12.0–15.0)
Lymphocytes Relative: 41.7 % (ref 12.0–46.0)
Lymphs Abs: 0.8 10*3/uL (ref 0.7–4.0)
MCHC: 32.1 g/dL (ref 30.0–36.0)
MCV: 84.9 fl (ref 78.0–100.0)
Monocytes Absolute: 0.2 10*3/uL (ref 0.1–1.0)
Monocytes Relative: 10.9 % (ref 3.0–12.0)
Neutro Abs: 0.9 10*3/uL — ABNORMAL LOW (ref 1.4–7.7)
Neutrophils Relative %: 43.1 % (ref 43.0–77.0)
Platelets: 134 10*3/uL — ABNORMAL LOW (ref 150.0–400.0)
RBC: 4.3 Mil/uL (ref 3.87–5.11)
RDW: 13.4 % (ref 11.5–15.5)
WBC: 2 10*3/uL — ABNORMAL LOW (ref 4.0–10.5)

## 2022-10-31 LAB — COMPREHENSIVE METABOLIC PANEL
ALT: 19 U/L (ref 0–35)
AST: 21 U/L (ref 0–37)
Albumin: 4.3 g/dL (ref 3.5–5.2)
Alkaline Phosphatase: 50 U/L (ref 39–117)
BUN: 10 mg/dL (ref 6–23)
CO2: 31 mEq/L (ref 19–32)
Calcium: 9.6 mg/dL (ref 8.4–10.5)
Chloride: 103 mEq/L (ref 96–112)
Creatinine, Ser: 1.01 mg/dL (ref 0.40–1.20)
GFR: 59.38 mL/min — ABNORMAL LOW (ref >60.00)
Glucose, Bld: 82 mg/dL (ref 70–99)
Potassium: 4.8 mEq/L (ref 3.5–5.1)
Sodium: 141 mEq/L (ref 135–145)
Total Bilirubin: 0.4 mg/dL (ref 0.2–1.2)
Total Protein: 7.7 g/dL (ref 6.0–8.3)

## 2022-10-31 LAB — TSH: TSH: 0.48 u[IU]/mL (ref 0.35–5.50)

## 2022-10-31 MED ORDER — PREGABALIN 100 MG PO CAPS: 100.0000 mg | ORAL_CAPSULE | Freq: Two times a day (BID) | ORAL | 3 refills | Status: DC

## 2022-10-31 NOTE — Patient Instructions (Signed)
Preventive Care 40-64 Years Old, Female Preventive care refers to lifestyle choices and visits with your health care provider that can promote health and wellness. Preventive care visits are also called wellness exams. What can I expect for my preventive care visit? Counseling Your health care provider may ask you questions about your: Medical history, including: Past medical problems. Family medical history. Pregnancy history. Current health, including: Menstrual cycle. Method of birth control. Emotional well-being. Home life and relationship well-being. Sexual activity and sexual health. Lifestyle, including: Alcohol, nicotine or tobacco, and drug use. Access to firearms. Diet, exercise, and sleep habits. Work and work environment. Sunscreen use. Safety issues such as seatbelt and bike helmet use. Physical exam Your health care provider will check your: Height and weight. These may be used to calculate your BMI (body mass index). BMI is a measurement that tells if you are at a healthy weight. Waist circumference. This measures the distance around your waistline. This measurement also tells if you are at a healthy weight and may help predict your risk of certain diseases, such as type 2 diabetes and high blood pressure. Heart rate and blood pressure. Body temperature. Skin for abnormal spots. What immunizations do I need?  Vaccines are usually given at various ages, according to a schedule. Your health care provider will recommend vaccines for you based on your age, medical history, and lifestyle or other factors, such as travel or where you work. What tests do I need? Screening Your health care provider may recommend screening tests for certain conditions. This may include: Lipid and cholesterol levels. Diabetes screening. This is done by checking your blood sugar (glucose) after you have not eaten for a while (fasting). Pelvic exam and Pap test. Hepatitis B test. Hepatitis C  test. HIV (human immunodeficiency virus) test. STI (sexually transmitted infection) testing, if you are at risk. Lung cancer screening. Colorectal cancer screening. Mammogram. Talk with your health care provider about when you should start having regular mammograms. This may depend on whether you have a family history of breast cancer. BRCA-related cancer screening. This may be done if you have a family history of breast, ovarian, tubal, or peritoneal cancers. Bone density scan. This is done to screen for osteoporosis. Talk with your health care provider about your test results, treatment options, and if necessary, the need for more tests. Follow these instructions at home: Eating and drinking  Eat a diet that includes fresh fruits and vegetables, whole grains, lean protein, and low-fat dairy products. Take vitamin and mineral supplements as recommended by your health care provider. Do not drink alcohol if: Your health care provider tells you not to drink. You are pregnant, may be pregnant, or are planning to become pregnant. If you drink alcohol: Limit how much you have to 0-1 drink a day. Know how much alcohol is in your drink. In the [place] one drink equals one 12 oz bottle of beer (355 mL), one 5 oz glass of wine (148 mL), or one 1 oz glass of hard liquor (44 mL). Lifestyle Brush your teeth every morning and night with fluoride toothpaste. Floss one time each day. Exercise for at least 30 minutes 5 or more days each week. Do not use any products that contain nicotine or tobacco. These products include cigarettes, chewing tobacco, and vaping devices, such as e-cigarettes. If you need help quitting, ask your health care provider. Do not use drugs. If you are sexually active, practice safe sex. Use a condom or other form of protection to   prevent STIs. If you do not wish to become pregnant, use a form of birth control. If you plan to become pregnant, see your health care provider for a  prepregnancy visit. Take aspirin only as told by your health care provider. Make sure that you understand how much to take and what form to take. Work with your health care provider to find out whether it is safe and beneficial for you to take aspirin daily. Find healthy ways to manage stress, such as: Meditation, yoga, or listening to music. Journaling. Talking to a trusted person. Spending time with friends and family. Minimize exposure to UV radiation to reduce your risk of skin cancer. Safety Always wear your seat belt while driving or riding in a vehicle. Do not drive: If you have been drinking alcohol. Do not ride with someone who has been drinking. When you are tired or distracted. While texting. If you have been using any mind-altering substances or drugs. Wear a helmet and other protective equipment during sports activities. If you have firearms in your house, make sure you follow all gun safety procedures. Seek help if you have been physically or sexually abused. What's next? Visit your health care provider once a year for an annual wellness visit. Ask your health care provider how often you should have your eyes and teeth checked. Stay up to date on all vaccines. This information is not intended to replace advice given to you by your health care provider. Make sure you discuss any questions you have with your health care provider. Document Revised: 01/23/2021 Document Reviewed: 01/23/2021 Elsevier Patient Education  2023 Elsevier Inc.  

## 2022-10-31 NOTE — Assessment & Plan Note (Signed)
Ghm utd Check labs See AVS  Health Maintenance Due  Topic Date Due   PAP SMEAR-Modifier  08/28/2022

## 2022-10-31 NOTE — Progress Notes (Addendum)
Subjective:   By signing my name below, I, Jackie Casey, attest that this documentation has been prepared under the direction and in the presence of Jackie Held, DO 10/31/22   Patient ID: Jackie Casey, female    DOB: 06-08-1961, 62 y.o.   MRN: [id number]  Chief Complaint  Patient presents with   Annual Exam    Pt states fasting     HPI Patient is in today for a comprehensive physical exam. She is overall well.   She reports a burn on her anterior left thigh about 2 weeks ago. She was boiling water and accidentally spilled it. She used an antibiotic topical cream and Frankincense oil, both helped. She denies any current pain and states the burn has healed well.   She is compliant with Lyrica and states it has been helping. She was previously encouraged to consider options of gamma knife. She is UTD on Rx refills.  Patient states she is regularly active and exercising.  Last colonoscopy: 04/27/2020. Results found one polyp which was removed. Otherwise normal.   Last mammogram: 09/18/2022. Results were normal.   Last pap: 08/29/2019. Results were normal.   She received her first shingles shot in 11/2021 and is receptive to receiving it today.   Patient is UTD on vision and dental care. She expressed interest in orthodontics.   She conducts self breast exams at home.  Past Medical History:  Diagnosis Date   Anemia    slight per pt.   Depression    GERD (gastroesophageal reflux disease)    in past per pt.   Headache    Hypertension    Thyroid disease    slightly low.   Trigeminal neuralgia     Past Surgical History:  Procedure Laterality Date   COLONOSCOPY  2011   INDUCED ABORTION     TONSILLECTOMY      Family History  Problem Relation Age of Onset   Stroke Mother        stroke   Asthma Mother    Hypertension Mother    Other Father        unsure of medical history   Colon cancer Neg Hx    Colon polyps Neg Hx    Esophageal cancer Neg Hx    Rectal  cancer Neg Hx    Stomach cancer Neg Hx     Social History   Socioeconomic History   Marital status: Married    Spouse name: Not on file   Number of children: 3   Years of education: college   Highest education level: Not on file  Occupational History   Occupation: bus Education administrator: [employer]  Tobacco Use   Smoking status: Never   Smokeless tobacco: Never   Tobacco comments:    NEVER USED TOBACCO  Vaping Use   Vaping Use: Never used  Substance and Sexual Activity   Alcohol use: Yes    Alcohol/week: 0.0 standard drinks of alcohol    Comment: rare--  1-2 x a month   Drug use: Never   Sexual activity: Yes    Partners: Male    Birth control/protection: None  Other Topics Concern   Not on file  Social History Narrative   Lives with family   Right-handed.   No daily caffeine use.   Exercise-- crunches, jumping jacks etc, 3x  a week   Social Determinants of Health   Financial Resource Strain: Not on file  Food Insecurity: Not  on file  Transportation Needs: Not on file  Physical Activity: Not on file  Stress: Not on file  Social Connections: Not on file  Intimate Partner Violence: Not on file    Outpatient Medications Prior to Visit  Medication Sig Dispense Refill   ELDERBERRY PO Take 1 tablet by mouth as needed.     Multiple Vitamin (MULTIVITAMIN) tablet Take 1 tablet by mouth daily.     Omega-3 Fatty Acids (FISH OIL PO) Take 1 capsule by mouth daily.     pregabalin (LYRICA) 100 MG capsule Take 1 capsule by mouth twice daily 180 capsule 0   No facility-administered medications prior to visit.    No Known Allergies  Review of Systems  Constitutional:  Negative for fever and malaise/fatigue.  HENT:  Negative for congestion.   Eyes:  Negative for blurred vision.  Respiratory:  Negative for cough and shortness of breath.   Cardiovascular:  Negative for chest pain, palpitations and leg swelling.  Gastrointestinal:  Negative for abdominal pain,  blood in stool, nausea and vomiting.  Genitourinary:  Negative for dysuria and frequency.  Musculoskeletal:  Negative for back pain and falls.  Skin:  Negative for rash.       (+) burn - anterior left thigh  Neurological:  Negative for dizziness, loss of consciousness and headaches.  Endo/Heme/Allergies:  Negative for environmental allergies.  Psychiatric/Behavioral:  Negative for depression. The patient is not nervous/anxious.        Objective:    Physical Exam Vitals and nursing note reviewed.  Constitutional:      General: She is not in acute distress.    Appearance: Normal appearance. She is well-developed.  HENT:     Head: Normocephalic and atraumatic.     Right Ear: External ear normal.     Left Ear: External ear normal.     Nose: Nose normal.     Mouth/Throat:     Mouth: Mucous membranes are moist.     Pharynx: Oropharynx is clear.  Eyes:     Extraocular Movements: Extraocular movements intact.     Conjunctiva/sclera: Conjunctivae normal.     Pupils: Pupils are equal, round, and reactive to light.  Neck:     Thyroid: No thyromegaly.     Vascular: No carotid bruit or JVD.  Cardiovascular:     Rate and Rhythm: Normal rate and regular rhythm.     Pulses: Normal pulses.     Heart sounds: Normal heart sounds. No murmur heard.    No gallop.  Pulmonary:     Effort: Pulmonary effort is normal. No respiratory distress.     Breath sounds: Normal breath sounds. No wheezing or rales.  Chest:     Chest wall: No tenderness.  Breasts:    Right: Normal.     Left: Normal.     Comments: Breast exam normal.  Abdominal:     General: Abdomen is flat.     Palpations: Abdomen is soft.     Tenderness: There is no abdominal tenderness. There is no guarding or rebound.  Musculoskeletal:        General: Normal range of motion.     Cervical back: Normal range of motion and neck supple.     Comments: Hypopigmentation on left inner thigh from burn.  Skin:    General: Skin is warm and  dry.  Neurological:     General: No focal deficit present.     Mental Status: She is alert and oriented to person, place, and  time.     Motor: No weakness.  Psychiatric:        Mood and Affect: Mood normal.        Behavior: Behavior normal.        Thought Content: Thought content normal.        Judgment: Judgment normal.     BP 110/70 (BP Location: Right Arm, Patient Position: Sitting, Cuff Size: Normal)   Pulse (!) 52   Temp 97.7 F (36.5 C) (Oral)   Resp 16   Ht 5\' 5"  (1.651 m)   Wt 153 lb 12.8 oz (69.8 kg)   LMP 04/01/2016   SpO2 100%   BMI 25.59 kg/m  Wt Readings from Last 3 Encounters:  10/31/22 153 lb 12.8 oz (69.8 kg)  08/12/22 159 lb 8 oz (72.3 kg)  02/19/22 155 lb (70.3 kg)       Assessment & Plan:  Preventative health care Assessment & Plan: Ghm utd Check labs See AVS  Health Maintenance Due  Topic Date Due   PAP SMEAR-Modifier  08/28/2022     Orders: -     Cytology - PAP -     CBC with Differential/Platelet -     Comprehensive metabolic panel -     Lipid panel -     TSH  Trigeminal neuralgia -     Pregabalin; Take 1 capsule (100 mg total) by mouth 2 (two) times daily.  Dispense: 180 capsule; Refill: 3  Need for shingles vaccine -     Varicella-zoster vaccine IM     I,Rachel Rivera,acting as a scribe for Jackie Held, DO.,have documented all relevant documentation on the behalf of Jackie Held, DO,as directed by  Jackie Held, DO while in the presence of Jackie Held, DO.   I, Jackie Held, DO, personally preformed the services described in this documentation.  All medical record entries made by the scribe were at my direction and in my presence.  I have reviewed the chart and discharge instructions (if applicable) and agree that the record reflects my personal performance and is accurate and complete. 10/31/22   Jackie Held, DO

## 2022-11-03 LAB — CYTOLOGY - PAP: Diagnosis: NEGATIVE

## 2022-12-29 ENCOUNTER — Encounter: Payer: Self-pay | Admitting: Family Medicine

## 2022-12-29 ENCOUNTER — Ambulatory Visit (HOSPITAL_BASED_OUTPATIENT_CLINIC_OR_DEPARTMENT_OTHER)
Admission: RE | Admit: 2022-12-29 | Discharge: 2022-12-29 | Disposition: A | Discharge status: Home / Self Care | Payer: BC Managed Care – PPO | Source: Ambulatory Visit | Attending: Family Medicine | Admitting: Family Medicine

## 2022-12-29 ENCOUNTER — Ambulatory Visit: Payer: BC Managed Care – PPO | Admitting: Family Medicine

## 2022-12-29 VITALS — BP 140/80 | HR 55 | Temp 98.2°F | Resp 18 | Ht 65.0 in | Wt 149.6 lb

## 2022-12-29 DIAGNOSIS — S0990XA Unspecified injury of head, initial encounter: Secondary | ICD-10-CM

## 2022-12-29 DIAGNOSIS — S139XXA Sprain of joints and ligaments of unspecified parts of neck, initial encounter: Secondary | ICD-10-CM

## 2022-12-29 MED ORDER — MELOXICAM 7.5 MG PO TABS: ORAL_TABLET | ORAL | 0 refills | Status: DC

## 2022-12-29 MED ORDER — CYCLOBENZAPRINE HCL 10 MG PO TABS: 10.0000 mg | ORAL_TABLET | Freq: Three times a day (TID) | ORAL | 0 refills | Status: DC | PRN

## 2022-12-29 NOTE — Assessment & Plan Note (Signed)
Pt landed on her head, no loc Ct head

## 2022-12-29 NOTE — Assessment & Plan Note (Signed)
Muscle relaxer as needed  Ice/ heat

## 2022-12-29 NOTE — Progress Notes (Signed)
Subjective:   By signing my name below, I, Carlena Bjornstad, attest that this documentation has been prepared under the direction and in the presence of Seabron Spates R, DO. 12/29/2022.    Patient ID: Jackie Casey, female    DOB: Apr 21, 1961, 62 y.o.   MRN: [id number]  Chief Complaint  Patient presents with   Fall    Pt states she fell downstairs last Thursday. Pt states she hit her head and hurt her neck. No UC or ER. Pt states having dizziness and headache, no blurred vision.     HPI Patient is in today for an office visit.  Last Thursday, she suffered a mechanical fall at home when she tripped and fell down her carpeted stairs. The bottom floor is wooden. At the time she was by herself. She denies loss of consciousness or significant bleeding. She believes that she fell backwards, and that her hands had been positioned behind her head when she fell.  Since the fall she has experienced dorsal neck pain and some dizziness. She has been trying to self medicate with tylenol.  During her physical examination she demonstrated good neck movement with some complaints of soreness.     Past Medical History:  Diagnosis Date   Anemia    slight per pt.   Depression    GERD (gastroesophageal reflux disease)    in past per pt.   Headache    Hypertension    Thyroid disease    slightly low.   Trigeminal neuralgia     Past Surgical History:  Procedure Laterality Date   COLONOSCOPY  2011   INDUCED ABORTION     TONSILLECTOMY      Family History  Problem Relation Age of Onset   Stroke Mother        stroke   Asthma Mother    Hypertension Mother    Other Father        unsure of medical history   Colon cancer Neg Hx    Colon polyps Neg Hx    Esophageal cancer Neg Hx    Rectal cancer Neg Hx    Stomach cancer Neg Hx     Social History   Socioeconomic History   Marital status: Married    Spouse name: Not on file   Number of children: 3   Years of education: college    Highest education level: Not on file  Occupational History   Occupation: bus Air traffic controller: [employer]  Tobacco Use   Smoking status: Never   Smokeless tobacco: Never   Tobacco comments:    NEVER USED TOBACCO  Vaping Use   Vaping Use: Never used  Substance and Sexual Activity   Alcohol use: Yes    Alcohol/week: 0.0 standard drinks of alcohol    Comment: rare--  1-2 x a month   Drug use: Never   Sexual activity: Yes    Partners: Male    Birth control/protection: None  Other Topics Concern   Not on file  Social History Narrative   Lives with family   Right-handed.   No daily caffeine use.   Exercise-- crunches, jumping jacks etc, 3x  a week   Social Determinants of Health   Financial Resource Strain: Not on file  Food Insecurity: Not on file  Transportation Needs: Not on file  Physical Activity: Not on file  Stress: Not on file  Social Connections: Not on file  Intimate Partner Violence: Not on file  Outpatient Medications Prior to Visit  Medication Sig Dispense Refill   ELDERBERRY PO Take 1 tablet by mouth as needed.     Multiple Vitamin (MULTIVITAMIN) tablet Take 1 tablet by mouth daily.     Omega-3 Fatty Acids (FISH OIL PO) Take 1 capsule by mouth daily.     pregabalin (LYRICA) 100 MG capsule Take 1 capsule (100 mg total) by mouth 2 (two) times daily. 180 capsule 3   No facility-administered medications prior to visit.    No Known Allergies  Review of Systems  Constitutional:  Negative for fever and malaise/fatigue.  HENT:  Negative for congestion.   Eyes:  Negative for blurred vision.  Respiratory:  Negative for shortness of breath.   Cardiovascular:  Negative for chest pain, palpitations and leg swelling.  Gastrointestinal:  Negative for abdominal pain, blood in stool and nausea.  Genitourinary:  Negative for dysuria and frequency.  Musculoskeletal:  Positive for falls (Mechanical fall) and neck pain (Dorsal).  Skin:  Negative for  rash.  Neurological:  Positive for dizziness. Negative for loss of consciousness.  Endo/Heme/Allergies:  Negative for environmental allergies.  Psychiatric/Behavioral:  Negative for depression. The patient is not nervous/anxious.        Objective:    Physical Exam Vitals and nursing note reviewed.  Constitutional:      Appearance: Normal appearance.  HENT:     Head: Normocephalic and atraumatic.     Right Ear: Tympanic membrane, ear canal and external ear normal.     Left Ear: Tympanic membrane, ear canal and external ear normal.  Eyes:     Extraocular Movements: Extraocular movements intact.     Pupils: Pupils are equal, round, and reactive to light.  Neck:     Comments: Tenderness to palpation of dorsal neck. Demonstrated good neck ROM with some associated soreness. Cardiovascular:     Rate and Rhythm: Normal rate and regular rhythm.     Heart sounds: Normal heart sounds. No murmur heard.    No gallop.  Pulmonary:     Effort: Pulmonary effort is normal. No respiratory distress.     Breath sounds: Normal breath sounds. No wheezing or rales.  Abdominal:     General: Bowel sounds are normal. There is no distension.     Palpations: Abdomen is soft.     Tenderness: There is no abdominal tenderness. There is no guarding.  Musculoskeletal:        General: Normal range of motion.  Skin:    General: Skin is warm and dry.  Neurological:     General: No focal deficit present.     Mental Status: She is alert and oriented to person, place, and time.     Coordination: Coordination is intact.  Psychiatric:        Mood and Affect: Mood normal.        Behavior: Behavior normal.     BP (!) 140/80 (BP Location: Right Arm, Patient Position: Sitting, Cuff Size: Normal)   Pulse (!) 55   Temp 98.2 F (36.8 C) (Oral)   Resp 18   Ht 5\' 5"  (1.651 m)   Wt 149 lb 9.6 oz (67.9 kg)   LMP 04/01/2016   SpO2 98%   BMI 24.89 kg/m  Wt Readings from Last 3 Encounters:  12/29/22 149 lb 9.6 oz  (67.9 kg)  10/31/22 153 lb 12.8 oz (69.8 kg)  08/12/22 159 lb 8 oz (72.3 kg)    Diabetic Foot Exam - Simple   No data filed  Lab Results  Component Value Date   WBC 2.0 Repeated and verified X2. (L) 10/31/2022   HGB 11.7 (L) 10/31/2022   HCT 36.5 10/31/2022   PLT 134.0 (L) 10/31/2022   GLUCOSE 82 10/31/2022   CHOL 169 10/31/2022   TRIG 41.0 10/31/2022   HDL 62.00 10/31/2022   LDLCALC 99 10/31/2022   ALT 19 10/31/2022   AST 21 10/31/2022   NA 141 10/31/2022   K 4.8 10/31/2022   CL 103 10/31/2022   CREATININE 1.01 10/31/2022   BUN 10 10/31/2022   CO2 31 10/31/2022   TSH 0.48 10/31/2022   INR 1.1 03/15/2008   HGBA1C 5.3 09/05/2016   MICROALBUR <0.2 09/22/2014    Lab Results  Component Value Date   TSH 0.48 10/31/2022   Lab Results  Component Value Date   WBC 2.0 Repeated and verified X2. (L) 10/31/2022   HGB 11.7 (L) 10/31/2022   HCT 36.5 10/31/2022   MCV 84.9 10/31/2022   PLT 134.0 (L) 10/31/2022   Lab Results  Component Value Date   NA 141 10/31/2022   K 4.8 10/31/2022   CO2 31 10/31/2022   GLUCOSE 82 10/31/2022   BUN 10 10/31/2022   CREATININE 1.01 10/31/2022   BILITOT 0.4 10/31/2022   ALKPHOS 50 10/31/2022   AST 21 10/31/2022   ALT 19 10/31/2022   PROT 7.7 10/31/2022   ALBUMIN 4.3 10/31/2022   CALCIUM 9.6 10/31/2022   ANIONGAP 1 (L) 05/22/2020   GFR 59.38 (L) 10/31/2022   Lab Results  Component Value Date   CHOL 169 10/31/2022   Lab Results  Component Value Date   HDL 62.00 10/31/2022   Lab Results  Component Value Date   LDLCALC 99 10/31/2022   Lab Results  Component Value Date   TRIG 41.0 10/31/2022   Lab Results  Component Value Date   CHOLHDL 3 10/31/2022   Lab Results  Component Value Date   HGBA1C 5.3 09/05/2016       Assessment & Plan:   Problem List Items Addressed This Visit       Unprioritized   Neck sprain    Muscle relaxer as needed  Ice/ heat        Relevant Medications   cyclobenzaprine (FLEXERIL)  10 MG tablet   meloxicam (MOBIC) 7.5 MG tablet   Head trauma, initial encounter - Primary    Pt landed on her head, no loc Ct head       Relevant Medications   meloxicam (MOBIC) 7.5 MG tablet   Other Relevant Orders   CT HEAD WO CONTRAST ( ) (Completed)     Meds ordered this encounter  Medications   cyclobenzaprine (FLEXERIL) 10 MG tablet    Sig: Take 1 tablet (10 mg total) by mouth 3 (three) times daily as needed for muscle spasms.    Dispense:  30 tablet    Refill:  0   meloxicam (MOBIC) 7.5 MG tablet    Sig: 1-2 po qd    Dispense:  30 tablet    Refill:  0    I, Donato Schultz, DO, personally preformed the services described in this documentation.  All medical record entries made by the scribe were at my direction and in my presence.  I have reviewed the chart and discharge instructions (if applicable) and agree that the record reflects my personal performance and is accurate and complete. 12/29/2022.  I,Mathew Stumpf,acting as a Neurosurgeon for [employer], DO.,have documented all relevant documentation on  the behalf of Donato Schultz, DO,as directed by  Donato Schultz, DO while in the presence of Donato Schultz, DO.   Donato Schultz, DO

## 2023-03-04 ENCOUNTER — Encounter: Payer: Self-pay | Admitting: Family Medicine

## 2023-03-04 ENCOUNTER — Telehealth: Payer: Self-pay | Admitting: Family Medicine

## 2023-03-04 DIAGNOSIS — L910 Hypertrophic scar: Secondary | ICD-10-CM

## 2023-03-04 NOTE — Telephone Encounter (Signed)
Okay to place referral

## 2023-03-04 NOTE — Telephone Encounter (Signed)
Pt states she would like a referral to dermatology for keloids. States she thinks the office she was referred to in 2016 has closed. She declined to make an appt with pcp as she does not think it is necessary.

## 2023-03-04 NOTE — Telephone Encounter (Signed)
Referral placed.

## 2023-03-20 ENCOUNTER — Telehealth: Payer: Self-pay | Admitting: Family Medicine

## 2023-03-20 NOTE — Telephone Encounter (Signed)
Pt stated she is having issues scheduling her dermatologist appt at the [facility] location her original referral was sent to. She requested to have her referral sent to another office if possible. Please advise pt on this matter.

## 2023-08-17 ENCOUNTER — Other Ambulatory Visit: Payer: Self-pay | Admitting: Family Medicine

## 2023-08-17 DIAGNOSIS — G500 Trigeminal neuralgia: Secondary | ICD-10-CM

## 2023-08-17 NOTE — Telephone Encounter (Signed)
 Requesting: Lyrica 100mg   Contract: None UDS: None Last Visit: 12/29/22 Next Visit: 11/03/23 Last Refill: 10/31/22 #180 and 3RF   Please Advise

## 2023-09-24 LAB — HM MAMMOGRAPHY

## 2023-10-08 ENCOUNTER — Telehealth: Payer: Self-pay | Admitting: Family Medicine

## 2023-10-08 NOTE — Telephone Encounter (Signed)
 Copied from CRM [id number]. Topic: General - Other >> Oct 08, 2023  1:52 PM Clayton Bibles wrote: Reason for CRM: Katrice will bring in a form today for doctor to sign that Dr. Zola Button prescribed  pregabalin (LYRICA) 100 MG capsule. This form is so she can drive.

## 2023-10-08 NOTE — Telephone Encounter (Signed)
Error

## 2023-10-08 NOTE — Telephone Encounter (Signed)
 Noted.

## 2023-11-03 ENCOUNTER — Encounter: Payer: BC Managed Care – PPO | Admitting: Family Medicine

## 2023-11-09 ENCOUNTER — Ambulatory Visit (INDEPENDENT_AMBULATORY_CARE_PROVIDER_SITE_OTHER): Payer: Self-pay | Admitting: Family Medicine

## 2023-11-09 VITALS — BP 130/80 | HR 62 | Temp 98.2°F | Resp 16 | Ht 65.0 in | Wt 159.0 lb

## 2023-11-09 DIAGNOSIS — Z23 Encounter for immunization: Secondary | ICD-10-CM | POA: Diagnosis not present

## 2023-11-09 DIAGNOSIS — G500 Trigeminal neuralgia: Secondary | ICD-10-CM | POA: Diagnosis not present

## 2023-11-09 DIAGNOSIS — Z0001 Encounter for general adult medical examination with abnormal findings: Secondary | ICD-10-CM | POA: Diagnosis not present

## 2023-11-09 DIAGNOSIS — Z0000 Encounter for general adult medical examination without abnormal findings: Secondary | ICD-10-CM

## 2023-11-09 DIAGNOSIS — Z13220 Encounter for screening for lipoid disorders: Secondary | ICD-10-CM

## 2023-11-09 LAB — CBC WITH DIFFERENTIAL/PLATELET
Basophils Absolute: 0 10*3/uL (ref 0.0–0.1)
Basophils Relative: 0.9 % (ref 0.0–3.0)
Eosinophils Absolute: 0.1 10*3/uL (ref 0.0–0.7)
Eosinophils Relative: 5.4 % — ABNORMAL HIGH (ref 0.0–5.0)
HCT: 36.7 % (ref 36.0–46.0)
Hemoglobin: 11.9 g/dL — ABNORMAL LOW (ref 12.0–15.0)
Lymphocytes Relative: 35.9 % (ref 12.0–46.0)
Lymphs Abs: 0.7 10*3/uL (ref 0.7–4.0)
MCHC: 32.5 g/dL (ref 30.0–36.0)
MCV: 85.8 fl (ref 78.0–100.0)
Monocytes Absolute: 0.2 10*3/uL (ref 0.1–1.0)
Monocytes Relative: 12 % (ref 3.0–12.0)
Neutro Abs: 0.9 10*3/uL — ABNORMAL LOW (ref 1.4–7.7)
Neutrophils Relative %: 45.8 % (ref 43.0–77.0)
Platelets: 128 10*3/uL — ABNORMAL LOW (ref 150.0–400.0)
RBC: 4.27 Mil/uL (ref 3.87–5.11)
RDW: 13.5 % (ref 11.5–15.5)
WBC: 2 10*3/uL — ABNORMAL LOW (ref 4.0–10.5)

## 2023-11-09 LAB — COMPREHENSIVE METABOLIC PANEL WITH GFR
ALT: 18 U/L (ref 0–35)
AST: 18 U/L (ref 0–37)
Albumin: 4.2 g/dL (ref 3.5–5.2)
Alkaline Phosphatase: 47 U/L (ref 39–117)
BUN: 14 mg/dL (ref 6–23)
CO2: 31 meq/L (ref 19–32)
Calcium: 9.4 mg/dL (ref 8.4–10.5)
Chloride: 104 meq/L (ref 96–112)
Creatinine, Ser: 0.96 mg/dL (ref 0.40–1.20)
GFR: 62.66 mL/min (ref >60.00)
Glucose, Bld: 85 mg/dL (ref 70–99)
Potassium: 4.5 meq/L (ref 3.5–5.1)
Sodium: 140 meq/L (ref 135–145)
Total Bilirubin: 0.7 mg/dL (ref 0.2–1.2)
Total Protein: 7.6 g/dL (ref 6.0–8.3)

## 2023-11-09 LAB — LIPID PANEL
Cholesterol: 167 mg/dL (ref 0–200)
HDL: 58.8 mg/dL (ref >39.00)
LDL Cholesterol: 95 mg/dL (ref 0–99)
NonHDL: 107.79
Total CHOL/HDL Ratio: 3
Triglycerides: 62 mg/dL (ref 0.0–149.0)
VLDL: 12.4 mg/dL (ref 0.0–40.0)

## 2023-11-09 LAB — TSH: TSH: 0.51 u[IU]/mL (ref 0.35–5.50)

## 2023-11-09 MED ORDER — PREGABALIN 100 MG PO CAPS: 100.0000 mg | ORAL_CAPSULE | Freq: Two times a day (BID) | ORAL | 0 refills | Status: DC

## 2023-11-09 NOTE — Assessment & Plan Note (Addendum)
 Assessment and Plan Assessment & Plan Neck Pain   She experiences intermittent neck pain, worsened by driving and head movement, with no history of neck injury. Previous imaging was normal. Pain medication and stretching exercises offer temporary relief. Heat therapy at night is recommended, and chiropractic care may be considered if symptoms persist. X-rays may be considered if pain continues or worsens.  General Health Maintenance   She is up to date with most vaccinations but has not received the flu shot this year. A new pneumonia vaccine is recommended for individuals 65 and older. Minor soreness may occur, but no major side effects are expected. Administer the pneumonia vaccine and perform blood work.  Ghm utd Check labs  See AV[facility] Health Maintenance  Topic Date Due   INFLUENZA VACCINE  11/09/2023 (Originally 03/12/2023)   COVID-19 Vaccine (4 - 2024-25 season) 11/25/2023 (Originally 04/12/2023)   Colonoscopy  04/27/2025   MAMMOGRAM  09/23/2025   Cervical Cancer Screening (HPV/Pap Cotest)  10/31/2027   DTaP/Tdap/Td (4 - Td or Tdap) 12/06/2031   Hepatitis C Screening  Completed   HIV Screening  Completed   Zoster Vaccines- Shingrix  Completed   Pneumococcal Vaccine 60-19 Years old  Aged Out   HPV VACCINES  Aged Out

## 2023-11-09 NOTE — Progress Notes (Signed)
 Established Patient Office Visit  Subjective   Patient ID: Jackie Casey, female    DOB: March 15, 1961  Age: 63 y.o. MRN: [id number]  Chief Complaint  Patient presents with   Annual Exam    Pt states fasting     HPI Discussed the use of AI scribe software for clinical note transcription with the patient, who gave verbal consent to proceed.  History of Present Illness Jackie Casey is a 63 year old female who presents with neck discomfort, especially when driving.  She experiences neck discomfort, particularly when driving. The discomfort is described as occurring 'all over' and is more pronounced when she leans to look while driving. It is sometimes present during sleep, prompting her to adjust her position for relief. She has tried pain medication once but generally relies on exercises and stretching to manage the symptoms. The discomfort tends to subside when she is not driving for a while.  She recalls a fall almost a year ago, in April, but does not believe it resulted in any neck injury. A CT scan performed at that time was normal. She has previously seen a chiropractor but has not continued due to the cost of copayments. Adjustments in her driving position help temporarily, but the discomfort persists, especially during turns.  No recent neck injuries are reported, and the discomfort is not present today, likely because she is not working. No pain is noted when touching or rubbing the area, and she can perform neck movements such as turning her head and putting her chin to her chest without significant pain.  She lives with her husband and is planning to travel to [place] towards the end of the year. No stomach issues, joint problems, or concerns about moles. She has not received a flu shot this year and is up to date with other vaccinations.   Patient Active Problem List   Diagnosis Date Noted   Neck sprain 12/29/2022   Head trauma, initial encounter 12/29/2022   Chronic pain  of left knee 02/07/2022   Stress at work 04/18/2021   New onset headache 01/24/2021   Multinodular goiter 06/05/2020   Subclinical hyperthyroidism 06/05/2020   Lymphadenopathy of head and neck 05/16/2020   Chronic leukopenia 05/16/2020   Skin rash 05/16/2020   Thrombocytopenia (HCC) 05/16/2020   Healthcare maintenance 05/08/2020   Shortness of breath 05/08/2020   Other fatigue 04/08/2020   Dizziness 04/08/2020   Abnormal stress ECG with treadmill 01/19/2020   Sinus headache 11/07/2019   Dyspepsia 11/07/2019   Chest pain 11/07/2019   Chest pressure 08/29/2019   Preventative health care 08/29/2019   Depression with anxiety 10/14/2015   Abdominal bloating 09/10/2012   GUAIAC POSITIVE STOOL 11/29/2009   OTHER SPECIFIED DISEASE OF WHITE BLOOD CELLS 01/21/2008   TRIGEMINAL NEURALGIA 11/25/2007   PALPITATIONS, OCCASIONAL 11/25/2007   Past Medical History:  Diagnosis Date   Anemia    slight per pt.   Depression    GERD (gastroesophageal reflux disease)    in past per pt.   Headache    Hypertension    Thyroid disease    slightly low.   Trigeminal neuralgia    Past Surgical History:  Procedure Laterality Date   COLONOSCOPY  2011   INDUCED ABORTION     TONSILLECTOMY     Social History   Tobacco Use   Smoking status: Never   Smokeless tobacco: Never   Tobacco comments:    NEVER USED TOBACCO  Vaping Use   Vaping status: Never Used  Substance Use Topics   Alcohol use: Yes    Alcohol/week: 0.0 standard drinks of alcohol    Comment: rare--  1-2 x a month   Drug use: Never   Social History   Socioeconomic History   Marital status: Married    Spouse name: Not on file   Number of children: 3   Years of education: college   Highest education level: Associate degree: academic program  Occupational History   Occupation: bus Air traffic controller: [employer]  Tobacco Use   Smoking status: Never   Smokeless tobacco: Never   Tobacco comments:    NEVER USED  TOBACCO  Vaping Use   Vaping status: Never Used  Substance and Sexual Activity   Alcohol use: Yes    Alcohol/week: 0.0 standard drinks of alcohol    Comment: rare--  1-2 x a month   Drug use: Never   Sexual activity: Yes    Partners: Male    Birth control/protection: None  Other Topics Concern   Not on file  Social History Narrative   Lives with family   Right-handed.   No daily caffeine use.   Exercise-- crunches, jumping jacks etc, 3x  a week   Social Drivers of Corporate investment banker Strain: Low Risk  (11/09/2023)   Overall Financial Resource Strain (CARDIA)    Difficulty of Paying Living Expenses: Not hard at all  Food Insecurity: No Food Insecurity (11/09/2023)   Hunger Vital Sign    Worried About Running Out of Food in the Last Year: Never true    Ran Out of Food in the Last Year: Never true  Transportation Needs: No Transportation Needs (11/09/2023)   PRAPARE - Administrator, Civil Service (Medical): No    Lack of Transportation (Non-Medical): No  Physical Activity: Sufficiently Active (11/09/2023)   Exercise Vital Sign    Days of Exercise per Week: 4 days    Minutes of Exercise per Session: 40 min  Stress: No Stress Concern Present (11/09/2023)   [employer] of Occupational Health - Occupational Stress Questionnaire    Feeling of Stress : Not at all  Social Connections: Socially Integrated (11/09/2023)   Social Connection and Isolation Panel [NHANES]    Frequency of Communication with Friends and Family: More than three times a week    Frequency of Social Gatherings with Friends and Family: More than three times a week    Attends Religious Services: More than 4 times per year    Active Member of [employer] or Organizations: Yes    Attends Engineer, structural: More than 4 times per year    Marital Status: Married  Catering manager Violence: Not on file   Family Status  Relation Name Status   Mother  Deceased   Father  Deceased   Sister   Alive   Brother  Alive   Brother  Alive   Brother  Alive   MGM  Deceased   MGF  Deceased   PGM  Deceased   PGF  Deceased   Son  Alive   Son  Alive   Son  Alive   Neg Hx  (Not Specified)  No partnership data on file   Family History  Problem Relation Age of Onset   Stroke Mother        stroke   Asthma Mother    Hypertension Mother    Other Father        unsure of medical  history   Colon cancer Neg Hx    Colon polyps Neg Hx    Esophageal cancer Neg Hx    Rectal cancer Neg Hx    Stomach cancer Neg Hx    No Known Allergies    ROS    Objective:     BP 130/80 (BP Location: Right Arm, Patient Position: Sitting)   Pulse 62   Temp 98.2 F (36.8 C) (Oral)   Resp 16   Ht 5\' 5"  (1.651 m)   Wt 159 lb (72.1 kg)   LMP 04/01/2016   SpO2 99%   BMI 26.46 kg/m  BP Readings from Last 3 Encounters:  11/09/23 130/80  12/29/22 (!) 140/80  10/31/22 110/70   Wt Readings from Last 3 Encounters:  11/09/23 159 lb (72.1 kg)  12/29/22 149 lb 9.6 oz (67.9 kg)  10/31/22 153 lb 12.8 oz (69.8 kg)   SpO2 Readings from Last 3 Encounters:  11/09/23 99%  12/29/22 98%  10/31/22 100%      Physical Exam   No results found for any visits on 11/09/23.  Last CBC Lab Results  Component Value Date   WBC 2.0 Repeated and verified X2. (L) 10/31/2022   HGB 11.7 (L) 10/31/2022   HCT 36.5 10/31/2022   MCV 84.9 10/31/2022   MCH 27.5 05/22/2020   RDW 13.4 10/31/2022   PLT 134.0 (L) 10/31/2022   Last metabolic panel Lab Results  Component Value Date   GLUCOSE 82 10/31/2022   NA 141 10/31/2022   K 4.8 10/31/2022   CL 103 10/31/2022   CO2 31 10/31/2022   BUN 10 10/31/2022   CREATININE 1.01 10/31/2022   GFR 59.38 (L) 10/31/2022   CALCIUM 9.6 10/31/2022   PROT 7.7 10/31/2022   ALBUMIN 4.3 10/31/2022   BILITOT 0.4 10/31/2022   ALKPHOS 50 10/31/2022   AST 21 10/31/2022   ALT 19 10/31/2022   ANIONGAP 1 (L) 05/22/2020   Last lipids Lab Results  Component Value Date   CHOL  169 10/31/2022   HDL 62.00 10/31/2022   LDLCALC 99 10/31/2022   TRIG 41.0 10/31/2022   CHOLHDL 3 10/31/2022   Last hemoglobin A1c Lab Results  Component Value Date   HGBA1C 5.3 09/05/2016   Last thyroid functions Lab Results  Component Value Date   TSH 0.48 10/31/2022   T3TOTAL 88 04/24/2020   T4TOTAL 7.5 07/12/2019   Last vitamin D Lab Results  Component Value Date   VD25OH 50.14 12/05/2021   Last vitamin B12 and Folate Lab Results  Component Value Date   VITAMINB12 1,082 (H) 12/05/2021   FOLATE >20.0 03/15/2008      The 10-year ASCVD risk score (Arnett DK, et al., 2019) is: 6.3%    Assessment & Plan:   Problem List Items Addressed This Visit       Unprioritized   TRIGEMINAL NEURALGIA   Relevant Medications   pregabalin (LYRICA) 100 MG capsule   Preventative health care - Primary   Assessment and Plan Assessment & Plan Neck Pain   She experiences intermittent neck pain, worsened by driving and head movement, with no history of neck injury. Previous imaging was normal. Pain medication and stretching exercises offer temporary relief. Heat therapy at night is recommended, and chiropractic care may be considered if symptoms persist. X-rays may be considered if pain continues or worsens.  General Health Maintenance   She is up to date with most vaccinations but has not received the flu shot this year. A new pneumonia vaccine  is recommended for individuals 67 and older. Minor soreness may occur, but no major side effects are expected. Administer the pneumonia vaccine and perform blood work.        Relevant Orders   CBC with Differential/Platelet   Comprehensive metabolic panel with GFR   Lipid panel   TSH   Other Visit Diagnoses       Need for pneumococcal 20-valent conjugate vaccination       Relevant Orders   Pneumococcal conjugate vaccine 20-valent (Prevnar 20) (Completed)     Assessment and Plan Assessment & Plan Neck Pain   She experiences  intermittent neck pain, worsened by driving and head movement, with no history of neck injury. Previous imaging was normal. Pain medication and stretching exercises offer temporary relief. Heat therapy at night is recommended, and chiropractic care may be considered if symptoms persist. X-rays may be considered if pain continues or worsens.  General Health Maintenance   She is up to date with most vaccinations but has not received the flu shot this year. A new pneumonia vaccine is recommended for individuals 65 and older. Minor soreness may occur, but no major side effects are expected. Administer the pneumonia vaccine and perform blood work.    No follow-ups on file.    Donato Schultz, DO

## 2023-11-09 NOTE — Patient Instructions (Signed)
 Preventive Care 16-64 Years Old, Female  Preventive care refers to lifestyle choices and visits with your health care provider that can promote health and wellness. Preventive care visits are also called wellness exams.  What can I expect for my preventive care visit?  Counseling  Your health care provider may ask you questions about your:  Medical history, including:  Past medical problems.  Family medical history.  Pregnancy history.  Current health, including:  Menstrual cycle.  Method of birth control.  Emotional well-being.  Home life and relationship well-being.  Sexual activity and sexual health.  Lifestyle, including:  Alcohol, nicotine or tobacco, and drug use.  Access to firearms.  Diet, exercise, and sleep habits.  Work and work Astronomer.  Sunscreen use.  Safety issues such as seatbelt and bike helmet use.  Physical exam  Your health care provider will check your:  Height and weight. These may be used to calculate your BMI (body mass index). BMI is a measurement that tells if you are at a healthy weight.  Waist circumference. This measures the distance around your waistline. This measurement also tells if you are at a healthy weight and may help predict your risk of certain diseases, such as type 2 diabetes and high blood pressure.  Heart rate and blood pressure.  Body temperature.  Skin for abnormal spots.  What immunizations do I need?    Vaccines are usually given at various ages, according to a schedule. Your health care provider will recommend vaccines for you based on your age, medical history, and lifestyle or other factors, such as travel or where you work.  What tests do I need?  Screening  Your health care provider may recommend screening tests for certain conditions. This may include:  Lipid and cholesterol levels.  Diabetes screening. This is done by checking your blood sugar (glucose) after you have not eaten for a while (fasting).  Pelvic exam and Pap test.  Hepatitis B test.  Hepatitis C  test.  HIV (human immunodeficiency virus) test.  STI (sexually transmitted infection) testing, if you are at risk.  Lung cancer screening.  Colorectal cancer screening.  Mammogram. Talk with your health care provider about when you should start having regular mammograms. This may depend on whether you have a family history of breast cancer.  BRCA-related cancer screening. This may be done if you have a family history of breast, ovarian, tubal, or peritoneal cancers.  Bone density scan. This is done to screen for osteoporosis.  Talk with your health care provider about your test results, treatment options, and if necessary, the need for more tests.  Follow these instructions at home:  Eating and drinking    Eat a diet that includes fresh fruits and vegetables, whole grains, lean protein, and low-fat dairy products.  Take vitamin and mineral supplements as recommended by your health care provider.  Do not drink alcohol if:  Your health care provider tells you not to drink.  You are pregnant, may be pregnant, or are planning to become pregnant.  If you drink alcohol:  Limit how much you have to 0-1 drink a day.  Know how much alcohol is in your drink. In the [place] one drink equals one 12 oz bottle of beer (355 mL), one 5 oz glass of wine (148 mL), or one 1 oz glass of hard liquor (44 mL).  Lifestyle  Brush your teeth every morning and night with fluoride toothpaste. Floss one time each day.  Exercise for at least  30 minutes 5 or more days each week.  Do not use any products that contain nicotine or tobacco. These products include cigarettes, chewing tobacco, and vaping devices, such as e-cigarettes. If you need help quitting, ask your health care provider.  Do not use drugs.  If you are sexually active, practice safe sex. Use a condom or other form of protection to prevent STIs.  If you do not wish to become pregnant, use a form of birth control. If you plan to become pregnant, see your health care provider for a  prepregnancy visit.  Take aspirin only as told by your health care provider. Make sure that you understand how much to take and what form to take. Work with your health care provider to find out whether it is safe and beneficial for you to take aspirin daily.  Find healthy ways to manage stress, such as:  Meditation, yoga, or listening to music.  Journaling.  Talking to a trusted person.  Spending time with friends and family.  Minimize exposure to UV radiation to reduce your risk of skin cancer.  Safety  Always wear your seat belt while driving or riding in a vehicle.  Do not drive:  If you have been drinking alcohol. Do not ride with someone who has been drinking.  When you are tired or distracted.  While texting.  If you have been using any mind-altering substances or drugs.  Wear a helmet and other protective equipment during sports activities.  If you have firearms in your house, make sure you follow all gun safety procedures.  Seek help if you have been physically or sexually abused.  What's next?  Visit your health care provider once a year for an annual wellness visit.  Ask your health care provider how often you should have your eyes and teeth checked.  Stay up to date on all vaccines.  This information is not intended to replace advice given to you by your health care provider. Make sure you discuss any questions you have with your health care provider.  Document Revised: 01/23/2021 Document Reviewed: 01/23/2021  Elsevier Patient Education  2024 [employer].

## 2023-11-14 ENCOUNTER — Encounter: Payer: Self-pay | Admitting: Family Medicine

## 2024-04-02 ENCOUNTER — Other Ambulatory Visit: Payer: Self-pay | Admitting: Family Medicine

## 2024-04-02 DIAGNOSIS — G500 Trigeminal neuralgia: Secondary | ICD-10-CM

## 2024-04-04 NOTE — Telephone Encounter (Signed)
 Requesting: Lyrica  100mg  Contract: None UDS: None Last Visit: 11/09/23 Next Visit: None Last Refill: 11/09/23 #180 and 0RF  Please Advise

## 2024-07-02 ENCOUNTER — Other Ambulatory Visit: Payer: Self-pay | Admitting: Family Medicine

## 2024-07-02 DIAGNOSIS — G500 Trigeminal neuralgia: Secondary | ICD-10-CM

## 2024-07-04 NOTE — Telephone Encounter (Signed)
 Got her scheduled

## 2024-07-04 NOTE — Telephone Encounter (Signed)
 Requesting: Lyrica  100mg   Contract: None UDS: None Last Visit: 11/09/23 Next Visit: 07/19/24 Last Refill: 04/04/24 #180 and  0RF   Please Advise

## 2024-07-04 NOTE — Telephone Encounter (Signed)
 Jannis- can you try reaching out to Pt? She is overdue for appt. Thank you.

## 2024-07-19 ENCOUNTER — Ambulatory Visit: Admitting: Family Medicine

## 2024-07-19 ENCOUNTER — Encounter: Payer: Self-pay | Admitting: Family Medicine

## 2024-07-19 VITALS — BP 142/86 | HR 55 | Temp 97.9°F | Resp 16 | Ht 65.0 in | Wt 158.0 lb

## 2024-07-19 DIAGNOSIS — G500 Trigeminal neuralgia: Secondary | ICD-10-CM

## 2024-07-19 DIAGNOSIS — Z23 Encounter for immunization: Secondary | ICD-10-CM

## 2024-07-19 MED ORDER — PREGABALIN 150 MG PO CAPS: 150.0000 mg | ORAL_CAPSULE | Freq: Two times a day (BID) | ORAL | 1 refills | Status: AC

## 2024-07-19 NOTE — Progress Notes (Signed)
 Subjective:    Patient ID: Jackie Casey, female    DOB: 1961-01-31, 63 y.o.   MRN: [id number]  Chief Complaint  Patient presents with   Follow-up    HPI Patient is in today for F/U.  Discussed the use of AI scribe software for clinical note transcription with the patient, who gave verbal consent to proceed.  History of Present Illness Jackie Casey is a 63 year old female who presents for a medication refill and management of neuropathic pain.  She has been taking Lyrica , initially prescribed by her neurologist, for neuropathic pain. The medication was originally prescribed to be taken three times a day, but she has been taking it twice daily as the pain was not severe initially. Recently, she has noticed a return of pain even with the twice-daily dosage.  She dislikes taking medication three times a day due to a busy schedule and difficulty remembering doses. She is currently taking 100 mg of Lyrica  twice a day.  She mentions that her blood pressure might be slightly elevated due to the stress of a two-hour delay in getting to the appointment.    Past Medical History:  Diagnosis Date   Anemia    slight per pt.   Depression    GERD (gastroesophageal reflux disease)    in past per pt.   Headache    Hypertension    Thyroid  disease    slightly low.   Trigeminal neuralgia     Past Surgical History:  Procedure Laterality Date   COLONOSCOPY  2011   INDUCED ABORTION     TONSILLECTOMY      Family History  Problem Relation Age of Onset   Stroke Mother        stroke   Asthma Mother    Hypertension Mother    Other Father        unsure of medical history   Colon cancer Neg Hx    Colon polyps Neg Hx    Esophageal cancer Neg Hx    Rectal cancer Neg Hx    Stomach cancer Neg Hx     Social History   Socioeconomic History   Marital status: Married    Spouse name: Not on file   Number of children: 3   Years of education: college   Highest education level: Associate  degree: academic program  Occupational History   Occupation: bus Air Traffic Controller: [employer]  Tobacco Use   Smoking status: Never   Smokeless tobacco: Never   Tobacco comments:    NEVER USED TOBACCO  Vaping Use   Vaping status: Never Used  Substance and Sexual Activity   Alcohol use: Yes    Alcohol/week: 0.0 standard drinks of alcohol    Comment: rare--  1-2 x a month   Drug use: Never   Sexual activity: Yes    Partners: Male    Birth control/protection: None  Other Topics Concern   Not on file  Social History Narrative   Lives with family   Right-handed.   No daily caffeine use.   Exercise-- crunches, jumping jacks etc, 3x  a week   Social Drivers of Corporate Investment Banker Strain: Low Risk  (11/09/2023)   Overall Financial Resource Strain (CARDIA)    Difficulty of Paying Living Expenses: Not hard at all  Food Insecurity: No Food Insecurity (11/09/2023)   Hunger Vital Sign    Worried About Running Out of Food in the Last Year: Never true  Ran Out of Food in the Last Year: Never true  Transportation Needs: No Transportation Needs (11/09/2023)   PRAPARE - Administrator, Civil Service (Medical): No    Lack of Transportation (Non-Medical): No  Physical Activity: Sufficiently Active (11/09/2023)   Exercise Vital Sign    Days of Exercise per Week: 4 days    Minutes of Exercise per Session: 40 min  Stress: No Stress Concern Present (11/09/2023)   [employer] of Occupational Health - Occupational Stress Questionnaire    Feeling of Stress : Not at all  Social Connections: Socially Integrated (11/09/2023)   Social Connection and Isolation Panel    Frequency of Communication with Friends and Family: More than three times a week    Frequency of Social Gatherings with Friends and Family: More than three times a week    Attends Religious Services: More than 4 times per year    Active Member of [employer] or Organizations: Yes    Attends Museum/gallery Exhibitions Officer: More than 4 times per year    Marital Status: Married  Catering Manager Violence: Not on file    Outpatient Medications Prior to Visit  Medication Sig Dispense Refill   Multiple Vitamin (MULTIVITAMIN) tablet Take 1 tablet by mouth daily.     Omega-3 Fatty Acids (FISH OIL PO) Take 1 capsule by mouth daily.     pregabalin  (LYRICA ) 100 MG capsule Take 1 capsule by mouth twice daily 180 capsule 0   No facility-administered medications prior to visit.    No Known Allergies  Review of Systems  Constitutional:  Negative for fever and malaise/fatigue.  HENT:  Negative for congestion.   Eyes:  Negative for blurred vision.  Respiratory:  Negative for shortness of breath.   Cardiovascular:  Negative for chest pain, palpitations and leg swelling.  Gastrointestinal:  Negative for abdominal pain, blood in stool and nausea.  Genitourinary:  Negative for dysuria and frequency.  Musculoskeletal:  Negative for falls.  Skin:  Negative for rash.  Neurological:  Negative for dizziness, loss of consciousness and headaches.  Endo/Heme/Allergies:  Negative for environmental allergies.  Psychiatric/Behavioral:  Negative for depression. The patient is not nervous/anxious.        Objective:    Physical Exam Vitals and nursing note reviewed.  Constitutional:      General: She is not in acute distress.    Appearance: Normal appearance. She is well-developed.  HENT:     Head: Normocephalic and atraumatic.  Eyes:     General: No scleral icterus.       Right eye: No discharge.        Left eye: No discharge.  Cardiovascular:     Rate and Rhythm: Normal rate and regular rhythm.     Heart sounds: No murmur heard. Pulmonary:     Effort: Pulmonary effort is normal. No respiratory distress.     Breath sounds: Normal breath sounds.  Musculoskeletal:        General: Normal range of motion.     Cervical back: Normal range of motion and neck supple.     Right lower leg: No edema.      Left lower leg: No edema.  Skin:    General: Skin is warm and dry.  Neurological:     Mental Status: She is alert and oriented to person, place, and time.  Psychiatric:        Mood and Affect: Mood normal.        Behavior: Behavior normal.  Thought Content: Thought content normal.        Judgment: Judgment normal.     BP (!) 142/86 (BP Location: Left Arm, Patient Position: Sitting, Cuff Size: Normal)   Pulse (!) 55   Temp 97.9 F (36.6 C) (Oral)   Resp 16   Ht 5' 5 (1.651 m)   Wt 158 lb (71.7 kg)   LMP 04/01/2016   SpO2 99%   BMI 26.29 kg/m  Wt Readings from Last 3 Encounters:  07/19/24 158 lb (71.7 kg)  11/09/23 159 lb (72.1 kg)  12/29/22 149 lb 9.6 oz (67.9 kg)    Diabetic Foot Exam - Simple   No data filed    Lab Results  Component Value Date   WBC 2.0 Repeated and verified X2. (L) 11/09/2023   HGB 11.9 (L) 11/09/2023   HCT 36.7 11/09/2023   PLT 128.0 (L) 11/09/2023   GLUCOSE 85 11/09/2023   CHOL 167 11/09/2023   TRIG 62.0 11/09/2023   HDL 58.80 11/09/2023   LDLCALC 95 11/09/2023   ALT 18 11/09/2023   AST 18 11/09/2023   NA 140 11/09/2023   K 4.5 11/09/2023   CL 104 11/09/2023   CREATININE 0.96 11/09/2023   BUN 14 11/09/2023   CO2 31 11/09/2023   TSH 0.51 11/09/2023   INR 1.1 03/15/2008   HGBA1C 5.3 09/05/2016   MICROALBUR <0.2 09/22/2014    Lab Results  Component Value Date   TSH 0.51 11/09/2023   Lab Results  Component Value Date   WBC 2.0 Repeated and verified X2. (L) 11/09/2023   HGB 11.9 (L) 11/09/2023   HCT 36.7 11/09/2023   MCV 85.8 11/09/2023   PLT 128.0 (L) 11/09/2023   Lab Results  Component Value Date   NA 140 11/09/2023   K 4.5 11/09/2023   CO2 31 11/09/2023   GLUCOSE 85 11/09/2023   BUN 14 11/09/2023   CREATININE 0.96 11/09/2023   BILITOT 0.7 11/09/2023   ALKPHOS 47 11/09/2023   AST 18 11/09/2023   ALT 18 11/09/2023   PROT 7.6 11/09/2023   ALBUMIN 4.2 11/09/2023   CALCIUM 9.4 11/09/2023   ANIONGAP 1 (L)  05/22/2020   GFR 62.66 11/09/2023   Lab Results  Component Value Date   CHOL 167 11/09/2023   Lab Results  Component Value Date   HDL 58.80 11/09/2023   Lab Results  Component Value Date   LDLCALC 95 11/09/2023   Lab Results  Component Value Date   TRIG 62.0 11/09/2023   Lab Results  Component Value Date   CHOLHDL 3 11/09/2023   Lab Results  Component Value Date   HGBA1C 5.3 09/05/2016       Assessment & Plan:  Need for influenza vaccination -     Flu vaccine trivalent PF, 6mos and older(Flulaval,Afluria,Fluarix,Fluzone)  Trigeminal neuralgia -     Pregabalin ; Take 1 capsule (150 mg total) by mouth 2 (two) times daily.  Dispense: 180 capsule; Refill: 1  Assessment and Plan Assessment & Plan Trigeminal neuralgia   Chronic trigeminal neuralgia persists with increased pain despite the current Lyrica  regimen of 100 mg twice daily. She prefers not to take medication three times daily due to adherence issues. Increase Lyrica  to 150 mg twice daily. Instructed to take one 100 mg tablet in the morning and two 100 mg tablets at night until the current supply is finished.  General Health Maintenance   She was due for a flu vaccination and received the flu shot.    Jackie Casey  R Antonio Meth, DO

## 2024-11-11 ENCOUNTER — Encounter: Admitting: Family Medicine

## 2024-12-01 ENCOUNTER — Encounter: Admitting: Family Medicine
